# Patient Record
Sex: Female | Born: 1950 | Race: Black or African American | Hispanic: No | Smoking: Never smoker
Health system: Southern US, Community
[De-identification: ages and names within clinical notes are randomized; demographics above are authoritative.]

## PROBLEM LIST (undated history)

## (undated) DIAGNOSIS — T7840XA Allergy, unspecified, initial encounter: Secondary | ICD-10-CM

## (undated) DIAGNOSIS — I82402 Acute embolism and thrombosis of unspecified deep veins of left lower extremity: Secondary | ICD-10-CM

## (undated) DIAGNOSIS — I4891 Unspecified atrial fibrillation: Secondary | ICD-10-CM

## (undated) DIAGNOSIS — I10 Essential (primary) hypertension: Secondary | ICD-10-CM

## (undated) DIAGNOSIS — F419 Anxiety disorder, unspecified: Secondary | ICD-10-CM

## (undated) DIAGNOSIS — M5136 Other intervertebral disc degeneration, lumbar region: Secondary | ICD-10-CM

## (undated) DIAGNOSIS — E7800 Pure hypercholesterolemia, unspecified: Secondary | ICD-10-CM

## (undated) DIAGNOSIS — M51369 Other intervertebral disc degeneration, lumbar region without mention of lumbar back pain or lower extremity pain: Secondary | ICD-10-CM

## (undated) HISTORY — PX: PANCREAS SURGERY: SHX731

## (undated) HISTORY — PX: OTHER SURGICAL HISTORY: SHX169

## (undated) HISTORY — DX: Allergy, unspecified, initial encounter: T78.40XA

## (undated) HISTORY — DX: Acute embolism and thrombosis of unspecified deep veins of left lower extremity: I82.402

## (undated) HISTORY — DX: Anxiety disorder, unspecified: F41.9

---

## 2004-03-02 ENCOUNTER — Ambulatory Visit: Payer: Self-pay | Admitting: Internal Medicine

## 2004-03-03 ENCOUNTER — Ambulatory Visit: Payer: Self-pay | Admitting: Family Medicine

## 2004-03-07 ENCOUNTER — Ambulatory Visit: Payer: Self-pay | Admitting: *Deleted

## 2006-05-01 ENCOUNTER — Ambulatory Visit (HOSPITAL_COMMUNITY): Admission: RE | Admit: 2006-05-01 | Discharge: 2006-05-01 | Payer: Self-pay | Admitting: Family Medicine

## 2007-01-19 ENCOUNTER — Emergency Department (HOSPITAL_COMMUNITY): Admission: EM | Admit: 2007-01-19 | Discharge: 2007-01-19 | Payer: Self-pay | Admitting: Emergency Medicine

## 2007-11-20 ENCOUNTER — Emergency Department (HOSPITAL_COMMUNITY): Admission: EM | Admit: 2007-11-20 | Discharge: 2007-11-20 | Payer: Self-pay | Admitting: Emergency Medicine

## 2007-11-28 ENCOUNTER — Ambulatory Visit (HOSPITAL_COMMUNITY): Admission: RE | Admit: 2007-11-28 | Discharge: 2007-11-28 | Payer: Self-pay | Admitting: Family Medicine

## 2010-11-07 LAB — COMPREHENSIVE METABOLIC PANEL
ALT: 18
AST: 25
Albumin: 4.2
Alkaline Phosphatase: 122 — ABNORMAL HIGH
Calcium: 9.2
GFR calc non Af Amer: >60
Potassium: 3.2 — ABNORMAL LOW
Sodium: 140
Total Protein: 7.2

## 2010-11-07 LAB — URINALYSIS, ROUTINE W REFLEX MICROSCOPIC
Bilirubin Urine: NEGATIVE
Leukocytes, UA: NEGATIVE
Nitrite: NEGATIVE
Protein, ur: NEGATIVE
Urobilinogen, UA: 0.2
pH: 7.5

## 2010-11-07 LAB — CBC
HCT: 37.4
MCHC: 34.2
RDW: 13.1

## 2010-11-07 LAB — DIFFERENTIAL
Basophils Absolute: 0
Eosinophils Absolute: 0
Lymphocytes Relative: 18
Lymphs Abs: 1.7

## 2010-11-07 LAB — GLUCOSE, CAPILLARY

## 2010-11-07 LAB — URINE MICROSCOPIC-ADD ON

## 2010-11-14 LAB — DIFFERENTIAL
Eosinophils Absolute: 0 — ABNORMAL LOW
Lymphocytes Relative: 17
Monocytes Absolute: 0.4
Monocytes Relative: 4
Neutro Abs: 6.7

## 2010-11-14 LAB — BASIC METABOLIC PANEL
BUN: 11
CO2: 29
Calcium: 9.1
Creatinine, Ser: 0.76
GFR calc non Af Amer: >60
Glucose, Bld: 131 — ABNORMAL HIGH
Potassium: 3.2 — ABNORMAL LOW
Sodium: 138

## 2010-11-14 LAB — CBC
Hemoglobin: 12.1
MCV: 94.3
Platelets: 239
WBC: 8.7

## 2010-12-21 ENCOUNTER — Emergency Department (HOSPITAL_COMMUNITY)
Admission: EM | Admit: 2010-12-21 | Discharge: 2010-12-21 | Disposition: A | Discharge status: Home / Self Care | Payer: Self-pay | Attending: Emergency Medicine | Admitting: Emergency Medicine

## 2010-12-21 ENCOUNTER — Encounter: Payer: Self-pay | Admitting: *Deleted

## 2010-12-21 ENCOUNTER — Other Ambulatory Visit: Payer: Self-pay

## 2010-12-21 DIAGNOSIS — R42 Dizziness and giddiness: Secondary | ICD-10-CM | POA: Insufficient documentation

## 2010-12-21 DIAGNOSIS — E785 Hyperlipidemia, unspecified: Secondary | ICD-10-CM | POA: Insufficient documentation

## 2010-12-21 DIAGNOSIS — I10 Essential (primary) hypertension: Secondary | ICD-10-CM | POA: Insufficient documentation

## 2010-12-21 DIAGNOSIS — Z7982 Long term (current) use of aspirin: Secondary | ICD-10-CM | POA: Insufficient documentation

## 2010-12-21 HISTORY — DX: Pure hypercholesterolemia, unspecified: E78.00

## 2010-12-21 HISTORY — DX: Essential (primary) hypertension: I10

## 2010-12-21 LAB — DIFFERENTIAL
Basophils Relative: 0 % (ref 0–1)
Eosinophils Absolute: 0 10*3/uL (ref 0.0–0.7)
Lymphocytes Relative: 9 % — ABNORMAL LOW (ref 12–46)
Monocytes Relative: 4 % (ref 3–12)

## 2010-12-21 LAB — CBC
Hemoglobin: 12.2 g/dL (ref 12.0–15.0)
MCV: 96.3 fL (ref 78.0–100.0)

## 2010-12-21 LAB — COMPREHENSIVE METABOLIC PANEL
Albumin: 3.8 g/dL (ref 3.5–5.2)
Alkaline Phosphatase: 111 U/L (ref 39–117)
CO2: 30 mEq/L (ref 19–32)
Calcium: 9.7 mg/dL (ref 8.4–10.5)
GFR calc Af Amer: >90 mL/min (ref >90)
Glucose, Bld: 109 mg/dL — ABNORMAL HIGH (ref 70–99)
Potassium: 3.4 mEq/L — ABNORMAL LOW (ref 3.5–5.1)
Sodium: 138 mEq/L (ref 135–145)
Total Bilirubin: 0.7 mg/dL (ref 0.3–1.2)

## 2010-12-21 LAB — CK TOTAL AND CKMB (NOT AT ARMC): CK, MB: 2.5 ng/mL (ref 0.3–4.0)

## 2010-12-21 MED ORDER — MECLIZINE HCL 12.5 MG PO TABS
25.0000 mg | ORAL_TABLET | Freq: Once | ORAL | Status: AC
Start: 2010-12-21 — End: 2010-12-21
  Administered 2010-12-21: 25 mg via ORAL
  Filled 2010-12-21: qty 2

## 2010-12-21 MED ORDER — MECLIZINE HCL 50 MG PO TABS: 25.0000 mg | ORAL_TABLET | Freq: Three times a day (TID) | ORAL | Status: AC | PRN

## 2010-12-21 NOTE — ED Notes (Signed)
Pt states she fell this morning; pt states she lost her balance and fell; pt states she still feels like she is going to pass out now and she feels nauseous

## 2010-12-21 NOTE — ED Provider Notes (Signed)
History  This chart was scribed for Crystal Lyons, MD by Clarita Crane. The patient was seen in room APA11/APA11 and the patient's care was started at 11:04AM.  CSN: [id number] Arrival date & time: 12/21/2010 10:30 AM   First MD Initiated Contact with Patient 12/21/10 1057      Chief Complaint  Patient presents with  . Near Syncope    HPI  Crystal Cooke is a 60 y.o. female who presents to the Emergency Department complaining of near syncope that ocurred about 2 hours ago and lasted for about 10 minutes before resolving. She stated that she had just hung up the phone when she had a sudden onset of dizziness described as the room spinnnig, blurred vision, nausea and chills. She stated that she "collapsed to her knees and couldn't get up". She continued on to say that she crawled to her medicine drawer and took her blood pressure medication as well as an Asprin before crawling to her father's bedroom where she pulled herself up using a side of his bed. She stated that she had a headache prior to going to bed last night which she took 2 Tylenol for, but she denies any other associated symptoms such as tinnitus, loss of hearing, vomiting, diarrhea, or cold symptoms. She confirmed that she has a history of syncope and hyperlipidemia; however, she is noncompliant with taking her medication.     Past Medical History  Diagnosis Date  . Hypertension   . Hypercholesteremia     History reviewed. No pertinent past surgical history.  History reviewed. No pertinent family history.  History  Substance Use Topics  . Smoking status: Never Smoker   . Smokeless tobacco: Not on file  . Alcohol Use: No    OB History    Grav Para Term Preterm Abortions TAB SAB Ect Mult Living                  Review of Systems A complete 10 system review of systems was obtained and is otherwise negative except as noted in the HPI.  Allergies  Review of patient's allergies indicates no known allergies.  Home  Medications   Current Outpatient Rx  Name Route Sig Dispense Refill  . ACETAMINOPHEN 500 MG PO TABS Oral Take 1,000 mg by mouth every 6 (six) hours as needed. Pain     . ASPIRIN EC 81 MG PO TBEC Oral Take 81 mg by mouth daily.      Marland Kitchen DILTIAZEM HCL 120 MG PO TABS Oral Take 120 mg by mouth daily.      Marland Kitchen FISH OIL 1000 MG PO CAPS Oral Take 1 capsule by mouth daily.        BP 146/83  Pulse 84  Temp(Src) 98.4 F (36.9 C) (Oral)  Resp 20  Ht 5\' 2"  (1.575 m)  Wt 130 lb (58.968 kg)  BMI 23.78 kg/m2  SpO2 100%  Physical Exam  Nursing note and vitals reviewed. Constitutional: She is oriented to person, place, and time. She appears well-developed and well-nourished. No distress.  HENT:  Head: Normocephalic and atraumatic.  Right Ear: Tympanic membrane and external ear normal.  Left Ear: Tympanic membrane and external ear normal.  Mouth/Throat: Oropharynx is clear and moist.  Eyes: EOM are normal. Pupils are equal, round, and reactive to light.  Neck: Normal range of motion. Neck supple. No tracheal deviation present.  Cardiovascular: Normal rate and regular rhythm.  Exam reveals no gallop.   No murmur heard. Pulmonary/Chest: Effort normal and  breath sounds normal. No respiratory distress. She has no wheezes. She has no rales.       Clear bilaterally  Abdominal: She exhibits no distension.  Musculoskeletal: Normal range of motion.  Lymphadenopathy:    She has no cervical adenopathy.  Neurological: She is alert and oriented to person, place, and time. No cranial nerve deficit.  Skin: Skin is warm and dry.  Psychiatric: She has a normal mood and affect. Her behavior is normal.    ED Course  Procedures (including critical care time)  DIAGNOSTIC STUDIES: Oxygen Saturation is 100% on room air, normal by my interpretation.    COORDINATION OF CARE: 11:10AM- Discussed possible vertigo diagnosis, lab order and possible discharge with patient at bedside. Patient agreed to plan.   Labs  Reviewed  CBC - Abnormal; Notable for the following:    RBC 3.78 (*)    All other components within normal limits  DIFFERENTIAL - Abnormal; Notable for the following:    Neutrophils Relative 87 (*)    Lymphocytes Relative 9 (*)    All other components within normal limits  COMPREHENSIVE METABOLIC PANEL - Abnormal; Notable for the following:    Potassium 3.4 (*)    Glucose, Bld 109 (*)    GFR calc non Af Amer 88 (*)    All other components within normal limits  CK TOTAL AND CKMB  TROPONIN I   No results found.   No diagnosis found.   Date: 12/21/2010  Rate: 77  Rhythm: normal sinus rhythm  QRS Axis: normal  Intervals: normal  ST/T Wave abnormalities: normal  Conduction Disutrbances:none  Narrative Interpretation:   Old EKG Reviewed: unchanged    MDM  Symptoms sound like vertigo.  Labs, ekg okay.     I personally performed the services described in this documentation, which was scribed in my presence. The recorded information has been reviewed and considered.        Crystal Lyons, MD 12/21/10 [phone]

## 2011-08-08 ENCOUNTER — Other Ambulatory Visit (HOSPITAL_COMMUNITY): Payer: Self-pay | Admitting: Nurse Practitioner

## 2011-08-08 DIAGNOSIS — Z139 Encounter for screening, unspecified: Secondary | ICD-10-CM

## 2011-08-15 ENCOUNTER — Ambulatory Visit (HOSPITAL_COMMUNITY): Payer: Self-pay

## 2011-08-18 ENCOUNTER — Ambulatory Visit (HOSPITAL_COMMUNITY): Payer: Self-pay

## 2011-08-21 ENCOUNTER — Ambulatory Visit (HOSPITAL_COMMUNITY)
Admission: RE | Admit: 2011-08-21 | Discharge: 2011-08-21 | Disposition: A | Discharge status: Home / Self Care | Payer: PRIVATE HEALTH INSURANCE | Source: Ambulatory Visit | Attending: Nurse Practitioner | Admitting: Nurse Practitioner

## 2011-08-21 DIAGNOSIS — Z1231 Encounter for screening mammogram for malignant neoplasm of breast: Secondary | ICD-10-CM | POA: Insufficient documentation

## 2011-08-21 DIAGNOSIS — Z139 Encounter for screening, unspecified: Secondary | ICD-10-CM

## 2012-04-21 ENCOUNTER — Emergency Department (HOSPITAL_COMMUNITY)
Admission: EM | Admit: 2012-04-21 | Discharge: 2012-04-21 | Disposition: A | Discharge status: Home / Self Care | Payer: Self-pay | Attending: Emergency Medicine | Admitting: Emergency Medicine

## 2012-04-21 ENCOUNTER — Emergency Department (HOSPITAL_COMMUNITY): Payer: Self-pay

## 2012-04-21 ENCOUNTER — Encounter (HOSPITAL_COMMUNITY): Payer: Self-pay

## 2012-04-21 DIAGNOSIS — I10 Essential (primary) hypertension: Secondary | ICD-10-CM | POA: Insufficient documentation

## 2012-04-21 DIAGNOSIS — R079 Chest pain, unspecified: Secondary | ICD-10-CM

## 2012-04-21 DIAGNOSIS — R0789 Other chest pain: Secondary | ICD-10-CM | POA: Insufficient documentation

## 2012-04-21 DIAGNOSIS — E7800 Pure hypercholesterolemia, unspecified: Secondary | ICD-10-CM | POA: Insufficient documentation

## 2012-04-21 DIAGNOSIS — Z79899 Other long term (current) drug therapy: Secondary | ICD-10-CM | POA: Insufficient documentation

## 2012-04-21 DIAGNOSIS — R51 Headache: Secondary | ICD-10-CM | POA: Insufficient documentation

## 2012-04-21 DIAGNOSIS — Z7982 Long term (current) use of aspirin: Secondary | ICD-10-CM | POA: Insufficient documentation

## 2012-04-21 LAB — CBC WITH DIFFERENTIAL/PLATELET
Basophils Absolute: 0 K/uL (ref 0.0–0.1)
Basophils Relative: 0 % (ref 0–1)
Eosinophils Absolute: 0 K/uL (ref 0.0–0.7)
Eosinophils Relative: 1 % (ref 0–5)
HCT: 37.5 % (ref 36.0–46.0)
Hemoglobin: 12.8 g/dL (ref 12.0–15.0)
Lymphocytes Relative: 42 % (ref 12–46)
Lymphs Abs: 1.9 K/uL (ref 0.7–4.0)
MCH: 32.2 pg (ref 26.0–34.0)
MCHC: 34.1 g/dL (ref 30.0–36.0)
MCV: 94.5 fL (ref 78.0–100.0)
Monocytes Absolute: 0.3 K/uL (ref 0.1–1.0)
Monocytes Relative: 6 % (ref 3–12)
Neutro Abs: 2.4 K/uL (ref 1.7–7.7)
Neutrophils Relative %: 51 % (ref 43–77)
Platelets: 215 K/uL (ref 150–400)
RBC: 3.97 MIL/uL (ref 3.87–5.11)
RDW: 12.3 % (ref 11.5–15.5)
WBC: 4.7 K/uL (ref 4.0–10.5)

## 2012-04-21 NOTE — ED Notes (Addendum)
Pt presents with hypertension. Pt took BP at home and it was elevated. Pt took medicine and BP went down. Pt also stating that she was feelings some dull pain and discomfort in her chest.

## 2012-04-21 NOTE — ED Provider Notes (Signed)
History  This chart was scribed for Vida Roller, MD by Bennett Scrape, ED Scribe. This patient was seen in room APA06/APA06 and the patient's care was started at 4:24 PM.  CSN: [id number]  Arrival date & time 04/21/12  1417   First MD Initiated Contact with Patient 04/21/12 1624      Chief Complaint  Patient presents with  . Hypertension    The history is provided by the patient. No language interpreter was used.    Crystal Cooke is a 62 y.o. female with a h/o HTN who presents to the Emergency Department complaining of gradual onset, waxing and waning HTN with associated slight HA and chest heaviness that she noticed today. Pt states that she measured her BP at 177/96, pulse was 74. She reports that she then took her cardizem medication and took her BP again one hour later with a systolic BP of 94. She then took her BP again one hour later with a systolic BP of 106. She then decided to come to the ED for evaluation. She denies having a HA currently and reports that the chest heaviness has improved. She reports that she has just recently started taking her BP at home with a wrist machine. She has been on Cardizem medication for 10 years and she denies being on any other medications. She states that when she follows up with the health clinic for her PB she is told that her BP is normal. She denies having a h/o MI, CVA and kidney problems. She denies fever, leg swelling, nausea, emesis and dizziness as associated symptoms. She denies smoking and alcohol use.  Past Medical History  Diagnosis Date  . Hypertension   . Hypercholesteremia     History reviewed. No pertinent past surgical history.  No family history on file.  History  Substance Use Topics  . Smoking status: Never Smoker   . Smokeless tobacco: Not on file  . Alcohol Use: No    No OB history provided.  Review of Systems  Constitutional: Negative for fever.  Respiratory: Positive for chest tightness. Negative for  cough.   Cardiovascular: Negative for leg swelling.  Gastrointestinal: Negative for nausea, vomiting and diarrhea.  Neurological: Positive for headaches. Negative for dizziness.  All other systems reviewed and are negative.    Allergies  Review of patient's allergies indicates no known allergies.  Home Medications   Current Outpatient Rx  Name  Route  Sig  Dispense  Refill  . aspirin EC 81 MG tablet   Oral   Take 81 mg by mouth daily.           Marland Kitchen diltiazem (CARDIZEM) 120 MG tablet   Oral   Take 120 mg by mouth daily.           . Omega-3 Fatty Acids (FISH OIL) 1000 MG CAPS   Oral   Take 1 capsule by mouth daily.           Marland Kitchen acetaminophen (TYLENOL) 500 MG tablet   Oral   Take 1,000 mg by mouth every 6 (six) hours as needed. Pain            Triage Vitals: BP 174/95  Pulse 85  Temp(Src) 99 F (37.2 C)  Resp 16  Ht 5\' 1"  (1.549 m)  Wt 130 lb (58.968 kg)  BMI 24.58 kg/m2  SpO2 100%  Physical Exam  Nursing note and vitals reviewed. Constitutional: She is oriented to person, place, and time. She appears well-developed and  well-nourished.  Non-toxic appearance. She does not appear ill. No distress.  HENT:  Head: Normocephalic and atraumatic.  Nose: No mucosal edema or rhinorrhea.  Mouth/Throat: Oropharynx is clear and moist and mucous membranes are normal. No dental abscesses or edematous.  Eyes: Conjunctivae and EOM are normal. Pupils are equal, round, and reactive to light.  Neck: Normal range of motion and full passive range of motion without pain. Neck supple.  Cardiovascular: Normal rate, regular rhythm and normal heart sounds.  Exam reveals no gallop and no friction rub.   No murmur heard. Pulmonary/Chest: Effort normal and breath sounds normal. No respiratory distress. She has no wheezes. She has no rhonchi. She has no rales. She exhibits no tenderness and no crepitus.  Abdominal: Soft. Normal appearance and bowel sounds are normal. She exhibits no  distension. There is no tenderness. There is no rebound and no guarding.  Musculoskeletal: Normal range of motion. She exhibits no edema and no tenderness.  Moves all extremities well.   Neurological: She is alert and oriented to person, place, and time. She has normal strength. No cranial nerve deficit.  Skin: Skin is warm, dry and intact.  Psychiatric: She has a normal mood and affect. Her speech is normal and behavior is normal. Her mood appears not anxious.    ED Course  Procedures (including critical care time)  DIAGNOSTIC STUDIES: Oxygen Saturation is 100% on room air, normal by my interpretation.    COORDINATION OF CARE: 4:31 PM- Advised pt to relax and will retake BP. Informed pt that wrist BPs are usually inaccurate. Will have pt test with her wrist machine and compare with our BP cuff. Discussed treatment plan which includes CXR, CBC panel and troponin with pt at bedside and pt agreed to plan.   Labs Reviewed  CBC WITH DIFFERENTIAL  POCT I-STAT TROPONIN I   Dg Chest Port 1 View  04/21/2012  *RADIOLOGY REPORT*  Clinical Data: Chest pain.  Shortness of breath.  Current history of hypertension.  PORTABLE CHEST - 1 VIEW 04/21/2012 1637 hours:  Comparison: Two-view chest x-ray 11/20/2007.  Findings: Suboptimal inspiration accounts for mild atelectasis at the left lung base.  Lungs otherwise clear.  Cardiac silhouette upper normal in size for technique, unchanged.  Hilar and mediastinal contours otherwise unremarkable.  IMPRESSION: Suboptimal inspiration accounts for mild left basilar atelectasis. No acute cardiopulmonary disease otherwise.   Original Report Authenticated By: Hulan Saas, M.D.      1. Hypertension   2. Chest pain       MDM  On repeat exam, BP is 150/100 per tech, has normal labs and normal ECG - doubt cardiac crisis, htn is tolerable and can be rechecked at her PMD's no need for treatment in the ED, pt is well appearing and doubt ACS.  ED ECG REPORT  I  personally interpreted this EKG   Date: 04/21/2012   Rate: 78  Rhythm: normal sinus rhythm  QRS Axis: normal  Intervals: normal  ST/T Wave abnormalities: normal  Conduction Disutrbances:none  Narrative Interpretation:   Old EKG Reviewed: none available    I personally performed the services described in this documentation, which was scribed in my presence. The recorded information has been reviewed and is accurate.      Vida Roller, MD 04/21/12 [phone]

## 2012-04-22 LAB — POCT I-STAT, CHEM 8
BUN: 14 mg/dL (ref 6–23)
Chloride: 104 mEq/L (ref 96–112)
Creatinine, Ser: 0.9 mg/dL (ref 0.50–1.10)
Sodium: 142 mEq/L (ref 135–145)
TCO2: 29 mmol/L (ref 0–100)

## 2012-05-14 ENCOUNTER — Encounter (HOSPITAL_COMMUNITY): Payer: Self-pay | Admitting: *Deleted

## 2012-05-14 ENCOUNTER — Emergency Department (HOSPITAL_COMMUNITY)
Admission: EM | Admit: 2012-05-14 | Discharge: 2012-05-14 | Disposition: A | Discharge status: Home / Self Care | Payer: Self-pay | Attending: Emergency Medicine | Admitting: Emergency Medicine

## 2012-05-14 DIAGNOSIS — H1130 Conjunctival hemorrhage, unspecified eye: Secondary | ICD-10-CM | POA: Insufficient documentation

## 2012-05-14 DIAGNOSIS — Z79899 Other long term (current) drug therapy: Secondary | ICD-10-CM | POA: Insufficient documentation

## 2012-05-14 DIAGNOSIS — Z8639 Personal history of other endocrine, nutritional and metabolic disease: Secondary | ICD-10-CM | POA: Insufficient documentation

## 2012-05-14 DIAGNOSIS — I10 Essential (primary) hypertension: Secondary | ICD-10-CM | POA: Insufficient documentation

## 2012-05-14 DIAGNOSIS — R51 Headache: Secondary | ICD-10-CM | POA: Insufficient documentation

## 2012-05-14 DIAGNOSIS — H1131 Conjunctival hemorrhage, right eye: Secondary | ICD-10-CM

## 2012-05-14 DIAGNOSIS — Z7982 Long term (current) use of aspirin: Secondary | ICD-10-CM | POA: Insufficient documentation

## 2012-05-14 DIAGNOSIS — Z862 Personal history of diseases of the blood and blood-forming organs and certain disorders involving the immune mechanism: Secondary | ICD-10-CM | POA: Insufficient documentation

## 2012-05-14 LAB — GLUCOSE, CAPILLARY: Glucose-Capillary: 100 mg/dL — ABNORMAL HIGH (ref 70–99)

## 2012-05-14 NOTE — ED Notes (Signed)
Pt c/o of ha that started yesterday and redness to rt eye that was there when she woke this morning. Denies eye pain. Redness noted to sclera. No edema.

## 2012-05-14 NOTE — ED Provider Notes (Signed)
History     CSN: [id number]  Arrival date & time 05/14/12  9147   First MD Initiated Contact with Patient 05/14/12 0940      Chief Complaint  Patient presents with  . Eye Pain  . Headache    (Consider location/radiation/quality/duration/timing/severity/associated sxs/prior treatment) HPI Comments: Patient c/o generalized headache that began on the day prior to Ed arrival.  Described the headache as gradual in onset with a throbbing sensation around the top of her head.  States she took an OTC pain reliever and when she woke up this morning the headache had resolved, but states she noticed redness to the right eye and states she was concerned she might be having symptoms of a stroke.  She also admits to recent stressors in her life and not sleeping well.  She denies eye pain, visual changes, vomiting, neck pain, numbness or weakness of the face or extremities, speech difficulty or chest pain.  She also denies facial trauma or contact use.    Patient is a 62 y.o. female presenting with eye problem. The history is provided by the patient.  Eye Problem Location:  R eye Quality: no pain. Severity:  Mild Onset quality:  Sudden Duration: woke this morning with redness to her right eye. Timing:  Constant Progression:  Unchanged Chronicity:  New Context: not burn, not chemical exposure, not contact lens problem, not direct trauma, not foreign body, not using machinery, not scratch, not smoke exposure and not tanning booth use   Relieved by:  Nothing Worsened by:  Nothing tried Ineffective treatments:  None tried Associated symptoms: headaches and redness   Associated symptoms: no blurred vision, no crusting, no decreased vision, no discharge, no double vision, no facial rash, no foreign body sensation, no inflammation, no itching, no nausea, no numbness, no photophobia, no scotomas, no swelling, no tearing, no tingling, no vomiting and no weakness     Past Medical History  Diagnosis Date    . Hypertension   . Hypercholesteremia     History reviewed. No pertinent past surgical history.  No family history on file.  History  Substance Use Topics  . Smoking status: Never Smoker   . Smokeless tobacco: Not on file  . Alcohol Use: No    OB History   Grav Para Term Preterm Abortions TAB SAB Ect Mult Living                  Review of Systems  Constitutional: Negative for fever, activity change and appetite change.  HENT: Negative for facial swelling, trouble swallowing, neck pain and neck stiffness.   Eyes: Positive for redness. Negative for blurred vision, double vision, photophobia, pain, discharge, itching and visual disturbance.  Respiratory: Negative for chest tightness and shortness of breath.   Cardiovascular: Negative for chest pain.  Gastrointestinal: Negative for nausea and vomiting.  Skin: Negative for rash and wound.  Neurological: Positive for headaches. Negative for dizziness, tingling, syncope, facial asymmetry, speech difficulty, weakness, light-headedness and numbness.  Psychiatric/Behavioral: Negative for confusion and decreased concentration.  All other systems reviewed and are negative.    Allergies  Review of patient's allergies indicates no known allergies.  Home Medications   Current Outpatient Rx  Name  Route  Sig  Dispense  Refill  . acetaminophen (TYLENOL) 500 MG tablet   Oral   Take 1,000 mg by mouth every 6 (six) hours as needed. Pain          . aspirin EC 81 MG tablet  Oral   Take 81 mg by mouth daily.           Marland Kitchen diltiazem (CARDIZEM) 120 MG tablet   Oral   Take 120 mg by mouth daily.           . Omega-3 Fatty Acids (FISH OIL) 1000 MG CAPS   Oral   Take 1 capsule by mouth daily.             BP 116/90  Pulse 100  Temp(Src) 98.2 F (36.8 C) (Oral)  Resp 15  Ht 5\' 1"  (1.549 m)  Wt 130 lb (58.968 kg)  BMI 24.58 kg/m2  SpO2 96%  Physical Exam  Nursing note and vitals reviewed. Constitutional: She is  oriented to person, place, and time. She appears well-developed and well-nourished. No distress.  HENT:  Head: Normocephalic and atraumatic.  Mouth/Throat: Oropharynx is clear and moist.  Eyes: EOM are normal. Pupils are equal, round, and reactive to light. No foreign bodies found. Right eye exhibits no chemosis, no discharge and no exudate. No foreign body present in the right eye. Left eye exhibits no chemosis, no discharge and no exudate. No foreign body present in the left eye. Right conjunctiva has a hemorrhage.    Neck: Normal range of motion and phonation normal. Neck supple. No rigidity. No Brudzinski's sign and no Kernig's sign noted.  Cardiovascular: Normal rate, regular rhythm, normal heart sounds and intact distal pulses.   No murmur heard. Pulmonary/Chest: Effort normal and breath sounds normal.  Musculoskeletal: Normal range of motion.  Neurological: She is alert and oriented to person, place, and time. No cranial nerve deficit or sensory deficit. She exhibits normal muscle tone. Coordination and gait normal.  Reflex Scores:      Tricep reflexes are 2+ on the right side and 2+ on the left side.      Bicep reflexes are 2+ on the right side and 2+ on the left side. Skin: Skin is warm and dry.    ED Course  Procedures (including critical care time)  Labs Reviewed  GLUCOSE, CAPILLARY - Abnormal; Notable for the following:    Glucose-Capillary 100 (*)    All other components within normal limits        MDM    Vitals stable,  Pt is non-toxic appearing.  No visual changes, no focal neuro deficits, no meningeal signs.  Headache of gradual onset yesterday that is similar to previous and resolved this morning PTA. Has a subconjunctival hemorrhage to medial aspect of the sclera right eye . No visual deficits, EOM's intact.   Patient agrees to close f/u with PMD or to return here if the symptoms worsen.   The patient appears reasonably screened and/or stabilized for discharge  and I doubt any other medical condition or other [facility] requiring further screening, evaluation, or treatment in the ED at this time prior to discharge.       Yarieliz Wasser L. Trisha Mangle, PA-C 05/15/12 1956

## 2012-05-16 NOTE — ED Provider Notes (Signed)
Medical screening examination/treatment/procedure(s) were performed by non-physician practitioner and as supervising physician I was immediately available for consultation/collaboration.  Donnetta Hutching, MD 05/16/12 [phone]

## 2012-08-02 ENCOUNTER — Encounter (HOSPITAL_COMMUNITY): Payer: Self-pay | Admitting: *Deleted

## 2012-08-02 ENCOUNTER — Emergency Department (HOSPITAL_COMMUNITY)
Admission: EM | Admit: 2012-08-02 | Discharge: 2012-08-02 | Disposition: A | Discharge status: Home / Self Care | Payer: Self-pay | Attending: Emergency Medicine | Admitting: Emergency Medicine

## 2012-08-02 DIAGNOSIS — I10 Essential (primary) hypertension: Secondary | ICD-10-CM | POA: Insufficient documentation

## 2012-08-02 DIAGNOSIS — E7800 Pure hypercholesterolemia, unspecified: Secondary | ICD-10-CM | POA: Insufficient documentation

## 2012-08-02 DIAGNOSIS — Z7982 Long term (current) use of aspirin: Secondary | ICD-10-CM | POA: Insufficient documentation

## 2012-08-02 DIAGNOSIS — B029 Zoster without complications: Secondary | ICD-10-CM | POA: Insufficient documentation

## 2012-08-02 DIAGNOSIS — Z79899 Other long term (current) drug therapy: Secondary | ICD-10-CM | POA: Insufficient documentation

## 2012-08-02 MED ORDER — DEXAMETHASONE SODIUM PHOSPHATE 4 MG/ML IJ SOLN
8.0000 mg | Freq: Once | INTRAMUSCULAR | Status: AC
  Administered 2012-08-02: 8 mg via INTRAMUSCULAR
  Filled 2012-08-02: qty 2

## 2012-08-02 MED ORDER — HYDROCODONE-ACETAMINOPHEN 5-325 MG PO TABS: 1.0000 | ORAL_TABLET | ORAL | Status: DC | PRN

## 2012-08-02 MED ORDER — ACYCLOVIR 800 MG PO TABS
800.0000 mg | ORAL_TABLET | Freq: Once | ORAL | Status: AC
  Administered 2012-08-02: 800 mg via ORAL
  Filled 2012-08-02: qty 1

## 2012-08-02 MED ORDER — ONDANSETRON HCL 4 MG PO TABS
4.0000 mg | ORAL_TABLET | Freq: Once | ORAL | Status: DC
  Filled 2012-08-02: qty 1

## 2012-08-02 MED ORDER — ACYCLOVIR 800 MG PO TABS: 800.0000 mg | ORAL_TABLET | Freq: Every day | ORAL | Status: DC

## 2012-08-02 MED ORDER — DEXAMETHASONE 4 MG PO TABS: ORAL_TABLET | ORAL | Status: DC

## 2012-08-02 MED ORDER — HYDROCODONE-ACETAMINOPHEN 5-325 MG PO TABS
2.0000 | ORAL_TABLET | Freq: Once | ORAL | Status: DC
  Filled 2012-08-02: qty 2

## 2012-08-02 NOTE — ED Provider Notes (Signed)
Medical screening examination/treatment/procedure(s) were performed by non-physician practitioner and as supervising physician I was immediately available for consultation/collaboration.   Leighton Luster L Kester Stimpson, MD 08/02/12 1500 

## 2012-08-02 NOTE — ED Provider Notes (Signed)
History    CSN: [id number] Arrival date & time 08/02/12  1212  First MD Initiated Contact with Patient 08/02/12 1238     Chief Complaint  Patient presents with  . Rash   (Consider location/radiation/quality/duration/timing/severity/associated sxs/prior Treatment) Patient is a 62 y.o. female presenting with rash. The history is provided by the patient.  Rash Pain location: right forehead. Pain quality: aching and sharp   Pain quality comment:  Burning sensation Pain radiation: scalp. Pain severity:  Moderate Onset quality:  Gradual Duration:  5 days Timing:  Constant Progression:  Worsening Chronicity:  New Context: not diet changes, not recent travel and not sick contacts   Relieved by:  Nothing Worsened by:  Palpation Ineffective treatments: steroid cream. Associated symptoms: no chest pain, no chills, no cough, no dysuria, no fever, no hematuria and no shortness of breath    Past Medical History  Diagnosis Date  . Hypertension   . Hypercholesteremia    History reviewed. No pertinent past surgical history. History reviewed. No pertinent family history. History  Substance Use Topics  . Smoking status: Never Smoker   . Smokeless tobacco: Not on file  . Alcohol Use: No   OB History   Grav Para Term Preterm Abortions TAB SAB Ect Mult Living                 Review of Systems  Constitutional: Negative for fever, chills and activity change.       All ROS Neg except as noted in HPI  HENT: Negative for nosebleeds and neck pain.   Eyes: Negative for photophobia and discharge.  Respiratory: Negative for cough, shortness of breath and wheezing.   Cardiovascular: Negative for chest pain and palpitations.  Gastrointestinal: Negative for abdominal pain and blood in stool.  Genitourinary: Negative for dysuria, frequency and hematuria.  Musculoskeletal: Negative for back pain and arthralgias.  Skin: Positive for rash.  Neurological: Negative for dizziness, seizures and  speech difficulty.  Psychiatric/Behavioral: Negative for hallucinations and confusion.    Allergies  Review of patient's allergies indicates no known allergies.  Home Medications   Current Outpatient Rx  Name  Route  Sig  Dispense  Refill  . aspirin EC 81 MG tablet   Oral   Take 81 mg by mouth daily.           Marland Kitchen diltiazem (CARDIZEM) 120 MG tablet   Oral   Take 120 mg by mouth daily.           . Omega-3 Fatty Acids (FISH OIL) 1000 MG CAPS   Oral   Take 1 capsule by mouth daily.           Marland Kitchen sulfamethoxazole-trimethoprim (BACTRIM DS) 800-160 MG per tablet   Oral   Take 1 tablet by mouth 2 (two) times daily.          BP 125/76  Pulse 87  Temp(Src) 99.2 F (37.3 C) (Oral)  Resp 20  Ht 5\' 1"  (1.549 m)  Wt 130 lb (58.968 kg)  BMI 24.58 kg/m2  SpO2 99% Physical Exam  Nursing note and vitals reviewed. Constitutional: She is oriented to person, place, and time. She appears well-developed and well-nourished.  Non-toxic appearance.  HENT:  Head: Normocephalic.    Right Ear: Tympanic membrane and external ear normal.  Left Ear: Tympanic membrane and external ear normal.  Eyes: EOM and lids are normal. Pupils are equal, round, and reactive to light.  Neck: Normal range of motion. Neck supple. Carotid bruit is  not present.  Cardiovascular: Normal rate, regular rhythm, normal heart sounds, intact distal pulses and normal pulses.   Pulmonary/Chest: Breath sounds normal. No respiratory distress.  Abdominal: Soft. Bowel sounds are normal. There is no tenderness. There is no guarding.  Musculoskeletal: Normal range of motion.  Lymphadenopathy:       Head (right side): No submandibular adenopathy present.       Head (left side): No submandibular adenopathy present.    She has no cervical adenopathy.  Neurological: She is alert and oriented to person, place, and time. She has normal strength. No cranial nerve deficit or sensory deficit.  Skin: Skin is warm and dry.    Psychiatric: She has a normal mood and affect. Her speech is normal.    ED Course  Procedures (including critical care time) Labs Reviewed - No data to display No results found. No diagnosis found.  MDM  I have reviewed nursing notes, vital signs, and all appropriate lab and imaging results for this patient. Examination is c/w herpes zoster. Pt admits to chicken pox as a child. Plan - acyclovir, decadron, and norco. Follow up with MD at the clinic.  Kathie Dike, PA-C 08/02/12 1352

## 2012-08-02 NOTE — ED Notes (Addendum)
Pt says she was bitten by mosquito on Sunday, seen at health dept on Wed, Cont to have rash and pain.Also started being treated for uti

## 2012-08-02 NOTE — ED Notes (Signed)
Pt c/o rash and pain to right side of facial area started Sunday after being bitten by insect while mowing the yard, was seen at health department, given hydrocortisone and medication for uti. Pt states that the uti symptoms are better.

## 2012-08-12 ENCOUNTER — Emergency Department (HOSPITAL_COMMUNITY)
Admission: EM | Admit: 2012-08-12 | Discharge: 2012-08-12 | Disposition: A | Discharge status: Home / Self Care | Payer: Self-pay | Attending: Emergency Medicine | Admitting: Emergency Medicine

## 2012-08-12 ENCOUNTER — Encounter (HOSPITAL_COMMUNITY): Payer: Self-pay | Admitting: *Deleted

## 2012-08-12 DIAGNOSIS — J029 Acute pharyngitis, unspecified: Secondary | ICD-10-CM | POA: Insufficient documentation

## 2012-08-12 DIAGNOSIS — Z79899 Other long term (current) drug therapy: Secondary | ICD-10-CM | POA: Insufficient documentation

## 2012-08-12 DIAGNOSIS — T375X5A Adverse effect of antiviral drugs, initial encounter: Secondary | ICD-10-CM | POA: Insufficient documentation

## 2012-08-12 DIAGNOSIS — E7800 Pure hypercholesterolemia, unspecified: Secondary | ICD-10-CM | POA: Insufficient documentation

## 2012-08-12 DIAGNOSIS — Z789 Other specified health status: Secondary | ICD-10-CM

## 2012-08-12 DIAGNOSIS — R220 Localized swelling, mass and lump, head: Secondary | ICD-10-CM | POA: Insufficient documentation

## 2012-08-12 DIAGNOSIS — I10 Essential (primary) hypertension: Secondary | ICD-10-CM | POA: Insufficient documentation

## 2012-08-12 DIAGNOSIS — K1370 Unspecified lesions of oral mucosa: Secondary | ICD-10-CM | POA: Insufficient documentation

## 2012-08-12 DIAGNOSIS — Z7982 Long term (current) use of aspirin: Secondary | ICD-10-CM | POA: Insufficient documentation

## 2012-08-12 MED ORDER — NYSTATIN 100000 UNIT/ML MT SUSP: OROMUCOSAL | Status: DC

## 2012-08-12 MED ORDER — DIPHENHYDRAMINE HCL 25 MG PO TABS: 25.0000 mg | ORAL_TABLET | Freq: Four times a day (QID) | ORAL | Status: DC

## 2012-08-12 NOTE — ED Notes (Signed)
Pt states she thinks she is having a medication reaction to acyclovir. Pt states she feels like she has a lump in her throat when she swallows, has white stuff in her mouth, and has been feeling lightheaded.

## 2012-08-12 NOTE — ED Provider Notes (Signed)
History    CSN: [id number] Arrival date & time 08/12/12  0016  First MD Initiated Contact with Patient 08/12/12 0038     Chief Complaint  Patient presents with  . Medication Reaction   (Consider location/radiation/quality/duration/timing/severity/associated sxs/prior Treatment) HPI Comments: Crystal Cooke is a 62 y.o. female who presents to the Emergency Department complaining of sore throat and rash to her mouth.  States she was seen here more than one week ago and treated with acyclovir and prednisone for shingles.  Patient states she believes she is having a reaction to those medications.  She states she never "broke out" with any rash and her previous symptoms have since resolved.  She now states she has "white stuff" in her throat and on her tongue with what she describes as a "lump" in her throat when she swallows.  She denies fever, vomiting, facial swelling, or difficulty swallowing fluids.    The history is provided by the patient.   Past Medical History  Diagnosis Date  . Hypertension   . Hypercholesteremia    History reviewed. No pertinent past surgical history. History reviewed. No pertinent family history. History  Substance Use Topics  . Smoking status: Never Smoker   . Smokeless tobacco: Not on file  . Alcohol Use: No   OB History   Grav Para Term Preterm Abortions TAB SAB Ect Mult Living                 Review of Systems  Constitutional: Negative for fever, chills, activity change and appetite change.  HENT: Positive for sore throat and mouth sores. Negative for ear pain, congestion, facial swelling, drooling, trouble swallowing, neck pain, neck stiffness and voice change.   Eyes: Negative for pain and visual disturbance.  Respiratory: Negative for cough, chest tightness, shortness of breath and stridor.   Gastrointestinal: Negative for nausea, vomiting and abdominal pain.  Musculoskeletal: Negative for arthralgias.  Skin: Negative for color change and  rash.  Neurological: Negative for dizziness, facial asymmetry, speech difficulty, numbness and headaches.  Hematological: Negative for adenopathy.  All other systems reviewed and are negative.    Allergies  Review of patient's allergies indicates no known allergies.  Home Medications   Current Outpatient Rx  Name  Route  Sig  Dispense  Refill  . acyclovir (ZOVIRAX) 800 MG tablet   Oral   Take 1 tablet (800 mg total) by mouth 5 (five) times daily.   35 tablet   0   . HYDROcodone-acetaminophen (NORCO/VICODIN) 5-325 MG per tablet   Oral   Take 1 tablet by mouth every 4 (four) hours as needed for pain.   20 tablet   0   . aspirin EC 81 MG tablet   Oral   Take 81 mg by mouth daily.           Marland Kitchen diltiazem (CARDIZEM) 120 MG tablet   Oral   Take 120 mg by mouth daily.           . Omega-3 Fatty Acids (FISH OIL) 1000 MG CAPS   Oral   Take 1 capsule by mouth daily.           Marland Kitchen sulfamethoxazole-trimethoprim (BACTRIM DS) 800-160 MG per tablet   Oral   Take 1 tablet by mouth 2 (two) times daily.          BP 127/81  Temp(Src) 98.3 F (36.8 C) (Oral)  Resp 20  Ht 5\' 1"  (1.549 m)  Wt 130 lb (58.968 kg)  BMI 24.58 kg/m2  SpO2 98% Physical Exam  Nursing note and vitals reviewed. Constitutional: She is oriented to person, place, and time. She appears well-developed and well-nourished. No distress.  HENT:  Head: Normocephalic and atraumatic. No trismus in the jaw.  Right Ear: Tympanic membrane and ear canal normal.  Left Ear: Tympanic membrane and ear canal normal.  Mouth/Throat: Uvula is midline and mucous membranes are normal. No edematous. Posterior oropharyngeal erythema present. No posterior oropharyngeal edema or tonsillar abscesses.  Erythema and lengthening of the uvula.  No edema.  Uvula remains midline.  White exudate to the tongue and buccal mucosa that is easily scraped away with tongue blade  Neck: Normal range of motion, full passive range of motion without  pain and phonation normal. Neck supple.  Cardiovascular: Normal rate, regular rhythm, normal heart sounds and intact distal pulses.   No murmur heard. Pulmonary/Chest: Effort normal and breath sounds normal. No stridor. No respiratory distress. She has no wheezes. She has no rales.  Musculoskeletal: Normal range of motion.  Lymphadenopathy:    She has no cervical adenopathy.  Neurological: She is alert and oriented to person, place, and time. She exhibits normal muscle tone. Coordination normal.  Skin: Skin is warm and dry.    ED Course  Procedures (including critical care time) Labs Reviewed - No data to display    MDM   Previous ED chart reviewed.    Patient seen here 9 days ago and treated with decadron and acyclovir for zoster.  Tonight has erythema and thrush to the oral mucosa with lengthening of the uvula.  Uvula is NOT edematous.  Airway is patent, VSS.  NO Zoster lesions  She appears stable for discharge, advised to d/c the medication, will prescribe nystatin swish and swallow and benadryl.  She agrees to return here if needed.    Sayre Witherington L. Trisha Mangle, PA-C 08/14/12 2130

## 2012-08-17 NOTE — ED Provider Notes (Signed)
Medical screening examination/treatment/procedure(s) were performed by non-physician practitioner and as supervising physician I was immediately available for consultation/collaboration.  Joanathan Affeldt S. Jewelz Ricklefs, MD 08/17/12 0501 

## 2012-09-22 ENCOUNTER — Emergency Department (HOSPITAL_COMMUNITY): Payer: Self-pay

## 2012-09-22 ENCOUNTER — Encounter (HOSPITAL_COMMUNITY): Payer: Self-pay | Admitting: Emergency Medicine

## 2012-09-22 ENCOUNTER — Emergency Department (HOSPITAL_COMMUNITY)
Admission: EM | Admit: 2012-09-22 | Discharge: 2012-09-22 | Disposition: A | Discharge status: Home / Self Care | Payer: Self-pay | Attending: Emergency Medicine | Admitting: Emergency Medicine

## 2012-09-22 DIAGNOSIS — N23 Unspecified renal colic: Secondary | ICD-10-CM | POA: Insufficient documentation

## 2012-09-22 DIAGNOSIS — N201 Calculus of ureter: Secondary | ICD-10-CM | POA: Insufficient documentation

## 2012-09-22 DIAGNOSIS — Z7982 Long term (current) use of aspirin: Secondary | ICD-10-CM | POA: Insufficient documentation

## 2012-09-22 DIAGNOSIS — Z79899 Other long term (current) drug therapy: Secondary | ICD-10-CM | POA: Insufficient documentation

## 2012-09-22 DIAGNOSIS — R350 Frequency of micturition: Secondary | ICD-10-CM | POA: Insufficient documentation

## 2012-09-22 DIAGNOSIS — M5137 Other intervertebral disc degeneration, lumbosacral region: Secondary | ICD-10-CM | POA: Insufficient documentation

## 2012-09-22 DIAGNOSIS — I4891 Unspecified atrial fibrillation: Secondary | ICD-10-CM | POA: Insufficient documentation

## 2012-09-22 DIAGNOSIS — M51379 Other intervertebral disc degeneration, lumbosacral region without mention of lumbar back pain or lower extremity pain: Secondary | ICD-10-CM | POA: Insufficient documentation

## 2012-09-22 DIAGNOSIS — I10 Essential (primary) hypertension: Secondary | ICD-10-CM | POA: Insufficient documentation

## 2012-09-22 DIAGNOSIS — E7800 Pure hypercholesterolemia, unspecified: Secondary | ICD-10-CM | POA: Insufficient documentation

## 2012-09-22 HISTORY — DX: Unspecified atrial fibrillation: I48.91

## 2012-09-22 HISTORY — DX: Other intervertebral disc degeneration, lumbar region without mention of lumbar back pain or lower extremity pain: M51.369

## 2012-09-22 HISTORY — DX: Other intervertebral disc degeneration, lumbar region: M51.36

## 2012-09-22 LAB — URINALYSIS W MICROSCOPIC + REFLEX CULTURE
Glucose, UA: NEGATIVE mg/dL
Ketones, ur: NEGATIVE mg/dL
Leukocytes, UA: NEGATIVE
Nitrite: NEGATIVE
Specific Gravity, Urine: 1.02 (ref 1.005–1.030)
pH: 6.5 (ref 5.0–8.0)

## 2012-09-22 MED ORDER — ACETAMINOPHEN 500 MG PO TABS
1000.0000 mg | ORAL_TABLET | Freq: Once | ORAL | Status: AC
  Administered 2012-09-22: 1000 mg via ORAL
  Filled 2012-09-22: qty 2

## 2012-09-22 MED ORDER — NAPROXEN 250 MG PO TABS: 250.0000 mg | ORAL_TABLET | Freq: Two times a day (BID) | ORAL | Status: DC

## 2012-09-22 MED ORDER — OXYCODONE-ACETAMINOPHEN 5-325 MG PO TABS: ORAL_TABLET | ORAL | Status: DC

## 2012-09-22 MED ORDER — IBUPROFEN 400 MG PO TABS
400.0000 mg | ORAL_TABLET | Freq: Once | ORAL | Status: AC
  Administered 2012-09-22: 400 mg via ORAL
  Filled 2012-09-22: qty 1

## 2012-09-22 MED ORDER — BACITRACIN ZINC 500 UNIT/GM EX OINT
TOPICAL_OINTMENT | CUTANEOUS | Status: AC
  Filled 2012-09-22: qty 0.9

## 2012-09-22 MED ORDER — ONDANSETRON HCL 4 MG PO TABS: 4.0000 mg | ORAL_TABLET | Freq: Three times a day (TID) | ORAL | Status: DC | PRN

## 2012-09-22 NOTE — ED Provider Notes (Signed)
CSN: [id number]     Arrival date & time 09/22/12  1325 History     First MD Initiated Contact with Patient 09/22/12 1337     Chief Complaint  Patient presents with  . Back Pain    HPI Pt was seen at 1335. Per pt, c/o gradual onset and persistence of constant right sided low back "pain" for the past 1 week.  Pain worsens with palpation of the area and body position changes. Has been associated with urinary frequency. Denies dysuria/hematuria, no vaginal bleeding/discharge.  Denies incont/retention of bowel or bladder, no saddle anesthesia, no focal motor weakness, no tingling/numbness in extremities, no fevers, no injury, no abd pain.     Past Medical History  Diagnosis Date  . Hypertension   . Hypercholesteremia   . Atrial fibrillation   . DDD (degenerative disc disease), lumbar    History reviewed. No pertinent past surgical history.  History  Substance Use Topics  . Smoking status: Never Smoker   . Smokeless tobacco: Not on file  . Alcohol Use: No    Review of Systems ROS: Statement: All systems negative except as marked or noted in the HPI; Constitutional: Negative for fever and chills. ; ; Eyes: Negative for eye pain, redness and discharge. ; ; ENMT: Negative for ear pain, hoarseness, nasal congestion, sinus pressure and sore throat. ; ; Cardiovascular: Negative for chest pain, palpitations, diaphoresis, dyspnea and peripheral edema. ; ; Respiratory: Negative for cough, wheezing and stridor. ; ; Gastrointestinal: Negative for nausea, vomiting, diarrhea, abdominal pain, blood in stool, hematemesis, jaundice and rectal bleeding. . ; ; Genitourinary: +urinary frequency. Negative for dysuria, flank pain and hematuria. ; ; Musculoskeletal: +LBP. Negative for neck pain. Negative for swelling and trauma.; ; Skin: Negative for pruritus, rash, abrasions, blisters, bruising and skin lesion.; ; Neuro: Negative for headache, lightheadedness and neck stiffness. Negative for weakness, altered  level of consciousness , altered mental status, extremity weakness, paresthesias, involuntary movement, seizure and syncope.     Allergies  Review of patient's allergies indicates no known allergies.  Home Medications   Current Outpatient Rx  Name  Route  Sig  Dispense  Refill  . aspirin EC 81 MG tablet   Oral   Take 81 mg by mouth daily.           Marland Kitchen diltiazem (CARDIZEM) 120 MG tablet   Oral   Take 120 mg by mouth daily.           . Omega-3 Fatty Acids (FISH OIL) 1000 MG CAPS   Oral   Take 1 capsule by mouth daily.            BP 136/86  Pulse 70  Temp(Src) 98.2 F (36.8 C) (Oral)  Resp 20  Ht 5\' 1"  (1.549 m)  Wt 120 lb (54.432 kg)  BMI 22.69 kg/m2  SpO2 100% Physical Exam 1340: Physical examination:  Nursing notes reviewed; Vital signs and O2 SAT reviewed;  Constitutional: Well developed, Well nourished, Well hydrated, In no acute distress; Head:  Normocephalic, atraumatic; Eyes: EOMI, PERRL, No scleral icterus; ENMT: Mouth and pharynx normal, Mucous membranes moist; Neck: Supple, Full range of motion, No lymphadenopathy; Cardiovascular: Regular rate and rhythm, No gallop; Respiratory: Breath sounds clear & equal bilaterally, No rales, rhonchi, wheezes.  Speaking full sentences with ease, Normal respiratory effort/excursion; Chest: Nontender, Movement normal; Abdomen: Soft, Nontender, Nondistended, Normal bowel sounds; Genitourinary: No CVA tenderness; Spine:  No midline CS, TS, LS tenderness. +TTP right lower lumbar paraspinal muscles.;; Extremities:  Pulses normal, No tenderness, No edema, No calf edema or asymmetry.; Neuro: AA&Ox3, Major CN grossly intact.  Speech clear. Climbs on and off chair easily by herself. Gait steady. No gross focal motor or sensory deficits in extremities.; Skin: Color normal, Warm, Dry.   ED Course   Procedures   MDM  MDM Reviewed: previous chart, nursing note and vitals Interpretation: labs   Results for orders placed during the  hospital encounter of 09/22/12  URINALYSIS W MICROSCOPIC + REFLEX CULTURE      Result Value Range   Color, Urine YELLOW  YELLOW   APPearance CLEAR  CLEAR   Specific Gravity, Urine 1.020  1.005 - 1.030   pH 6.5  5.0 - 8.0   Glucose, UA NEGATIVE  NEGATIVE mg/dL   Hgb urine dipstick MODERATE (*) NEGATIVE   Bilirubin Urine NEGATIVE  NEGATIVE   Ketones, ur NEGATIVE  NEGATIVE mg/dL   Protein, ur NEGATIVE  NEGATIVE mg/dL   Urobilinogen, UA 0.2  0.0 - 1.0 mg/dL   Nitrite NEGATIVE  NEGATIVE   Leukocytes, UA NEGATIVE  NEGATIVE   WBC, UA 0-2  <3 WBC/hpf   RBC / HPF 11-20  <3 RBC/hpf   Bacteria, UA FEW (*) RARE   Squamous Epithelial / LPF FEW (*) RARE   Urine-Other FEW YEAST     Ct Abdomen Pelvis Wo Contrast 09/22/2012   *RADIOLOGY REPORT*  Clinical Data: back pain and hematuria  CT ABDOMEN AND PELVIS WITHOUT CONTRAST  Technique:  Multidetector CT imaging of the abdomen and pelvis was performed following the standard protocol without intravenous contrast.  Comparison: None.  Findings: Visualized lung bases are clear.  There are no acute musculoskeletal abnormalities but there is grade 1 anterior listhesis of L4 on L5 which appears to be due to significant degenerative facet disease at this level.  There is also significant degenerative facet disease at L5 S1.  There are numerous low attenuation round and oval liver lesions. The largest shows an average attenuation value of zero, and is seen on the right, measuring approximately 11 cm.  All of these lesions appear consistent with cysts of varying sizes involving both the left and right lobes of the liver.  The spleen is normal. Gallbladder and pancreas are normal.  Adrenal glands are normal. Left kidney is normal.  There is mild dilatation of the renal pelvis and proximal ureter on the right.  There is a 2 mm stone image number 39 in the proximal right ureter.  Just distal to this is another punctate approximately 1 mm stone within the right ureter on image  number 42.  The calcifications seen along the course of the ureter on image number 49, measuring about 3 mm, appears to represent a fleet bolus more distally.  Bladder and reproductive organs are normal.  There is no free fluid.  Bowel appears normal.  Appendix is normal.  IMPRESSION: Mild hydronephrosis due to two tiny stones in the proximal right ureter.  Numerous large hepatic cysts.   Original Report Authenticated By: Esperanza Heir, M.D.   Dg Lumbar Spine Complete 09/22/2012   *RADIOLOGY REPORT*  Clinical Data: Right flank pain, low back pain  LUMBAR SPINE - COMPLETE 4+ VIEW  Comparison: None.  Findings: Five views of the lumbar spine submitted.  No acute fracture.  There is  disc space flattening at L5-S1 level. About 3 mm anterolisthesis L5 on S1 vertebral body.  Significant stool are noted in the right colon and transverse colon.  Gas and stool noted  in the distal transverse and left colon.  IMPRESSION: No acute fracture. There is disc space flattening at L5-S1 level. About 3 mm anterolisthesis L5 on S1 vertebral body.  Significant stool noted in the right colon.  Gas and stool noted in distal colon.   Original Report Authenticated By: Natasha Mead, M.D.    1600:  Pt with 2 small ureteral calculi; will tx symptomatically at this time. No UTI on Udip. Pt wants to go home now. Pain controlled after meds. Tol PO well without N/V while in the ED. Dx and testing d/w pt.  Questions answered.  Verb understanding, agreeable to d/c home with outpt f/u.    Laray Anger, DO 09/24/12 1233

## 2012-09-22 NOTE — ED Notes (Signed)
Right side lower back pain with urinary freq x 1 week. Nad.

## 2013-08-14 ENCOUNTER — Other Ambulatory Visit (HOSPITAL_COMMUNITY): Payer: Self-pay | Admitting: *Deleted

## 2013-08-14 DIAGNOSIS — Z139 Encounter for screening, unspecified: Secondary | ICD-10-CM

## 2013-08-18 ENCOUNTER — Ambulatory Visit (HOSPITAL_COMMUNITY)
Admission: RE | Admit: 2013-08-18 | Discharge: 2013-08-18 | Disposition: A | Discharge status: Home / Self Care | Payer: PRIVATE HEALTH INSURANCE | Source: Ambulatory Visit | Attending: *Deleted | Admitting: *Deleted

## 2013-08-18 DIAGNOSIS — Z139 Encounter for screening, unspecified: Secondary | ICD-10-CM

## 2013-08-18 DIAGNOSIS — Z1231 Encounter for screening mammogram for malignant neoplasm of breast: Secondary | ICD-10-CM | POA: Insufficient documentation

## 2013-08-30 ENCOUNTER — Encounter (HOSPITAL_COMMUNITY): Payer: Self-pay | Admitting: Emergency Medicine

## 2013-08-30 ENCOUNTER — Emergency Department (HOSPITAL_COMMUNITY)
Admission: EM | Admit: 2013-08-30 | Discharge: 2013-08-30 | Disposition: A | Discharge status: Home / Self Care | Payer: Self-pay | Attending: Emergency Medicine | Admitting: Emergency Medicine

## 2013-08-30 DIAGNOSIS — I4891 Unspecified atrial fibrillation: Secondary | ICD-10-CM | POA: Insufficient documentation

## 2013-08-30 DIAGNOSIS — Z79899 Other long term (current) drug therapy: Secondary | ICD-10-CM | POA: Insufficient documentation

## 2013-08-30 DIAGNOSIS — I10 Essential (primary) hypertension: Secondary | ICD-10-CM | POA: Insufficient documentation

## 2013-08-30 DIAGNOSIS — Z7982 Long term (current) use of aspirin: Secondary | ICD-10-CM | POA: Insufficient documentation

## 2013-08-30 DIAGNOSIS — R350 Frequency of micturition: Secondary | ICD-10-CM | POA: Insufficient documentation

## 2013-08-30 DIAGNOSIS — R42 Dizziness and giddiness: Secondary | ICD-10-CM | POA: Insufficient documentation

## 2013-08-30 DIAGNOSIS — Z8639 Personal history of other endocrine, nutritional and metabolic disease: Secondary | ICD-10-CM | POA: Insufficient documentation

## 2013-08-30 DIAGNOSIS — Z8739 Personal history of other diseases of the musculoskeletal system and connective tissue: Secondary | ICD-10-CM | POA: Insufficient documentation

## 2013-08-30 DIAGNOSIS — Z862 Personal history of diseases of the blood and blood-forming organs and certain disorders involving the immune mechanism: Secondary | ICD-10-CM | POA: Insufficient documentation

## 2013-08-30 LAB — I-STAT CHEM 8, ED
BUN: 10 mg/dL (ref 6–23)
Calcium, Ion: 1.16 mmol/L (ref 1.13–1.30)
Chloride: 109 mEq/L (ref 96–112)
Creatinine, Ser: 0.8 mg/dL (ref 0.50–1.10)
Glucose, Bld: 113 mg/dL — ABNORMAL HIGH (ref 70–99)
HCT: 40 % (ref 36.0–46.0)
HEMOGLOBIN: 13.6 g/dL (ref 12.0–15.0)
POTASSIUM: 3.4 meq/L — AB (ref 3.7–5.3)
SODIUM: 142 meq/L (ref 137–147)
TCO2: 25 mmol/L (ref 0–100)

## 2013-08-30 LAB — URINE MICROSCOPIC-ADD ON

## 2013-08-30 LAB — URINALYSIS, ROUTINE W REFLEX MICROSCOPIC
Bilirubin Urine: NEGATIVE
Glucose, UA: NEGATIVE mg/dL
Ketones, ur: NEGATIVE mg/dL
Nitrite: NEGATIVE
Protein, ur: NEGATIVE mg/dL
Specific Gravity, Urine: 1.013 (ref 1.005–1.030)
UROBILINOGEN UA: 0.2 mg/dL (ref 0.0–1.0)
pH: 7 (ref 5.0–8.0)

## 2013-08-30 MED ORDER — DILTIAZEM HCL ER 120 MG PO CP24
120.0000 mg | ORAL_CAPSULE | Freq: Once | ORAL | Status: AC
  Administered 2013-08-30: 120 mg via ORAL
  Filled 2013-08-30: qty 1

## 2013-08-30 MED ORDER — MECLIZINE HCL 25 MG PO TABS
25.0000 mg | ORAL_TABLET | Freq: Once | ORAL | Status: AC
  Administered 2013-08-30: 25 mg via ORAL
  Filled 2013-08-30: qty 1

## 2013-08-30 NOTE — ED Notes (Signed)
Pt reports getting up to check on her grandson and she felt the room spinning. Pt denies any LOC. Pt reports not being able to walk straight, and could not control herself. Pt has hx of HTN, states she is unsure if she took her medication last night. Vitals by EMS 196/108, 18 resp, 84 HR, 97%.

## 2013-08-30 NOTE — Discharge Instructions (Signed)

## 2013-08-30 NOTE — ED Provider Notes (Signed)
CSN: [id number]     Arrival date & time 08/30/13  0508 History   First MD Initiated Contact with Patient 08/30/13 [phone]     Chief Complaint  Patient presents with  . Dizziness      Patient is a 63 y.o. female presenting with dizziness. The history is provided by the patient.  Dizziness Quality:  Room spinning Severity:  Moderate Onset quality:  Sudden Duration: just prior to arrival. Timing:  Constant Progression:  Improving Chronicity:  New Relieved by:  Being still Worsened by:  Movement Associated symptoms: no chest pain, no headaches, no hearing loss, no palpitations, no shortness of breath, no syncope, no tinnitus, no vision changes and no vomiting   Pt reports she missed her BP meds yesterday She woke up in the night to check on grandchild and after standing reports room spinning It made it difficult to ambulate and she fell once but no LOC No head injury No HA reported No focal weakness No visual loss or hearing changes No diplopia She is now feeling improved No h/o CVA She thinks this is due to missing her BP meds  Past Medical History  Diagnosis Date  . Hypertension   . Hypercholesteremia   . Atrial fibrillation   . DDD (degenerative disc disease), lumbar    History reviewed. No pertinent past surgical history. No family history on file. History  Substance Use Topics  . Smoking status: Never Smoker   . Smokeless tobacco: Not on file  . Alcohol Use: No   OB History   Grav Para Term Preterm Abortions TAB SAB Ect Mult Living                 Review of Systems  Constitutional: Negative for fever.  HENT: Negative for hearing loss and tinnitus.   Eyes: Negative for visual disturbance.  Respiratory: Negative for shortness of breath.   Cardiovascular: Negative for chest pain, palpitations and syncope.  Gastrointestinal: Negative for vomiting and abdominal pain.  Genitourinary: Positive for frequency. Negative for dysuria.  Neurological: Positive for  dizziness. Negative for syncope, speech difficulty and headaches.  All other systems reviewed and are negative.     Allergies  Review of patient's allergies indicates no known allergies.  Home Medications   Prior to Admission medications   Medication Sig Start Date End Date Taking? Authorizing Provider  aspirin EC 81 MG tablet Take 81 mg by mouth daily.     Yes Historical Provider, MD  diltiazem (CARDIZEM) 120 MG tablet Take 120 mg by mouth daily.     Yes Historical Provider, MD  Omega-3 Fatty Acids (FISH OIL) 1000 MG CAPS Take 1 capsule by mouth daily.     Yes Historical Provider, MD   BP 165/93  Pulse 84  Temp(Src) 98.4 F (36.9 C) (Oral)  Resp 25  SpO2 100% Physical Exam CONSTITUTIONAL: Well developed/well nourished HEAD: Normocephalic/atraumatic EYES: EOMI/PERRL, no nystagmus, no ptosis,  ENMT: Mucous membranes moist. Bilateral TM clear/intact NECK: supple no meningeal signs, no bruits SPINE:entire spine nontender CV: S1/S2 noted, no murmurs/rubs/gallops noted LUNGS: Lungs are clear to auscultation bilaterally, no apparent distress ABDOMEN: soft, nontender, no rebound or guarding GU:no cva tenderness NEURO:Awake/alert, facies symmetric, no arm or leg drift is noted Equal 5/5 strength with shoulder abduction, elbow flex/extension, wrist flex/extension in upper extremities and equal hand grips bilaterally Equal 5/5 strength with hip flexion,knee flex/extension, foot dorsi/plantar flexion Cranial nerves 3/4/5/6/08/14/08/11/12 tested and intact Gait normal without ataxia No past pointing Sensation to light touch intact  in all extremities EXTREMITIES: pulses normal, full ROM SKIN: warm, color normal PSYCH: no abnormalities of mood noted   ED Course  Procedures   8:07 AM No signs of acute CVA Pt ambulatory She requests her home BP meds  Pt feels improved and requests d/c home She had no neuro deficits and was ambulatory Suspect peripheral vertigo Labs Review Labs  Reviewed  I-STAT CHEM 8, ED - Abnormal; Notable for the following:    Potassium 3.4 (*)    Glucose, Bld 113 (*)    All other components within normal limits  URINALYSIS, ROUTINE W REFLEX MICROSCOPIC     EKG Interpretation   Date/Time:  Saturday August 30 2013 08:19:46 EDT Ventricular Rate:  79 PR Interval:  171 QRS Duration: 89 QT Interval:  359 QTC Calculation: 411 R Axis:   41 Text Interpretation:  Sinus rhythm Abnormal R-wave progression, early  transition artifact noted Confirmed by Christy Gentles  MD, Elenore Rota ([id number]) on  08/30/2013 8:28:39 AM      MDM   Final diagnoses:  Vertigo    Nursing notes including past medical history and social history reviewed and considered in documentation Labs/vital reviewed and considered     Sharyon Cable, MD 08/30/13 1007

## 2013-08-31 ENCOUNTER — Emergency Department (HOSPITAL_COMMUNITY)
Admission: EM | Admit: 2013-08-31 | Discharge: 2013-08-31 | Disposition: A | Discharge status: Home / Self Care | Payer: Self-pay | Attending: Emergency Medicine | Admitting: Emergency Medicine

## 2013-08-31 ENCOUNTER — Encounter (HOSPITAL_COMMUNITY): Payer: Self-pay | Admitting: Emergency Medicine

## 2013-08-31 DIAGNOSIS — Z79899 Other long term (current) drug therapy: Secondary | ICD-10-CM | POA: Insufficient documentation

## 2013-08-31 DIAGNOSIS — Z862 Personal history of diseases of the blood and blood-forming organs and certain disorders involving the immune mechanism: Secondary | ICD-10-CM | POA: Insufficient documentation

## 2013-08-31 DIAGNOSIS — Z8639 Personal history of other endocrine, nutritional and metabolic disease: Secondary | ICD-10-CM | POA: Insufficient documentation

## 2013-08-31 DIAGNOSIS — I4891 Unspecified atrial fibrillation: Secondary | ICD-10-CM | POA: Insufficient documentation

## 2013-08-31 DIAGNOSIS — I10 Essential (primary) hypertension: Secondary | ICD-10-CM | POA: Insufficient documentation

## 2013-08-31 DIAGNOSIS — M51379 Other intervertebral disc degeneration, lumbosacral region without mention of lumbar back pain or lower extremity pain: Secondary | ICD-10-CM | POA: Insufficient documentation

## 2013-08-31 DIAGNOSIS — M5137 Other intervertebral disc degeneration, lumbosacral region: Secondary | ICD-10-CM | POA: Insufficient documentation

## 2013-08-31 DIAGNOSIS — Z7982 Long term (current) use of aspirin: Secondary | ICD-10-CM | POA: Insufficient documentation

## 2013-08-31 NOTE — ED Notes (Signed)
Patient seen yesterday at [facility] after she started becoming dizzy and had near syncopal episode. Per patient blood pressure was 197/108. Patient states she was given meclizine for the dizziness and diltiazem XR 120mg . Patient reports blood pressure still elevated even with medication and concerned she was going to "have another episode."

## 2013-08-31 NOTE — ED Provider Notes (Signed)
CSN: [id number]     Arrival date & time 08/31/13  7062 History   First MD Initiated Contact with Patient 08/31/13 [phone]     Chief Complaint  Patient presents with  . Hypertension     (Consider location/radiation/quality/duration/timing/severity/associated sxs/prior Treatment) The history is provided by the patient.   Crystal Cooke is a 63 y.o. female presenting for a recheck since she was seen yesterday at [facility] ed for dizziness felt to be due to vertigo. She has a history of htn and this was also elevated yesterday although had missed her morning dose of diltiazem prior to arriving at [facility] ED early yesterday morning.  Her blood pressure was treated she was advised to followup with her primary care Dr. this week.  She continued to check her blood pressure yesterday evening with each blood pressure being increasingly elevated, stating her last check before she went to bed was approximately 155/99.  She took an additional dose of her diltiazem, but then worried that her blood pressure will get too low and was afraid to take another dose this morning before further evaluation here.  She's had no further dizzy episodes.  She does describe slight vision blurriness when she first woke without current decreased acuity.  She denies headache,  nausea or vomiting.  She had a brief episode of right sided chest tightness this morning which has resolved.    Past Medical History  Diagnosis Date  . Hypertension   . Hypercholesteremia   . Atrial fibrillation   . DDD (degenerative disc disease), lumbar    History reviewed. No pertinent past surgical history. Family History  Problem Relation Age of Onset  . Cancer Mother   . Hypertension Mother   . Diabetes Father   . Hypertension Father    History  Substance Use Topics  . Smoking status: Never Smoker   . Smokeless tobacco: Never Used  . Alcohol Use: No   OB History   Grav Para Term Preterm Abortions TAB SAB Ect Mult Living   5 5 5       5       Review of Systems  Constitutional: Negative for fever.  HENT: Negative for congestion and sore throat.   Eyes: Positive for visual disturbance.  Respiratory: Negative for chest tightness and shortness of breath.   Cardiovascular: Negative for chest pain.  Gastrointestinal: Negative for nausea and abdominal pain.  Genitourinary: Negative.   Musculoskeletal: Negative for arthralgias, joint swelling and neck pain.  Skin: Negative.  Negative for rash and wound.  Neurological: Positive for dizziness. Negative for weakness, light-headedness, numbness and headaches.  Psychiatric/Behavioral: Negative.       Allergies  Statins  Home Medications   Prior to Admission medications   Medication Sig Start Date End Date Taking? Authorizing Provider  acetaminophen (TYLENOL) 500 MG tablet Take 1,000 mg by mouth every 6 (six) hours as needed for moderate pain or headache.   Yes Historical Provider, MD  aspirin EC 81 MG tablet Take 81 mg by mouth daily.     Yes Historical Provider, MD  diltiazem (CARDIZEM) 120 MG tablet Take 120 mg by mouth daily.     Yes Historical Provider, MD  Multiple Vitamin (MULTIVITAMIN WITH MINERALS) TABS tablet Take 1 tablet by mouth daily.   Yes Historical Provider, MD  Omega-3 Fatty Acids (FISH OIL) 1000 MG CAPS Take 1 capsule by mouth daily.     Yes Historical Provider, MD   BP 126/83  Pulse 69  Temp(Src) 98.5 F (36.9  C) (Oral)  Resp 15  Ht 5\' 1"  (1.549 m)  Wt 130 lb (58.968 kg)  BMI 24.58 kg/m2  SpO2 96% Physical Exam  Nursing note and vitals reviewed. Constitutional: She appears well-developed and well-nourished.  HENT:  Head: Normocephalic and atraumatic.  Eyes: Conjunctivae are normal.  Neck: Normal range of motion.  Cardiovascular: Normal rate, regular rhythm, normal heart sounds and intact distal pulses.   Pulmonary/Chest: Effort normal and breath sounds normal. She has no wheezes. She has no rales. She exhibits no tenderness.  Abdominal: Soft. Bowel  sounds are normal. There is no tenderness.  Musculoskeletal: Normal range of motion.  Neurological: She is alert.  Skin: Skin is warm and dry.  Psychiatric: Her mood appears anxious.    ED Course  Procedures (including critical care time) Labs Review Labs Reviewed - No data to display  Imaging Review No results found.   EKG Interpretation   Date/Time:  Sunday August 31 2013 10:11:33 EDT Ventricular Rate:  73 PR Interval:  166 QRS Duration: 88 QT Interval:  378 QTC Calculation: 416 R Axis:   27 Text Interpretation:  Normal sinus rhythm Normal ECG No significant change  since last tracing Confirmed by Christy Gentles  MD, DONALD ([id number]) on 08/31/2013  10:39:35 AM      MDM   Final diagnoses:  Essential hypertension    Pt was also seen by Dr. Christy Gentles during this visit.  She was asked to not check her bp the remainder of today, explained this can lead to anxiety and increase bp, pt understands.  She took her am dose of her diltiazem while here.  Advised f/u with her pcp tomorrow who can assess/determine if she would like pt to be placed on higher dosing vs adding new bp med.  Pt understands and agrees with plan.    Evalee Jefferson, PA-C 09/01/13 [phone]

## 2013-08-31 NOTE — ED Notes (Signed)
Pt reports hypertension, headache and blurred vision this morning. Pt was recently tx for HTN and given BP medication. Pt states symptoms had not improved so she came back to ED for recheck.

## 2013-08-31 NOTE — Discharge Instructions (Signed)
Hypertension °Hypertension, commonly called high blood pressure, is when the force of blood pumping through your arteries is too strong. Your arteries are the blood vessels that carry blood from your heart throughout your body. A blood pressure reading consists of a higher number over a lower number, such as 110/72. The higher number (systolic) is the pressure inside your arteries when your heart pumps. The lower number (diastolic) is the pressure inside your arteries when your heart relaxes. Ideally you want your blood pressure below 120/80. °Hypertension forces your heart to work harder to pump blood. Your arteries may become narrow or stiff. Having hypertension puts you at risk for heart disease, stroke, and other problems.  °RISK FACTORS °Some risk factors for high blood pressure are controllable. Others are not.  °Risk factors you cannot control include:  °· Race. You may be at higher risk if you are African American. °· Age. Risk increases with age. °· Gender. Men are at higher risk than women before age 45 years. After age 65, women are at higher risk than men. °Risk factors you can control include: °· Not getting enough exercise or physical activity. °· Being overweight. °· Getting too much fat, sugar, calories, or salt in your diet. °· Drinking too much alcohol. °SIGNS AND SYMPTOMS °Hypertension does not usually cause signs or symptoms. Extremely high blood pressure (hypertensive crisis) may cause headache, anxiety, shortness of breath, and nosebleed. °DIAGNOSIS  °To check if you have hypertension, your health care provider will measure your blood pressure while you are seated, with your arm held at the level of your heart. It should be measured at least twice using the same arm. Certain conditions can cause a difference in blood pressure between your right and left arms. A blood pressure reading that is higher than normal on one occasion does not mean that you need treatment. If one blood pressure reading  is high, ask your health care provider about having it checked again. °TREATMENT  °Treating high blood pressure includes making lifestyle changes and possibly taking medicine. Living a healthy lifestyle can help lower high blood pressure. You may need to change some of your habits. °Lifestyle changes may include: °· Following the DASH diet. This diet is high in fruits, vegetables, and whole grains. It is low in salt, red meat, and added sugars. °· Getting at least 2½ hours of brisk physical activity every week. °· Losing weight if necessary. °· Not smoking. °· Limiting alcoholic beverages. °· Learning ways to reduce stress. ° If lifestyle changes are not enough to get your blood pressure under control, your health care provider may prescribe medicine. You may need to take more than one. Work closely with your health care provider to understand the risks and benefits. °HOME CARE INSTRUCTIONS °· Have your blood pressure rechecked as directed by your health care provider.   °· Take medicines only as directed by your health care provider. Follow the directions carefully. Blood pressure medicines must be taken as prescribed. The medicine does not work as well when you skip doses. Skipping doses also puts you at risk for problems.   °· Do not smoke.   °· Monitor your blood pressure at home as directed by your health care provider.  °SEEK MEDICAL CARE IF:  °· You think you are having a reaction to medicines taken. °· You have recurrent headaches or feel dizzy. °· You have swelling in your ankles. °· You have trouble with your vision. °SEEK IMMEDIATE MEDICAL CARE IF: °· You develop a severe headache or confusion. °·   You have unusual weakness, numbness, or feel faint.  You have severe chest or abdominal pain.  You vomit repeatedly.  You have trouble breathing. MAKE SURE YOU:   Understand these instructions.  Will watch your condition.  Will get help right away if you are not doing well or get worse. Document  Released: 01/23/2005 Document Revised: 06/09/2013 Document Reviewed: 11/15/2012 [facility] Patient Information 2015 [place], [place]. This information is not intended to replace advice given to you by your health care provider. Make sure you discuss any questions you have with your health care provider.   Avoid taking your blood pressure at home today as advised by Dr. Christy Gentles.  This can increase anxiety and cause elevation in blood pressure.  Return here for any development of symptoms as discussed.  Plan to see your doctor tomorrow for recheck of your blood pressure and consideration of adding additional blood pressure treatment.

## 2013-08-31 NOTE — ED Provider Notes (Signed)
Patient seen/examined in the Emergency Department in conjunction with Midlevel Provider [facility] Patient reports elevated blood pressure.  No dizziness or focal weakness reported Exam : awake/alert, no distress, conversant, well appearing Plan: discussed need to f/u with PCP tomorrow for repeat evaluation and may need new BP meds Seen yesterday by myself at [facility] for vertigo, she reports vertigo resolved.  I doubt acute CVA at this time   EKG Interpretation  Date/Time:  Sunday August 31 2013 10:11:33 EDT Ventricular Rate:  73 PR Interval:  166 QRS Duration: 88 QT Interval:  378 QTC Calculation: 416 R Axis:   27 Text Interpretation:  Normal sinus rhythm Normal ECG No significant change since last tracing Confirmed by Christy Gentles  MD, Oniyah Rohe ([id number]) on 08/31/2013 10:39:35 AM         Sharyon Cable, MD 08/31/13 1052

## 2013-09-02 NOTE — ED Provider Notes (Signed)
Medical screening examination/treatment/procedure(s) were conducted as a shared visit with non-physician practitioner(s) and myself.  I personally evaluated the patient during the encounter.   EKG Interpretation   Date/Time:  Sunday August 31 2013 10:11:33 EDT Ventricular Rate:  73 PR Interval:  166 QRS Duration: 88 QT Interval:  378 QTC Calculation: 416 R Axis:   27 Text Interpretation:  Normal sinus rhythm Normal ECG No significant change  since last tracing Confirmed by Christy Gentles  MD, Corrin Sieling ([id number]) on 08/31/2013  10:39:35 AM        Sharyon Cable, MD 09/02/13 [phone]

## 2013-09-14 ENCOUNTER — Emergency Department (HOSPITAL_COMMUNITY)
Admission: EM | Admit: 2013-09-14 | Discharge: 2013-09-14 | Disposition: A | Discharge status: Home / Self Care | Payer: Self-pay | Attending: Emergency Medicine | Admitting: Emergency Medicine

## 2013-09-14 ENCOUNTER — Encounter (HOSPITAL_COMMUNITY): Payer: Self-pay | Admitting: Emergency Medicine

## 2013-09-14 DIAGNOSIS — R002 Palpitations: Secondary | ICD-10-CM | POA: Insufficient documentation

## 2013-09-14 DIAGNOSIS — Z8739 Personal history of other diseases of the musculoskeletal system and connective tissue: Secondary | ICD-10-CM | POA: Insufficient documentation

## 2013-09-14 DIAGNOSIS — R0602 Shortness of breath: Secondary | ICD-10-CM | POA: Insufficient documentation

## 2013-09-14 DIAGNOSIS — Z7982 Long term (current) use of aspirin: Secondary | ICD-10-CM | POA: Insufficient documentation

## 2013-09-14 DIAGNOSIS — Z8639 Personal history of other endocrine, nutritional and metabolic disease: Secondary | ICD-10-CM | POA: Insufficient documentation

## 2013-09-14 DIAGNOSIS — Z79899 Other long term (current) drug therapy: Secondary | ICD-10-CM | POA: Insufficient documentation

## 2013-09-14 DIAGNOSIS — Z862 Personal history of diseases of the blood and blood-forming organs and certain disorders involving the immune mechanism: Secondary | ICD-10-CM | POA: Insufficient documentation

## 2013-09-14 DIAGNOSIS — I10 Essential (primary) hypertension: Secondary | ICD-10-CM | POA: Insufficient documentation

## 2013-09-14 LAB — BASIC METABOLIC PANEL
Anion gap: 13 (ref 5–15)
BUN: 12 mg/dL (ref 6–23)
CALCIUM: 9.8 mg/dL (ref 8.4–10.5)
CO2: 27 meq/L (ref 19–32)
Chloride: 103 mEq/L (ref 96–112)
Creatinine, Ser: 0.85 mg/dL (ref 0.50–1.10)
GFR calc Af Amer: 82 mL/min — ABNORMAL LOW (ref >90)
GFR calc non Af Amer: 71 mL/min — ABNORMAL LOW (ref >90)
GLUCOSE: 110 mg/dL — AB (ref 70–99)
Potassium: 3.5 mEq/L — ABNORMAL LOW (ref 3.7–5.3)
Sodium: 143 mEq/L (ref 137–147)

## 2013-09-14 LAB — CBC WITH DIFFERENTIAL/PLATELET
Basophils Absolute: 0 10*3/uL (ref 0.0–0.1)
Basophils Relative: 0 % (ref 0–1)
EOS ABS: 0 10*3/uL (ref 0.0–0.7)
EOS PCT: 0 % (ref 0–5)
HEMATOCRIT: 40.7 % (ref 36.0–46.0)
Hemoglobin: 14 g/dL (ref 12.0–15.0)
LYMPHS ABS: 1.3 10*3/uL (ref 0.7–4.0)
Lymphocytes Relative: 18 % (ref 12–46)
MCH: 32.2 pg (ref 26.0–34.0)
MCHC: 34.4 g/dL (ref 30.0–36.0)
MCV: 93.6 fL (ref 78.0–100.0)
MONO ABS: 0.4 10*3/uL (ref 0.1–1.0)
Monocytes Relative: 5 % (ref 3–12)
Neutro Abs: 5.5 10*3/uL (ref 1.7–7.7)
Neutrophils Relative %: 77 % (ref 43–77)
PLATELETS: 202 10*3/uL (ref 150–400)
RBC: 4.35 MIL/uL (ref 3.87–5.11)
RDW: 12.2 % (ref 11.5–15.5)
WBC: 7.3 10*3/uL (ref 4.0–10.5)

## 2013-09-14 LAB — TROPONIN I

## 2013-09-14 MED ORDER — VERAPAMIL HCL 80 MG PO TABS
80.0000 mg | ORAL_TABLET | Freq: Once | ORAL | Status: AC
  Administered 2013-09-14: 80 mg via ORAL
  Filled 2013-09-14: qty 1

## 2013-09-14 MED ORDER — VERAPAMIL HCL 80 MG PO TABS: 80.0000 mg | ORAL_TABLET | Freq: Three times a day (TID) | ORAL | Status: DC

## 2013-09-14 NOTE — ED Notes (Signed)
PT c/o high blood pressure for a few days. PT stated this morning she had an episode of her heart racing with dizziness and she was breathing fast. PT stated she took her bp medication at 0600 this morning.

## 2013-09-14 NOTE — Discharge Instructions (Signed)
Hypertension °Hypertension, commonly called high blood pressure, is when the force of blood pumping through your arteries is too strong. Your arteries are the blood vessels that carry blood from your heart throughout your body. A blood pressure reading consists of a higher number over a lower number, such as 110/72. The higher number (systolic) is the pressure inside your arteries when your heart pumps. The lower number (diastolic) is the pressure inside your arteries when your heart relaxes. Ideally you want your blood pressure below 120/80. °Hypertension forces your heart to work harder to pump blood. Your arteries may become narrow or stiff. Having hypertension puts you at risk for heart disease, stroke, and other problems.  °RISK FACTORS °Some risk factors for high blood pressure are controllable. Others are not.  °Risk factors you cannot control include:  °· Race. You may be at higher risk if you are African American. °· Age. Risk increases with age. °· Gender. Men are at higher risk than women before age 45 years. After age 65, women are at higher risk than men. °Risk factors you can control include: °· Not getting enough exercise or physical activity. °· Being overweight. °· Getting too much fat, sugar, calories, or salt in your diet. °· Drinking too much alcohol. °SIGNS AND SYMPTOMS °Hypertension does not usually cause signs or symptoms. Extremely high blood pressure (hypertensive crisis) may cause headache, anxiety, shortness of breath, and nosebleed. °DIAGNOSIS  °To check if you have hypertension, your health care provider will measure your blood pressure while you are seated, with your arm held at the level of your heart. It should be measured at least twice using the same arm. Certain conditions can cause a difference in blood pressure between your right and left arms. A blood pressure reading that is higher than normal on one occasion does not mean that you need treatment. If one blood pressure reading  is high, ask your health care provider about having it checked again. °TREATMENT  °Treating high blood pressure includes making lifestyle changes and possibly taking medicine. Living a healthy lifestyle can help lower high blood pressure. You may need to change some of your habits. °Lifestyle changes may include: °· Following the DASH diet. This diet is high in fruits, vegetables, and whole grains. It is low in salt, red meat, and added sugars. °· Getting at least 2½ hours of brisk physical activity every week. °· Losing weight if necessary. °· Not smoking. °· Limiting alcoholic beverages. °· Learning ways to reduce stress. ° If lifestyle changes are not enough to get your blood pressure under control, your health care provider may prescribe medicine. You may need to take more than one. Work closely with your health care provider to understand the risks and benefits. °HOME CARE INSTRUCTIONS °· Have your blood pressure rechecked as directed by your health care provider.   °· Take medicines only as directed by your health care provider. Follow the directions carefully. Blood pressure medicines must be taken as prescribed. The medicine does not work as well when you skip doses. Skipping doses also puts you at risk for problems.   °· Do not smoke.   °· Monitor your blood pressure at home as directed by your health care provider.  °SEEK MEDICAL CARE IF:  °· You think you are having a reaction to medicines taken. °· You have recurrent headaches or feel dizzy. °· You have swelling in your ankles. °· You have trouble with your vision. °SEEK IMMEDIATE MEDICAL CARE IF: °· You develop a severe headache or confusion. °·   You have unusual weakness, numbness, or feel faint.  You have severe chest or abdominal pain.  You vomit repeatedly.  You have trouble breathing. MAKE SURE YOU:   Understand these instructions.  Will watch your condition.  Will get help right away if you are not doing well or get worse. Document  Released: 01/23/2005 Document Revised: 06/09/2013 Document Reviewed: 11/15/2012 [facility] Patient Information 2015 [place], [place]. This information is not intended to replace advice given to you by your health care provider. Make sure you discuss any questions you have with your health care provider.   Start taking the new blood pressure medication in place of your diltiazem.  Follow up with your doctor as planned.

## 2013-09-14 NOTE — ED Provider Notes (Signed)
CSN: [id number]     Arrival date & time 09/14/13  7026 History   First MD Initiated Contact with Patient 09/14/13 1000     Chief Complaint  Patient presents with  . Hypertension     (Consider location/radiation/quality/duration/timing/severity/associated sxs/prior Treatment) HPI  Crystal Cooke is a 63 y.o. female presenting with complaints of high blood pressure.  She was seen here approximately 2 weeks ago with similar complaints.  She is currently taking diltiazem 120 mg immediate release tablets once daily and she has complaint of persistent elevation in her pressure.  She woke this morning with a short lived episode of heart palpitations, she felt lightheaded after having shortness of breath, both symptoms have resolved.  She took her bp and it was 175/99 at which time she started feeling sob and lightheaded,  Prompting her to think this was anxiety over her blood pressure causing her symptoms.  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  She often will take more than one of her ditiazem tablets throughout the day although it is prescribed once daily.  She denies chest pain, headache, peripheral edema.      Past Medical History  Diagnosis Date  . Hypertension   . Hypercholesteremia   . Atrial fibrillation   . DDD (degenerative disc disease), lumbar    History reviewed. No pertinent past surgical history. Family History  Problem Relation Age of Onset  . Cancer Mother   . Hypertension Mother   . Diabetes Father   . Hypertension Father    History  Substance Use Topics  . Smoking status: Never Smoker   . Smokeless tobacco: Never Used  . Alcohol Use: No   OB History   Grav Para Term Preterm Abortions TAB SAB Ect Mult Living   5 5 5       5      Review of Systems  Constitutional: Negative for fever and chills.  HENT: Negative for congestion and sore throat.   Eyes: Negative.   Respiratory: Positive for  shortness of breath. Negative for chest tightness.   Cardiovascular: Positive for palpitations. Negative for chest pain and leg swelling.  Gastrointestinal: Negative for nausea and abdominal pain.  Genitourinary: Negative.   Musculoskeletal: Negative for arthralgias, joint swelling and neck pain.  Skin: Negative.  Negative for rash and wound.  Neurological: Negative for dizziness, weakness, light-headedness, numbness and headaches.  Psychiatric/Behavioral: Negative.       Allergies  Statins  Home Medications   Prior to Admission medications   Medication Sig Start Date End Date Taking? Authorizing Provider  acetaminophen (TYLENOL) 500 MG tablet Take 1,000 mg by mouth every 6 (six) hours as needed for moderate pain or headache.   Yes Historical Provider, MD  aspirin EC 81 MG tablet Take 81 mg by mouth daily.     Yes Historical Provider, MD  Multiple Vitamin (MULTIVITAMIN WITH MINERALS) TABS tablet Take 1 tablet by mouth daily.   Yes Historical Provider, MD  Omega-3 Fatty Acids (FISH OIL) 1000 MG CAPS Take 1 capsule by mouth daily.     Yes Historical Provider, MD  Tetrahydrozoline HCl (VISINE EXTRA OP) Apply 2 drops to eye daily.   Yes Historical Provider, MD  verapamil (CALAN) 80 MG tablet Take 1 tablet (80 mg total) by mouth 3 (three) times daily. 09/14/13   Evalee Jefferson, PA-C   BP 129/82  Pulse 62  Temp(Src) 98.1 F (36.7 C) (Oral)  Resp 18  Ht 5\' 1"  (1.549 m)  Wt 130 lb (58.968 kg)  BMI 24.58 kg/m2  SpO2 100% Physical Exam  Nursing note and vitals reviewed. Constitutional: She appears well-developed and well-nourished.  HENT:  Head: Normocephalic and atraumatic.  Eyes: Conjunctivae are normal.  Neck: Normal range of motion.  Cardiovascular: Normal rate, regular rhythm, normal heart sounds and intact distal pulses.   Pulmonary/Chest: Effort normal and breath sounds normal. She has no wheezes. She exhibits no tenderness.  Abdominal: Soft. Bowel sounds are normal. There is no  tenderness.  Musculoskeletal: Normal range of motion.  Neurological: She is alert.  Skin: Skin is warm and dry.  Psychiatric: She has a normal mood and affect.    ED Course  Procedures (including critical care time) Labs Review Labs Reviewed  BASIC METABOLIC PANEL - Abnormal; Notable for the following:    Potassium 3.5 (*)    Glucose, Bld 110 (*)    GFR calc non Af Amer 71 (*)    GFR calc Af Amer 82 (*)    All other components within normal limits  CBC WITH DIFFERENTIAL  TROPONIN I    Imaging Review No results found.   EKG Interpretation None      MDM   Final diagnoses:  Essential hypertension    Patients labs and/or radiological studies were viewed and considered during the medical decision making and disposition process.  Labs stable,  Sx or lab findings suggesting end organ damage.  She was switched from diltiazem to verapamil 80 mg tid - this medicine is affordable for her, other options within the Ca channel blocker class, esp ER versions not on the [employer] $4 list.  This was discussed with her and she agrees with this plan.  She has a f/u with her pcp in 2 weeks and will f/u as scheduled.    Evalee Jefferson, PA-C 09/15/13 [phone]

## 2013-09-16 NOTE — ED Provider Notes (Signed)
Medical screening examination/treatment/procedure(s) were conducted as a shared visit with non-physician practitioner(s) and myself.  I personally evaluated the patient during the encounter.   EKG Interpretation None     Was switched to diltiazem 80 mg 3 times a day secondary to cost. Patient is hemodynamically stable.  Nat Christen, MD 09/16/13 1109

## 2013-09-17 ENCOUNTER — Encounter (HOSPITAL_COMMUNITY): Payer: Self-pay | Admitting: Emergency Medicine

## 2013-09-17 ENCOUNTER — Emergency Department (HOSPITAL_COMMUNITY)
Admission: EM | Admit: 2013-09-17 | Discharge: 2013-09-17 | Disposition: A | Discharge status: Home / Self Care | Payer: Self-pay | Attending: Emergency Medicine | Admitting: Emergency Medicine

## 2013-09-17 DIAGNOSIS — R42 Dizziness and giddiness: Secondary | ICD-10-CM | POA: Insufficient documentation

## 2013-09-17 DIAGNOSIS — Z79899 Other long term (current) drug therapy: Secondary | ICD-10-CM | POA: Insufficient documentation

## 2013-09-17 DIAGNOSIS — Z7982 Long term (current) use of aspirin: Secondary | ICD-10-CM | POA: Insufficient documentation

## 2013-09-17 DIAGNOSIS — M51379 Other intervertebral disc degeneration, lumbosacral region without mention of lumbar back pain or lower extremity pain: Secondary | ICD-10-CM | POA: Insufficient documentation

## 2013-09-17 DIAGNOSIS — M5137 Other intervertebral disc degeneration, lumbosacral region: Secondary | ICD-10-CM | POA: Insufficient documentation

## 2013-09-17 DIAGNOSIS — F411 Generalized anxiety disorder: Secondary | ICD-10-CM | POA: Insufficient documentation

## 2013-09-17 DIAGNOSIS — E7800 Pure hypercholesterolemia, unspecified: Secondary | ICD-10-CM | POA: Insufficient documentation

## 2013-09-17 DIAGNOSIS — I10 Essential (primary) hypertension: Secondary | ICD-10-CM | POA: Insufficient documentation

## 2013-09-17 LAB — CBC WITH DIFFERENTIAL/PLATELET
BASOS ABS: 0 10*3/uL (ref 0.0–0.1)
BASOS PCT: 0 % (ref 0–1)
EOS ABS: 0 10*3/uL (ref 0.0–0.7)
EOS PCT: 1 % (ref 0–5)
HCT: 38.2 % (ref 36.0–46.0)
Hemoglobin: 13 g/dL (ref 12.0–15.0)
Lymphocytes Relative: 23 % (ref 12–46)
Lymphs Abs: 1.5 10*3/uL (ref 0.7–4.0)
MCH: 32.1 pg (ref 26.0–34.0)
MCHC: 34 g/dL (ref 30.0–36.0)
MCV: 94.3 fL (ref 78.0–100.0)
MONOS PCT: 4 % (ref 3–12)
Monocytes Absolute: 0.3 10*3/uL (ref 0.1–1.0)
Neutro Abs: 4.7 10*3/uL (ref 1.7–7.7)
Neutrophils Relative %: 72 % (ref 43–77)
Platelets: 195 10*3/uL (ref 150–400)
RBC: 4.05 MIL/uL (ref 3.87–5.11)
RDW: 12.4 % (ref 11.5–15.5)
WBC: 6.5 10*3/uL (ref 4.0–10.5)

## 2013-09-17 LAB — BASIC METABOLIC PANEL
ANION GAP: 14 (ref 5–15)
BUN: 13 mg/dL (ref 6–23)
CHLORIDE: 102 meq/L (ref 96–112)
CO2: 26 mEq/L (ref 19–32)
CREATININE: 0.85 mg/dL (ref 0.50–1.10)
Calcium: 9.7 mg/dL (ref 8.4–10.5)
GFR calc Af Amer: 82 mL/min — ABNORMAL LOW (ref >90)
GFR, EST NON AFRICAN AMERICAN: 71 mL/min — AB (ref >90)
Glucose, Bld: 121 mg/dL — ABNORMAL HIGH (ref 70–99)
Potassium: 3.4 mEq/L — ABNORMAL LOW (ref 3.7–5.3)
Sodium: 142 mEq/L (ref 137–147)

## 2013-09-17 LAB — TROPONIN I: Troponin I: <0.3 ng/mL (ref <0.30)

## 2013-09-17 MED ORDER — SODIUM CHLORIDE 0.9 % IV BOLUS (SEPSIS)
500.0000 mL | Freq: Once | INTRAVENOUS | Status: AC
  Administered 2013-09-17: 500 mL via INTRAVENOUS

## 2013-09-17 NOTE — ED Notes (Signed)
In to discharge pt. Pt request her blood pressure be checked again. Pt states she wants to speak with the doctor again, she cannot go home with her blood pressure elevated. Nurse attempted to teach pt about blood pressure. Pt not cooperative

## 2013-09-17 NOTE — ED Notes (Signed)
Pt states started new bp med Sunday, verapamil. Started feeling dizzy a few minutes after she took the verapamil. States feels like she has cold chills but doesn't. "little" dizziness now. Denies weakness. Pt offered w/c to room but did not want one. Was stable with ambulation. Nad. Alert/oreinetd. No weakness noted. C/o lower back pain that is worse with deep breathing. States bp at home pta [phone]

## 2013-09-17 NOTE — ED Provider Notes (Signed)
CSN: [id number]     Arrival date & time 09/17/13  0750 History  This chart was scribed for Maudry Diego, MD by Starleen Arms, ED Scribe. This patient was seen in room APA05/APA05 and the patient's care was started at 7:57 AM.   Chief Complaint  Patient presents with  . Dizziness    Patient is a 64 y.o. female presenting with dizziness. The history is provided by the patient. No language interpreter was used.  Dizziness Severity:  Moderate Duration:  2 hours Timing:  Constant Progression:  Unchanged Chronicity:  New Relieved by:  Nothing Worsened by:  Nothing tried Ineffective treatments:  None tried Associated symptoms: chest pain   Associated symptoms: no diarrhea and no headaches     HPI Comments: Crystal Cooke is a 63 y.o. female with a history of HTN, HLD, atrial fibrillation who presents to the Emergency Department complaining of dizziness onset this morning.  Patient was prescribed Calan 3 days ago for HTN and has been compliant. She states she checks her blood pressure morning and night and reports the systolic pressure has been in the 70s since beginning the Calan but cannot remember specific numbers.  She states her BP this morning was 94/61.  She states she currently feels anxious and reports a small amount of chest pain.     Past Medical History  Diagnosis Date  . Hypertension   . Hypercholesteremia   . Atrial fibrillation   . DDD (degenerative disc disease), lumbar    History reviewed. No pertinent past surgical history. Family History  Problem Relation Age of Onset  . Cancer Mother   . Hypertension Mother   . Diabetes Father   . Hypertension Father    History  Substance Use Topics  . Smoking status: Never Smoker   . Smokeless tobacco: Never Used  . Alcohol Use: No   OB History   Grav Para Term Preterm Abortions TAB SAB Ect Mult Living   5 5 5       5      Review of Systems  Constitutional: Negative for appetite change and fatigue.  HENT: Negative  for congestion, ear discharge and sinus pressure.   Eyes: Negative for discharge.  Respiratory: Negative for cough.   Cardiovascular: Positive for chest pain.  Gastrointestinal: Negative for abdominal pain and diarrhea.  Genitourinary: Negative for frequency and hematuria.  Musculoskeletal: Negative for back pain.  Skin: Negative for rash.  Neurological: Positive for dizziness. Negative for seizures and headaches.  Psychiatric/Behavioral: Negative for hallucinations.      Allergies  Statins  Home Medications   Prior to Admission medications   Medication Sig Start Date End Date Taking? Authorizing Provider  acetaminophen (TYLENOL) 500 MG tablet Take 1,000 mg by mouth every 6 (six) hours as needed for moderate pain or headache.    Historical Provider, MD  aspirin EC 81 MG tablet Take 81 mg by mouth daily.      Historical Provider, MD  Multiple Vitamin (MULTIVITAMIN WITH MINERALS) TABS tablet Take 1 tablet by mouth daily.    Historical Provider, MD  Omega-3 Fatty Acids (FISH OIL) 1000 MG CAPS Take 1 capsule by mouth daily.      Historical Provider, MD  Tetrahydrozoline HCl (VISINE EXTRA OP) Apply 2 drops to eye daily.    Historical Provider, MD  verapamil (CALAN) 80 MG tablet Take 1 tablet (80 mg total) by mouth 3 (three) times daily. 09/14/13   Evalee Jefferson, PA-C   BP 134/89  Pulse 90  Temp(Src) 98.7 F (37.1 C) (Oral)  Resp 16  Ht 5\' 1"  (1.549 m)  Wt 130 lb (58.968 kg)  BMI 24.58 kg/m2  SpO2 100% Physical Exam  Nursing note and vitals reviewed. Constitutional: She is oriented to person, place, and time. She appears well-developed.  Moderate anxiety   HENT:  Head: Normocephalic.  Eyes: Conjunctivae and EOM are normal. No scleral icterus.  Neck: Neck supple. No thyromegaly present.  Cardiovascular: Normal rate and regular rhythm.  Exam reveals no gallop and no friction rub.   No murmur heard. Pulmonary/Chest: No stridor. She has no wheezes. She has no rales. She exhibits no  tenderness.  Abdominal: She exhibits no distension. There is no tenderness. There is no rebound.  Musculoskeletal: Normal range of motion. She exhibits no edema.  Lymphadenopathy:    She has no cervical adenopathy.  Neurological: She is oriented to person, place, and time. She exhibits normal muscle tone. Coordination normal.  Skin: No rash noted. No erythema.  Psychiatric: She has a normal mood and affect. Her behavior is normal.    ED Course  Procedures (including critical care time)  DIAGNOSTIC STUDIES: Oxygen Saturation is 100% on RA, normal by my interpretation.    COORDINATION OF CARE:  8:12 AM Will order labs and IV fluids.  Patient acknowledges and agrees with plan.    Labs Review Labs Reviewed  BASIC METABOLIC PANEL - Abnormal; Notable for the following:    Potassium 3.4 (*)    Glucose, Bld 121 (*)    GFR calc non Af Amer 71 (*)    GFR calc Af Amer 82 (*)    All other components within normal limits  CBC WITH DIFFERENTIAL  TROPONIN I    Imaging Review No results found.   EKG Interpretation None      MDM   Final diagnoses:  None    Htn,   Decreased bp med.  To twice  A day  The chart was scribed for me under my direct supervision.  I personally performed the history, physical, and medical decision making and all procedures in the evaluation of this patient.Maudry Diego, MD 09/17/13 1021

## 2013-09-17 NOTE — Discharge Instructions (Signed)
Decrease bp medicine to one pill twice a day

## 2013-09-29 ENCOUNTER — Encounter (HOSPITAL_COMMUNITY): Payer: Self-pay | Admitting: Emergency Medicine

## 2013-09-29 ENCOUNTER — Emergency Department (HOSPITAL_COMMUNITY)
Admission: EM | Admit: 2013-09-29 | Discharge: 2013-09-29 | Disposition: A | Discharge status: Home / Self Care | Payer: Self-pay | Attending: Emergency Medicine | Admitting: Emergency Medicine

## 2013-09-29 DIAGNOSIS — Z8739 Personal history of other diseases of the musculoskeletal system and connective tissue: Secondary | ICD-10-CM | POA: Insufficient documentation

## 2013-09-29 DIAGNOSIS — Z862 Personal history of diseases of the blood and blood-forming organs and certain disorders involving the immune mechanism: Secondary | ICD-10-CM | POA: Insufficient documentation

## 2013-09-29 DIAGNOSIS — Z8639 Personal history of other endocrine, nutritional and metabolic disease: Secondary | ICD-10-CM | POA: Insufficient documentation

## 2013-09-29 DIAGNOSIS — Z79899 Other long term (current) drug therapy: Secondary | ICD-10-CM | POA: Insufficient documentation

## 2013-09-29 DIAGNOSIS — I10 Essential (primary) hypertension: Secondary | ICD-10-CM | POA: Insufficient documentation

## 2013-09-29 NOTE — ED Provider Notes (Signed)
CSN: [id number]     Arrival date & time 09/29/13  1314 History  This chart was scribed for [facility], [place], working with Fredia Sorrow MD found by Starleen Arms, ED Scribe. This patient was seen in room APFT22/APFT22 and the patient's care was started at 2:38 PM.    Chief Complaint  Patient presents with  . Hypertension   The history is provided by the patient. No language interpreter was used.    HPI Comments: Crystal Cooke is a 63 y.o. female who has multiple previous ED visits here and at [facility] for concerns regarding her BP.  She presents to the Emergency Department today complaining of a hypertensive episode.  Patient reports she took her BP this morning and it was elevated.  She does not recall what it was measured at.  Patient reports several recent ED visits and has had several recent alterations of her BP medication, Verapimil,  in an ED setting. She reports being taken to [facility] due to severely high BP and treated with labetalol and then being d/c home with Verapamil 80 mg. TID. She came to [facility] ED because her BP was dropping to low and was told to decrease the medication to twice a day. She went to [facility] ED and they gave her 100 mg ER and told her to stop the other and just take this once a day. She is here today stating that her blood pressure is not controlled with the once a day medication and has been elevated today.  She states that she has been unable to be seen by her doctor at the [facility], but has one scheduled for tomorrow (8/25).     Past Medical History  Diagnosis Date  . Hypertension   . Hypercholesteremia   . Atrial fibrillation   . DDD (degenerative disc disease), lumbar    History reviewed. No pertinent past surgical history. Family History  Problem Relation Age of Onset  . Cancer Mother   . Hypertension Mother   . Diabetes Father   . Hypertension Father    History  Substance Use Topics  . Smoking status: Never Smoker   .  Smokeless tobacco: Never Used  . Alcohol Use: No   OB History   Grav Para Term Preterm Abortions TAB SAB Ect Mult Living   5 5 5       5      Review of Systems  Constitutional:       Elevated BP  All other systems reviewed and are negative.     Allergies  Statins  Home Medications   Prior to Admission medications   Medication Sig Start Date End Date Taking? Authorizing Provider  aspirin EC 81 MG tablet Take 81 mg by mouth daily.      Historical Provider, MD  Multiple Vitamin (MULTIVITAMIN WITH MINERALS) TABS tablet Take 1 tablet by mouth daily.    Historical Provider, MD  Omega-3 Fatty Acids (FISH OIL) 1000 MG CAPS Take 1 capsule by mouth daily.      Historical Provider, MD  Tetrahydrozoline HCl (VISINE EXTRA OP) Apply 2 drops to eye daily as needed (Dry eyes).     Historical Provider, MD  verapamil (CALAN) 80 MG tablet Take 1 tablet (80 mg total) by mouth 3 (three) times daily. 09/14/13   Evalee Jefferson, PA-C   BP 143/95  Pulse 103  Temp(Src) 99.3 F (37.4 C) (Oral)  Resp 18  Ht 5\' 1"  (1.549 m)  Wt 130 lb (58.968  kg)  BMI 24.58 kg/m2  SpO2 100% Physical Exam  Nursing note and vitals reviewed. Constitutional: She is oriented to person, place, and time. She appears well-developed and well-nourished. No distress.  HENT:  Head: Normocephalic and atraumatic.  Eyes: Conjunctivae and EOM are normal.  Neck: Normal range of motion. Neck supple. No tracheal deviation present.  No JVD.  No carotid bruit.  Cardiovascular: Normal rate, regular rhythm and normal heart sounds.   Pulmonary/Chest: Effort normal and breath sounds normal. No respiratory distress. She has no wheezes. She has no rales.  Abdominal: There is no tenderness.  Musculoskeletal: Normal range of motion.  Neurological: She is alert and oriented to person, place, and time.  Skin: Skin is warm and dry.  Psychiatric: She has a normal mood and affect. Her behavior is normal.    ED Course  Procedures (including  critical care time)\  DIAGNOSTIC STUDIES: Oxygen Saturation is 100% on RA, normal by my interpretation.    COORDINATION OF CARE:  2:47 PM Discussed plan to monitor patients BP.  Patient acknowledges and agrees with plan.    3:50 PM Upon recheck, patient reports she feels well.  Patient advised to take Verapamil tonight as prescribed and follow-up with her doctor at [facility] Department tomorrow.       MDM  I discussed this case with Dr. Rogene Houston 63 y.o. female with hx of HTN and recent medication changes due to elevated BP. Stable for discharge to follow up with her PCP tomorrow as scheduled. Discussed with the patient and all questioned fully answered. She will return if any problems arise.    [place], [place] 09/29/13 1946

## 2013-09-29 NOTE — ED Notes (Signed)
Pt also c/o dizziness.

## 2013-09-29 NOTE — Discharge Instructions (Signed)
Take the blood pressure medication as directed. Follow up with your doctor tomorrow as schedule to let her adjust the medication or decide to add or change your medication.   Hypertension Hypertension, commonly called high blood pressure, is when the force of blood pumping through your arteries is too strong. Your arteries are the blood vessels that carry blood from your heart throughout your body. A blood pressure reading consists of a higher number over a lower number, such as 110/72. The higher number (systolic) is the pressure inside your arteries when your heart pumps. The lower number (diastolic) is the pressure inside your arteries when your heart relaxes. Ideally you want your blood pressure below 120/80. Hypertension forces your heart to work harder to pump blood. Your arteries may become narrow or stiff. Having hypertension puts you at risk for heart disease, stroke, and other problems.  RISK FACTORS Some risk factors for high blood pressure are controllable. Others are not.  Risk factors you cannot control include:   Race. You may be at higher risk if you are African American.  Age. Risk increases with age.  Gender. Men are at higher risk than women before age 46 years. After age 35, women are at higher risk than men. Risk factors you can control include:  Not getting enough exercise or physical activity.  Being overweight.  Getting too much fat, sugar, calories, or salt in your diet.  Drinking too much alcohol. SIGNS AND SYMPTOMS Hypertension does not usually cause signs or symptoms. Extremely high blood pressure (hypertensive crisis) may cause headache, anxiety, shortness of breath, and nosebleed. DIAGNOSIS  To check if you have hypertension, your health care provider will measure your blood pressure while you are seated, with your arm held at the level of your heart. It should be measured at least twice using the same arm. Certain conditions can cause a difference in blood  pressure between your right and left arms. A blood pressure reading that is higher than normal on one occasion does not mean that you need treatment. If one blood pressure reading is high, ask your health care provider about having it checked again. TREATMENT  Treating high blood pressure includes making lifestyle changes and possibly taking medicine. Living a healthy lifestyle can help lower high blood pressure. You may need to change some of your habits. Lifestyle changes may include:  Following the DASH diet. This diet is high in fruits, vegetables, and whole grains. It is low in salt, red meat, and added sugars.  Getting at least 2 hours of brisk physical activity every week.  Losing weight if necessary.  Not smoking.  Limiting alcoholic beverages.  Learning ways to reduce stress. If lifestyle changes are not enough to get your blood pressure under control, your health care provider may prescribe medicine. You may need to take more than one. Work closely with your health care provider to understand the risks and benefits. HOME CARE INSTRUCTIONS  Have your blood pressure rechecked as directed by your health care provider.   Take medicines only as directed by your health care provider. Follow the directions carefully. Blood pressure medicines must be taken as prescribed. The medicine does not work as well when you skip doses. Skipping doses also puts you at risk for problems.   Do not smoke.   Monitor your blood pressure at home as directed by your health care provider. SEEK MEDICAL CARE IF:   You think you are having a reaction to medicines taken.  You have recurrent  headaches or feel dizzy.  You have swelling in your ankles.  You have trouble with your vision. SEEK IMMEDIATE MEDICAL CARE IF:  You develop a severe headache or confusion.  You have unusual weakness, numbness, or feel faint.  You have severe chest or abdominal pain.  You vomit repeatedly.  You have  trouble breathing. MAKE SURE YOU:   Understand these instructions.  Will watch your condition.  Will get help right away if you are not doing well or get worse. Document Released: 01/23/2005 Document Revised: 06/09/2013 Document Reviewed: 11/15/2012 [facility] Patient Information 2015 [place], [place]. This information is not intended to replace advice given to you by your health care provider. Make sure you discuss any questions you have with your health care provider.

## 2013-09-29 NOTE — ED Notes (Signed)
Pt c/o HTN

## 2013-09-30 NOTE — ED Provider Notes (Signed)
Medical screening examination/treatment/procedure(s) were performed by non-physician practitioner and as supervising physician I was immediately available for consultation/collaboration.   EKG Interpretation None        Fredia Sorrow, MD 09/30/13 1520

## 2013-10-18 ENCOUNTER — Emergency Department (HOSPITAL_COMMUNITY): Payer: Self-pay

## 2013-10-18 ENCOUNTER — Encounter (HOSPITAL_COMMUNITY): Payer: Self-pay | Admitting: Emergency Medicine

## 2013-10-18 ENCOUNTER — Emergency Department (HOSPITAL_COMMUNITY)
Admission: EM | Admit: 2013-10-18 | Discharge: 2013-10-19 | Disposition: A | Discharge status: Home / Self Care | Payer: Self-pay | Attending: Emergency Medicine | Admitting: Emergency Medicine

## 2013-10-18 DIAGNOSIS — R51 Headache: Secondary | ICD-10-CM | POA: Insufficient documentation

## 2013-10-18 DIAGNOSIS — I10 Essential (primary) hypertension: Secondary | ICD-10-CM | POA: Insufficient documentation

## 2013-10-18 DIAGNOSIS — Z862 Personal history of diseases of the blood and blood-forming organs and certain disorders involving the immune mechanism: Secondary | ICD-10-CM | POA: Insufficient documentation

## 2013-10-18 DIAGNOSIS — Z7982 Long term (current) use of aspirin: Secondary | ICD-10-CM | POA: Insufficient documentation

## 2013-10-18 DIAGNOSIS — R079 Chest pain, unspecified: Secondary | ICD-10-CM | POA: Insufficient documentation

## 2013-10-18 DIAGNOSIS — Z8739 Personal history of other diseases of the musculoskeletal system and connective tissue: Secondary | ICD-10-CM | POA: Insufficient documentation

## 2013-10-18 DIAGNOSIS — R0602 Shortness of breath: Secondary | ICD-10-CM | POA: Insufficient documentation

## 2013-10-18 DIAGNOSIS — Z79899 Other long term (current) drug therapy: Secondary | ICD-10-CM | POA: Insufficient documentation

## 2013-10-18 DIAGNOSIS — Z8639 Personal history of other endocrine, nutritional and metabolic disease: Secondary | ICD-10-CM | POA: Insufficient documentation

## 2013-10-18 LAB — CBC WITH DIFFERENTIAL/PLATELET
BASOS PCT: 0 % (ref 0–1)
Basophils Absolute: 0 10*3/uL (ref 0.0–0.1)
EOS ABS: 0.1 10*3/uL (ref 0.0–0.7)
EOS PCT: 1 % (ref 0–5)
HCT: 36.6 % (ref 36.0–46.0)
Hemoglobin: 12.3 g/dL (ref 12.0–15.0)
LYMPHS ABS: 2.2 10*3/uL (ref 0.7–4.0)
Lymphocytes Relative: 41 % (ref 12–46)
MCH: 31.6 pg (ref 26.0–34.0)
MCHC: 33.6 g/dL (ref 30.0–36.0)
MCV: 94.1 fL (ref 78.0–100.0)
Monocytes Absolute: 0.3 10*3/uL (ref 0.1–1.0)
Monocytes Relative: 6 % (ref 3–12)
Neutro Abs: 2.8 10*3/uL (ref 1.7–7.7)
Neutrophils Relative %: 52 % (ref 43–77)
PLATELETS: 213 10*3/uL (ref 150–400)
RBC: 3.89 MIL/uL (ref 3.87–5.11)
RDW: 12.6 % (ref 11.5–15.5)
WBC: 5.5 10*3/uL (ref 4.0–10.5)

## 2013-10-18 LAB — BASIC METABOLIC PANEL
Anion gap: 11 (ref 5–15)
BUN: 19 mg/dL (ref 6–23)
CALCIUM: 9.5 mg/dL (ref 8.4–10.5)
CO2: 26 mEq/L (ref 19–32)
Chloride: 99 mEq/L (ref 96–112)
Creatinine, Ser: 1.17 mg/dL — ABNORMAL HIGH (ref 0.50–1.10)
GFR, EST AFRICAN AMERICAN: 56 mL/min — AB (ref >90)
GFR, EST NON AFRICAN AMERICAN: 48 mL/min — AB (ref >90)
GLUCOSE: 105 mg/dL — AB (ref 70–99)
Potassium: 4.2 mEq/L (ref 3.7–5.3)
SODIUM: 136 meq/L — AB (ref 137–147)

## 2013-10-18 LAB — I-STAT TROPONIN, ED: TROPONIN I, POC: 0 ng/mL (ref 0.00–0.08)

## 2013-10-18 NOTE — ED Notes (Signed)
Dr. Colin Rhein at [facility]

## 2013-10-18 NOTE — ED Provider Notes (Signed)
CSN: [id number]     Arrival date & time 10/18/13  2014 History   First MD Initiated Contact with Patient 10/18/13 2158     Chief Complaint  Patient presents with  . Hypertension     (Consider location/radiation/quality/duration/timing/severity/associated sxs/prior Treatment) Patient is a 62 y.o. female presenting with hypertension.  Hypertension This is a chronic problem. The current episode started 1 to 2 hours ago. The problem occurs constantly. The problem has not changed since onset.Associated symptoms include chest pain, headaches and shortness of breath. Nothing aggravates the symptoms. Nothing relieves the symptoms. She has tried nothing for the symptoms.    Past Medical History  Diagnosis Date  . Hypertension   . Hypercholesteremia   . Atrial fibrillation   . DDD (degenerative disc disease), lumbar    History reviewed. No pertinent past surgical history. Family History  Problem Relation Age of Onset  . Cancer Mother   . Hypertension Mother   . Diabetes Father   . Hypertension Father    History  Substance Use Topics  . Smoking status: Never Smoker   . Smokeless tobacco: Never Used  . Alcohol Use: No   OB History   Grav Para Term Preterm Abortions TAB SAB Ect Mult Living   5 5 5       5      Review of Systems  Respiratory: Positive for shortness of breath.   Cardiovascular: Positive for chest pain.  Neurological: Positive for headaches.  All other systems reviewed and are negative.     Allergies  Statins  Home Medications   Prior to Admission medications   Medication Sig Start Date End Date Taking? Authorizing Provider  aspirin EC 81 MG tablet Take 81 mg by mouth daily.      Historical Provider, MD  LORazepam (ATIVAN) 1 MG tablet Take 1 mg by mouth at bedtime.    Historical Provider, MD  Multiple Vitamin (MULTIVITAMIN WITH MINERALS) TABS tablet Take 1 tablet by mouth daily.    Historical Provider, MD  Omega-3 Fatty Acids (FISH OIL) 1000 MG CAPS Take 1  capsule by mouth daily.      Historical Provider, MD  Tetrahydrozoline HCl (VISINE EXTRA OP) Apply 2 drops to eye daily as needed (Dry eyes).     Historical Provider, MD  verapamil (VERELAN) 100 MG 24 hr capsule Take 100 mg by mouth every evening.    Historical Provider, MD   BP 138/84  Pulse 82  Temp(Src) 97.9 F (36.6 C) (Oral)  Resp 23  Ht 5\' 1"  (1.549 m)  Wt 123 lb (55.792 kg)  BMI 23.25 kg/m2  SpO2 100% Physical Exam  Vitals reviewed. Constitutional: She is oriented to person, place, and time. She appears well-developed and well-nourished.  HENT:  Head: Normocephalic and atraumatic.  Right Ear: External ear normal.  Left Ear: External ear normal.  Eyes: Conjunctivae and EOM are normal. Pupils are equal, round, and reactive to light.  Neck: Normal range of motion. Neck supple.  Cardiovascular: Normal rate, regular rhythm, normal heart sounds and intact distal pulses.   Pulmonary/Chest: Effort normal and breath sounds normal.  Abdominal: Soft. Bowel sounds are normal. There is no tenderness.  Musculoskeletal: Normal range of motion.  Neurological: She is alert and oriented to person, place, and time.  MAE  Skin: Skin is warm and dry.    ED Course  Procedures (including critical care time) Labs Review Labs Reviewed  BASIC METABOLIC PANEL - Abnormal; Notable for the following:    Sodium 136 (*)  Glucose, Bld 105 (*)    Creatinine, Ser 1.17 (*)    GFR calc non Af Amer 48 (*)    GFR calc Af Amer 56 (*)    All other components within normal limits  CBC WITH DIFFERENTIAL  Randolm Idol, ED    Imaging Review Dg Chest 2 View  10/19/2013   CLINICAL DATA:  Dizziness and headaches.  High blood pressure.  EXAM: CHEST  2 VIEW  COMPARISON:  09/27/2013.  FINDINGS: The cardiac silhouette, mediastinal and hilar contours are normal and stable. There is mild tortuosity of the thoracic aorta. The lungs are clear. No pleural effusion. The bony thorax is intact.  IMPRESSION: No  acute cardiopulmonary findings.   Electronically Signed   By: Kalman Jewels M.D.   On: 10/19/2013 00:00     EKG Interpretation None      MDM   Final diagnoses:  Essential hypertension    63 y.o. female  with pertinent PMH of HTN, afib presents with dizziness, mild ha in context of HTN.  Patient has been seen numerous times in the past due to concern for the number of her blood pressure. She states that she has been asymptomatic, however has a long-standing history of hypertension which is refractory to many different therapies. She's currently on verapamil 100 mg extended release.  She began to have some dizziness and a very mild headache today and noticed her blood pressure was 237 systolic.  She presented due to concern for the elevation in the number, rather than a daily of symptoms. On arrival vital signs and physical exam as above. Blood pressure improved to 136/90 during my examination. I attempted to explain to the patient that the number of the was less concerning in the short run, and that I was more concerned do to her symptoms of intermittent sharp chest pain and mild headache. Subsequently lab work was obtained as above.  This was unremarkable and the pt had not had chest pain in over 1 week.  DC home in stable condition to fu with PCP re: mild elevation in cr.   Labs and imaging as above reviewed.   1. Essential hypertension         Debby Freiberg, MD 10/19/13 1213

## 2013-10-18 NOTE — ED Notes (Signed)
Patient transported to X-ray 

## 2013-10-18 NOTE — ED Notes (Signed)
Her bp has run been running high for over a month.  Her doctor added another med one week ago.  Her bp is still running high and she has had dizziness and she does not think the med are working .  She can nly take one a day.  She has a headache

## 2013-10-19 NOTE — ED Notes (Signed)
Patient returned from XR. 

## 2013-10-19 NOTE — Discharge Instructions (Signed)

## 2013-10-20 ENCOUNTER — Encounter (HOSPITAL_COMMUNITY): Payer: Self-pay | Admitting: Emergency Medicine

## 2013-10-20 ENCOUNTER — Emergency Department (INDEPENDENT_AMBULATORY_CARE_PROVIDER_SITE_OTHER)
Admission: EM | Admit: 2013-10-20 | Discharge: 2013-10-20 | Disposition: A | Discharge status: Nursing Home (Includes ICF) | Payer: Self-pay | Source: Home / Self Care | Attending: Emergency Medicine | Admitting: Emergency Medicine

## 2013-10-20 ENCOUNTER — Emergency Department (HOSPITAL_COMMUNITY): Payer: Self-pay

## 2013-10-20 ENCOUNTER — Emergency Department (HOSPITAL_COMMUNITY)
Admission: EM | Admit: 2013-10-20 | Discharge: 2013-10-20 | Disposition: A | Discharge status: Home / Self Care | Payer: Self-pay | Attending: Emergency Medicine | Admitting: Emergency Medicine

## 2013-10-20 DIAGNOSIS — R319 Hematuria, unspecified: Secondary | ICD-10-CM | POA: Insufficient documentation

## 2013-10-20 DIAGNOSIS — R300 Dysuria: Secondary | ICD-10-CM | POA: Insufficient documentation

## 2013-10-20 DIAGNOSIS — Z7982 Long term (current) use of aspirin: Secondary | ICD-10-CM | POA: Insufficient documentation

## 2013-10-20 DIAGNOSIS — R000 Tachycardia, unspecified: Secondary | ICD-10-CM | POA: Insufficient documentation

## 2013-10-20 DIAGNOSIS — Z87442 Personal history of urinary calculi: Secondary | ICD-10-CM | POA: Insufficient documentation

## 2013-10-20 DIAGNOSIS — M549 Dorsalgia, unspecified: Secondary | ICD-10-CM | POA: Insufficient documentation

## 2013-10-20 DIAGNOSIS — I4891 Unspecified atrial fibrillation: Secondary | ICD-10-CM | POA: Insufficient documentation

## 2013-10-20 DIAGNOSIS — I10 Essential (primary) hypertension: Secondary | ICD-10-CM | POA: Insufficient documentation

## 2013-10-20 DIAGNOSIS — R109 Unspecified abdominal pain: Secondary | ICD-10-CM

## 2013-10-20 DIAGNOSIS — Z8639 Personal history of other endocrine, nutritional and metabolic disease: Secondary | ICD-10-CM | POA: Insufficient documentation

## 2013-10-20 DIAGNOSIS — Z862 Personal history of diseases of the blood and blood-forming organs and certain disorders involving the immune mechanism: Secondary | ICD-10-CM | POA: Insufficient documentation

## 2013-10-20 DIAGNOSIS — Z79899 Other long term (current) drug therapy: Secondary | ICD-10-CM | POA: Insufficient documentation

## 2013-10-20 DIAGNOSIS — R110 Nausea: Secondary | ICD-10-CM | POA: Insufficient documentation

## 2013-10-20 LAB — URINE MICROSCOPIC-ADD ON

## 2013-10-20 LAB — URINALYSIS, ROUTINE W REFLEX MICROSCOPIC
Bilirubin Urine: NEGATIVE
Glucose, UA: NEGATIVE mg/dL
Ketones, ur: 15 mg/dL — AB
Leukocytes, UA: NEGATIVE
Nitrite: NEGATIVE
Protein, ur: NEGATIVE mg/dL
Specific Gravity, Urine: >1.03 — ABNORMAL HIGH (ref 1.005–1.030)
Urobilinogen, UA: 0.2 mg/dL (ref 0.0–1.0)
pH: 6.5 (ref 5.0–8.0)

## 2013-10-20 LAB — CBC WITH DIFFERENTIAL/PLATELET
BASOS ABS: 0 10*3/uL (ref 0.0–0.1)
BASOS PCT: 0 % (ref 0–1)
EOS ABS: 0 10*3/uL (ref 0.0–0.7)
Eosinophils Relative: 0 % (ref 0–5)
HCT: 38.7 % (ref 36.0–46.0)
Hemoglobin: 13 g/dL (ref 12.0–15.0)
Lymphocytes Relative: 27 % (ref 12–46)
Lymphs Abs: 1.5 10*3/uL (ref 0.7–4.0)
MCH: 31.6 pg (ref 26.0–34.0)
MCHC: 33.6 g/dL (ref 30.0–36.0)
MCV: 93.9 fL (ref 78.0–100.0)
MONO ABS: 0.2 10*3/uL (ref 0.1–1.0)
MONOS PCT: 4 % (ref 3–12)
NEUTROS PCT: 69 % (ref 43–77)
Neutro Abs: 3.9 10*3/uL (ref 1.7–7.7)
Platelets: 214 10*3/uL (ref 150–400)
RBC: 4.12 MIL/uL (ref 3.87–5.11)
RDW: 12.5 % (ref 11.5–15.5)
WBC: 5.7 10*3/uL (ref 4.0–10.5)

## 2013-10-20 LAB — COMPREHENSIVE METABOLIC PANEL
ALBUMIN: 4.4 g/dL (ref 3.5–5.2)
ALT: 14 U/L (ref 0–35)
AST: 18 U/L (ref 0–37)
Alkaline Phosphatase: 108 U/L (ref 39–117)
Anion gap: 13 (ref 5–15)
BILIRUBIN TOTAL: 0.8 mg/dL (ref 0.3–1.2)
BUN: 9 mg/dL (ref 6–23)
CHLORIDE: 102 meq/L (ref 96–112)
CO2: 26 mEq/L (ref 19–32)
CREATININE: 0.84 mg/dL (ref 0.50–1.10)
Calcium: 10 mg/dL (ref 8.4–10.5)
GFR calc Af Amer: 83 mL/min — ABNORMAL LOW (ref >90)
GFR calc non Af Amer: 72 mL/min — ABNORMAL LOW (ref >90)
Glucose, Bld: 93 mg/dL (ref 70–99)
POTASSIUM: 4 meq/L (ref 3.7–5.3)
Sodium: 141 mEq/L (ref 137–147)
TOTAL PROTEIN: 7.6 g/dL (ref 6.0–8.3)

## 2013-10-20 LAB — POCT URINALYSIS DIP (DEVICE)
Bilirubin Urine: NEGATIVE
Glucose, UA: NEGATIVE mg/dL
Ketones, ur: NEGATIVE mg/dL
Nitrite: NEGATIVE
Protein, ur: NEGATIVE mg/dL
Urobilinogen, UA: 0.2 mg/dL (ref 0.0–1.0)
pH: 7 (ref 5.0–8.0)

## 2013-10-20 LAB — I-STAT CHEM 8, ED
BUN: 8 mg/dL (ref 6–23)
Calcium, Ion: 1.21 mmol/L (ref 1.13–1.30)
Chloride: 104 mEq/L (ref 96–112)
Creatinine, Ser: 0.9 mg/dL (ref 0.50–1.10)
Glucose, Bld: 94 mg/dL (ref 70–99)
HCT: 43 % (ref 36.0–46.0)
Hemoglobin: 14.6 g/dL (ref 12.0–15.0)
Potassium: 3.9 mEq/L (ref 3.7–5.3)
Sodium: 140 mEq/L (ref 137–147)
TCO2: 25 mmol/L (ref 0–100)

## 2013-10-20 LAB — I-STAT TROPONIN, ED: Troponin i, poc: 0 ng/mL (ref 0.00–0.08)

## 2013-10-20 LAB — LIPASE, BLOOD: Lipase: 23 U/L (ref 11–59)

## 2013-10-20 MED ORDER — KETOROLAC TROMETHAMINE 60 MG/2ML IM SOLN
INTRAMUSCULAR | Status: AC
  Filled 2013-10-20: qty 2

## 2013-10-20 MED ORDER — PROMETHAZINE HCL 25 MG PO TABS: 25.0000 mg | ORAL_TABLET | Freq: Four times a day (QID) | ORAL | Status: DC | PRN

## 2013-10-20 MED ORDER — IOHEXOL 350 MG/ML SOLN
80.0000 mL | Freq: Once | INTRAVENOUS | Status: AC | PRN
  Administered 2013-10-20: 80 mL via INTRAVENOUS

## 2013-10-20 MED ORDER — KETOROLAC TROMETHAMINE 60 MG/2ML IM SOLN
60.0000 mg | Freq: Once | INTRAMUSCULAR | Status: AC
  Administered 2013-10-20: 60 mg via INTRAMUSCULAR

## 2013-10-20 MED ORDER — ONDANSETRON HCL 4 MG/2ML IJ SOLN
4.0000 mg | Freq: Once | INTRAMUSCULAR | Status: AC
  Administered 2013-10-20: 4 mg via INTRAVENOUS
  Filled 2013-10-20: qty 2

## 2013-10-20 MED ORDER — HYDROCODONE-ACETAMINOPHEN 5-325 MG PO TABS: 1.0000 | ORAL_TABLET | Freq: Four times a day (QID) | ORAL | Status: DC | PRN

## 2013-10-20 MED ORDER — HYDROMORPHONE HCL PF 1 MG/ML IJ SOLN
1.0000 mg | Freq: Once | INTRAMUSCULAR | Status: AC
  Administered 2013-10-20: 1 mg via INTRAVENOUS
  Filled 2013-10-20: qty 1

## 2013-10-20 NOTE — ED Provider Notes (Signed)
Medical screening examination/treatment/procedure(s) were performed by non-physician practitioner and as supervising physician I was immediately available for consultation/collaboration.  Philipp Deputy, M.D.  Harden Mo, MD 10/20/13 [phone]

## 2013-10-20 NOTE — ED Provider Notes (Signed)
CSN: [id number]     Arrival date & time 10/20/13  1031 History   First MD Initiated Contact with Patient 10/20/13 1246     Chief Complaint  Patient presents with  . Back Pain     (Consider location/radiation/quality/duration/timing/severity/associated sxs/prior Treatment) HPI Pt is a 63yo female with hx of HTN, hypercholesteremia, afib, and DDD presenting to ED from urgent care for further evaluation and treatment of back and right sided flank pain, concern at [facility] for renal stones vs AAA.  Pt has hx of renal stones based off a a UA and CT scan in 09/2012. Presenting today with c/o right sided flank pain and lower abdominal pain associated with urinary frequency and dysuria gradually worsening over last 2 days.  Pt states lower back pain has been gradually worsening x1 month.  Pain is 9/10, sharp stabbing pain.  Denies fever, chills, n/v/d. Denies vaginal symptoms.  Pt states she does take BP medication for her HTN but states "it doesn't seem to help" but is taking her medication as prescribed.     Past Medical History  Diagnosis Date  . Hypertension   . Hypercholesteremia   . Atrial fibrillation   . DDD (degenerative disc disease), lumbar    History reviewed. No pertinent past surgical history. Family History  Problem Relation Age of Onset  . Cancer Mother   . Hypertension Mother   . Diabetes Father   . Hypertension Father    History  Substance Use Topics  . Smoking status: Never Smoker   . Smokeless tobacco: Never Used  . Alcohol Use: No   OB History   Grav Para Term Preterm Abortions TAB SAB Ect Mult Living   5 5 5       5      Review of Systems  Constitutional: Negative for fever and chills.  Respiratory: Negative for cough and shortness of breath.   Cardiovascular: Negative for chest pain and palpitations.  Gastrointestinal: Positive for abdominal pain ( right side). Negative for nausea, vomiting and diarrhea.  Genitourinary: Positive for dysuria, frequency and flank pain  ( right). Negative for urgency and hematuria.  Musculoskeletal: Positive for back pain ( lower, worse on right side). Negative for myalgias.  Neurological: Negative for dizziness, syncope, weakness, light-headedness, numbness and headaches.  All other systems reviewed and are negative.     Allergies  Statins  Home Medications   Prior to Admission medications   Medication Sig Start Date End Date Taking? Authorizing Provider  aspirin EC 81 MG tablet Take 81 mg by mouth daily.     Yes Historical Provider, MD  diltiazem (TIAZAC) 240 MG 24 hr capsule Take 240 mg by mouth daily.   Yes Historical Provider, MD  LORazepam (ATIVAN) 1 MG tablet Take 1 mg by mouth at bedtime.   Yes Historical Provider, MD  Multiple Vitamin (MULTIVITAMIN WITH MINERALS) TABS tablet Take 1 tablet by mouth daily.   Yes Historical Provider, MD  Omega-3 Fatty Acids (FISH OIL) 1000 MG CAPS Take 1 capsule by mouth daily.     Yes Historical Provider, MD  Tetrahydrozoline HCl (VISINE EXTRA OP) Place 2 drops into both eyes daily as needed (Dry eyes).    Yes Historical Provider, MD  HYDROcodone-acetaminophen (NORCO/VICODIN) 5-325 MG per tablet Take 1-2 tablets by mouth every 6 (six) hours as needed for moderate pain or severe pain. 10/20/13   Noland Fordyce, PA-C  promethazine (PHENERGAN) 25 MG tablet Take 1 tablet (25 mg total) by mouth every 6 (six) hours as  needed for nausea or vomiting. 10/20/13   Noland Fordyce, PA-C   BP 120/77  Pulse 76  Temp(Src) 98.3 F (36.8 C)  Resp 16  SpO2 97% Physical Exam  Nursing note and vitals reviewed. Constitutional: She appears well-developed and well-nourished.  Pt lying in exam bed, mild diffuse tremor (due to being cold vs nervous/anxiety)   HENT:  Head: Normocephalic and atraumatic.  Eyes: Conjunctivae are normal. No scleral icterus.  Neck: Normal range of motion.  Cardiovascular: Normal rate, regular rhythm and normal heart sounds.   Pulmonary/Chest: Effort normal and breath  sounds normal. No respiratory distress. She has no wheezes. She has no rales. She exhibits no tenderness.  Abdominal: Soft. Bowel sounds are normal. She exhibits mass. She exhibits no distension. There is tenderness. There is no rebound and no guarding.  Soft non-distended, thin abdomen. Visible and palpable midline pulsatile mass. Tenderness in right side of abdomen and suprapubic region. Right CVAT. No rebound or guarding.  Musculoskeletal: Normal range of motion.  Neurological: She is alert.  Skin: Skin is warm and dry.    ED Course  Procedures (including critical care time) Labs Review Labs Reviewed  COMPREHENSIVE METABOLIC PANEL - Abnormal; Notable for the following:    GFR calc non Af Amer 72 (*)    GFR calc Af Amer 83 (*)    All other components within normal limits  URINALYSIS, ROUTINE W REFLEX MICROSCOPIC - Abnormal; Notable for the following:    Specific Gravity, Urine >1.030 (*)    Hgb urine dipstick LARGE (*)    Ketones, ur 15 (*)    All other components within normal limits  URINE CULTURE  CBC WITH DIFFERENTIAL  LIPASE, BLOOD  URINE MICROSCOPIC-ADD ON  I-STAT CHEM 8, ED  Randolm Idol, ED    Imaging Review Dg Chest 2 View  10/19/2013   CLINICAL DATA:  Dizziness and headaches.  High blood pressure.  EXAM: CHEST  2 VIEW  COMPARISON:  09/27/2013.  FINDINGS: The cardiac silhouette, mediastinal and hilar contours are normal and stable. There is mild tortuosity of the thoracic aorta. The lungs are clear. No pleural effusion. The bony thorax is intact.  IMPRESSION: No acute cardiopulmonary findings.   Electronically Signed   By: Kalman Jewels M.D.   On: 10/19/2013 00:00   Results for orders placed during the hospital encounter of 10/20/13  CBC WITH DIFFERENTIAL      Result Value Ref Range   WBC 5.7  4.0 - 10.5 K/uL   RBC 4.12  3.87 - 5.11 MIL/uL   Hemoglobin 13.0  12.0 - 15.0 g/dL   HCT 38.7  36.0 - 46.0 %   MCV 93.9  78.0 - 100.0 fL   MCH 31.6  26.0 - 34.0 pg    MCHC 33.6  30.0 - 36.0 g/dL   RDW 12.5  11.5 - 15.5 %   Platelets 214  150 - 400 K/uL   Neutrophils Relative % 69  43 - 77 %   Neutro Abs 3.9  1.7 - 7.7 K/uL   Lymphocytes Relative 27  12 - 46 %   Lymphs Abs 1.5  0.7 - 4.0 K/uL   Monocytes Relative 4  3 - 12 %   Monocytes Absolute 0.2  0.1 - 1.0 K/uL   Eosinophils Relative 0  0 - 5 %   Eosinophils Absolute 0.0  0.0 - 0.7 K/uL   Basophils Relative 0  0 - 1 %   Basophils Absolute 0.0  0.0 - 0.1 K/uL  COMPREHENSIVE METABOLIC PANEL      Result Value Ref Range   Sodium 141  137 - 147 mEq/L   Potassium 4.0  3.7 - 5.3 mEq/L   Chloride 102  96 - 112 mEq/L   CO2 26  19 - 32 mEq/L   Glucose, Bld 93  70 - 99 mg/dL   BUN 9  6 - 23 mg/dL   Creatinine, Ser 0.84  0.50 - 1.10 mg/dL   Calcium 10.0  8.4 - 10.5 mg/dL   Total Protein 7.6  6.0 - 8.3 g/dL   Albumin 4.4  3.5 - 5.2 g/dL   AST 18  0 - 37 U/L   ALT 14  0 - 35 U/L   Alkaline Phosphatase 108  39 - 117 U/L   Total Bilirubin 0.8  0.3 - 1.2 mg/dL   GFR calc non Af Amer 72 (*) >90 mL/min   GFR calc Af Amer 83 (*) >90 mL/min   Anion gap 13  5 - 15  LIPASE, BLOOD      Result Value Ref Range   Lipase 23  11 - 59 U/L  URINALYSIS, ROUTINE W REFLEX MICROSCOPIC      Result Value Ref Range   Color, Urine YELLOW  YELLOW   APPearance CLEAR  CLEAR   Specific Gravity, Urine >1.030 (*) 1.005 - 1.030   pH 6.5  5.0 - 8.0   Glucose, UA NEGATIVE  NEGATIVE mg/dL   Hgb urine dipstick LARGE (*) NEGATIVE   Bilirubin Urine NEGATIVE  NEGATIVE   Ketones, ur 15 (*) NEGATIVE mg/dL   Protein, ur NEGATIVE  NEGATIVE mg/dL   Urobilinogen, UA 0.2  0.0 - 1.0 mg/dL   Nitrite NEGATIVE  NEGATIVE   Leukocytes, UA NEGATIVE  NEGATIVE  URINE MICROSCOPIC-ADD ON      Result Value Ref Range   Squamous Epithelial / LPF RARE  RARE   WBC, UA 0-2  <3 WBC/hpf   RBC / HPF 11-20  <3 RBC/hpf   Bacteria, UA RARE  RARE  I-STAT CHEM 8, ED      Result Value Ref Range   Sodium 140  137 - 147 mEq/L   Potassium 3.9  3.7 - 5.3  mEq/L   Chloride 104  96 - 112 mEq/L   BUN 8  6 - 23 mg/dL   Creatinine, Ser 0.90  0.50 - 1.10 mg/dL   Glucose, Bld 94  70 - 99 mg/dL   Calcium, Ion 1.21  1.13 - 1.30 mmol/L   TCO2 25  0 - 100 mmol/L   Hemoglobin 14.6  12.0 - 15.0 g/dL   HCT 43.0  36.0 - 46.0 %  I-STAT TROPOININ, ED      Result Value Ref Range   Troponin i, poc 0.00  0.00 - 0.08 ng/mL   Comment 3            Dg Chest 2 View  10/19/2013   CLINICAL DATA:  Dizziness and headaches.  High blood pressure.  EXAM: CHEST  2 VIEW  COMPARISON:  09/27/2013.  FINDINGS: The cardiac silhouette, mediastinal and hilar contours are normal and stable. There is mild tortuosity of the thoracic aorta. The lungs are clear. No pleural effusion. The bony thorax is intact.  IMPRESSION: No acute cardiopulmonary findings.   Electronically Signed   By: Kalman Jewels M.D.   On: 10/19/2013 00:00   Ct Angio Chest Aorta W/cm &/or Wo/cm  10/20/2013   CLINICAL DATA:  Back pain  EXAM: CT ANGIOGRAPHY  CHEST, ABDOMEN AND PELVIS  TECHNIQUE: Multidetector CT imaging through the chest, abdomen and pelvis was performed using the standard protocol during bolus administration of intravenous contrast. Multiplanar reconstructed images and MIPs were obtained and reviewed to evaluate the vascular anatomy.  CONTRAST:  62mL OMNIPAQUE IOHEXOL 350 MG/ML SOLN  COMPARISON:  09/22/2012  FINDINGS: CTA CHEST FINDINGS  No evidence of intramural hematoma, aortic dissection, or aortic transection. Motion artifact is noted in the ascending aorta. Maximal diameter of the ascending aorta is 3.3 cm.  No obvious filling defect in the pulmonary arterial tree to suggest acute pulmonary thromboembolism.  Great vessels are patent within the confines of the exam. Visualized vertebral arteries are patent.  No abnormal mediastinal adenopathy.  Clear lungs  No acute bony deformity.  Review of the MIP images confirms the above findings.  CTA ABDOMEN AND PELVIS FINDINGS  No evidence of aortic dissection  or aneurysm. Minimal atherosclerotic calcifications of the abdominal aorta are noted.  Celiac is patent. Branch vessels are patent. Accessory left hepatic artery.  SMA is patent.  Branch vessels are patent.  IMA is patent and diminutive.  A single right renal artery and 2 left renal arteries are patent.  Bilateral common iliac, external iliac, and internal iliac arteries are patent.  Innumerable liver cysts are stable.  Gallbladder, spleen, adrenal glands are within normal limits.  Small hypodensities in the kidneys are not significantly changed allowing for differences in technique. There is one hyperdensity in the left kidney measuring 7 mm on image 147 of series 501 that is stable.  There is a 2.8 cine m a lobulated cystic abnormality in the tail of the pancreas that is stable in size. There is a sub cm hypodensity in the uncinate process of the pancreas on image 163 which is probably not significantly changed. The pancreatic duct measures 5 mm in caliber which is mildly dilated. This is also stable.  Advanced degenerative change in the lower lumbar spine is not significantly changed. No vertebral compression deformity  Bladder, uterus, and adnexa are within normal limits.  No free-fluid.  No abnormal retroperitoneal adenopathy.  Review of the MIP images confirms the above findings.  IMPRESSION: No evidence of thoracic or abdominal aortic aneurysm or dissection. No pulmonary embolism. No intramural hematoma.  Innumerable cysts in the liver are stable. Hypodensities, likely cysts, in the kidneys are also not significantly changed.  There are at least 2 low-density abnormalities within the pancreas likely cystic lesions. There are not significantly changed dating back 1 year. The pancreatic duct is dilated which is also a stable finding. One year follow-up was recommended to ensure stability as stability over 2 years supports benign etiology. If there is strong concern for pancreatic malignancy, MRI of the pancreas  can be performed to further delineate.   Electronically Signed   By: Maryclare Bean M.D.   On: 10/20/2013 14:38   Ct Angio Abd/pel W/ And/or W/o  10/20/2013   CLINICAL DATA:  Back pain  EXAM: CT ANGIOGRAPHY CHEST, ABDOMEN AND PELVIS  TECHNIQUE: Multidetector CT imaging through the chest, abdomen and pelvis was performed using the standard protocol during bolus administration of intravenous contrast. Multiplanar reconstructed images and MIPs were obtained and reviewed to evaluate the vascular anatomy.  CONTRAST:  77mL OMNIPAQUE IOHEXOL 350 MG/ML SOLN  COMPARISON:  09/22/2012  FINDINGS: CTA CHEST FINDINGS  No evidence of intramural hematoma, aortic dissection, or aortic transection. Motion artifact is noted in the ascending aorta. Maximal diameter of the ascending aorta is 3.3  cm.  No obvious filling defect in the pulmonary arterial tree to suggest acute pulmonary thromboembolism.  Great vessels are patent within the confines of the exam. Visualized vertebral arteries are patent.  No abnormal mediastinal adenopathy.  Clear lungs  No acute bony deformity.  Review of the MIP images confirms the above findings.  CTA ABDOMEN AND PELVIS FINDINGS  No evidence of aortic dissection or aneurysm. Minimal atherosclerotic calcifications of the abdominal aorta are noted.  Celiac is patent. Branch vessels are patent. Accessory left hepatic artery.  SMA is patent.  Branch vessels are patent.  IMA is patent and diminutive.  A single right renal artery and 2 left renal arteries are patent.  Bilateral common iliac, external iliac, and internal iliac arteries are patent.  Innumerable liver cysts are stable.  Gallbladder, spleen, adrenal glands are within normal limits.  Small hypodensities in the kidneys are not significantly changed allowing for differences in technique. There is one hyperdensity in the left kidney measuring 7 mm on image 147 of series 501 that is stable.  There is a 2.8 cine m a lobulated cystic abnormality in the tail  of the pancreas that is stable in size. There is a sub cm hypodensity in the uncinate process of the pancreas on image 163 which is probably not significantly changed. The pancreatic duct measures 5 mm in caliber which is mildly dilated. This is also stable.  Advanced degenerative change in the lower lumbar spine is not significantly changed. No vertebral compression deformity  Bladder, uterus, and adnexa are within normal limits.  No free-fluid.  No abnormal retroperitoneal adenopathy.  Review of the MIP images confirms the above findings.  IMPRESSION: No evidence of thoracic or abdominal aortic aneurysm or dissection. No pulmonary embolism. No intramural hematoma.  Innumerable cysts in the liver are stable. Hypodensities, likely cysts, in the kidneys are also not significantly changed.  There are at least 2 low-density abnormalities within the pancreas likely cystic lesions. There are not significantly changed dating back 1 year. The pancreatic duct is dilated which is also a stable finding. One year follow-up was recommended to ensure stability as stability over 2 years supports benign etiology. If there is strong concern for pancreatic malignancy, MRI of the pancreas can be performed to further delineate.   Electronically Signed   By: Maryclare Bean M.D.   On: 10/20/2013 14:38     EKG Interpretation   Date/Time:  Monday October 20 2013 13:40:45 EDT Ventricular Rate:  77 PR Interval:  180 QRS Duration: 98 QT Interval:  387 QTC Calculation: 438 R Axis:   25 Text Interpretation:  Sinus rhythm Abnormal R-wave progression, early  transition No significant change since last tracing Confirmed by WARD,   DO, KRISTEN ([id number]) on 10/20/2013 1:50:01 PM      MDM   Final diagnoses:  Right flank pain  Nausea  Hematuria  History of renal stone    Pt is a 63yo female sent to ED from UC for further evaluation for possible renal stone vs AAA.  Discussed pt with Dr. Leonides Schanz who also examined pt, low suspicion  for aortic disection as pt has mild abdominal pain and hemodynamically stable, however, due to concern for midline pulsatile mass, hx of uncontrolled HTN and pt's age, CT angio of chest and abdomen performed to r/o aortic dissection.     CT unremarkable. No findings to suggest cause pt's symptoms. Reviewed imaging from last year that indicated 2 renal stones.  Discussed with pt she may have  already passed the stones. Cr has improved from 1.17 two days ago, to 0.84 today.  Will get UA and send for culture to ensure no UTI/pyelonephrosis.  Will tx pt symptomatically for pain and nausea.   UA: hematuria present, otherwise, unremarkable.  Advised to f/u with PCP in 2-3 days for recheck of symptoms and urine. If hematuria still present, pt may need referral to urology.  Return precautions provided. Pt verbalized understanding and agreement with tx plan.     Noland Fordyce, PA-C 10/20/13 1624

## 2013-10-20 NOTE — ED Provider Notes (Signed)
Medical screening examination/treatment/procedure(s) were conducted as a shared visit with non-physician practitioner(s) and myself.  I personally evaluated the patient during the encounter.   EKG Interpretation   Date/Time:  Monday October 20 2013 13:40:45 EDT Ventricular Rate:  77 PR Interval:  180 QRS Duration: 98 QT Interval:  387 QTC Calculation: 438 R Axis:   25 Text Interpretation:  Sinus rhythm Abnormal R-wave progression, early  transition No significant change since last tracing Confirmed by Devlon Dosher,   DO, Kelby Adell ([id number]) on 10/20/2013 1:50:01 PM       Pt is a 63 y.o. F history of hypertension and possible history of prior kidney stones who presents emergency room with right flank pain weeks it is progressively worse. No chest pain. She does have diffuse abdominal pain. No numbness, tingling or focal weakness. Denies any dysuria or hematuria. She was seen in urgent care today and found to have trace leukocytes and moderate blood. Concern for nephrolithiasis versus AAA. On exam, patient appears uncomfortable. She is mildly hypertensive and tachycardic. She is diffusely tender to palpation throughout her abdomen and does have a pulsatile midline mass but she is thin. Equal pulses in all of her extremities. Neurologically intact. Labs unremarkable. CT scan shows no acute abnormality to explain her pain. Urine shows hematuria but no other sign of infection. No kidney stone noted on CT scan. We'll discharge home. This may be musculoskeletal pain. We'll have her followup with her PCP for her hematuria.  [place], DO 10/20/13 [phone]

## 2013-10-20 NOTE — ED Notes (Signed)
Per pt sts severe lower back pain. Denies any abdominal pain. St some urinary frequency. Sent here from [facility] to r/o kidney stone, and AAA.

## 2013-10-20 NOTE — ED Provider Notes (Signed)
CSN: [id number]     Arrival date & time 10/20/13  0913 History   First MD Initiated Contact with Patient 10/20/13 [phone]     Chief Complaint  Patient presents with  . Back Pain   (Consider location/radiation/quality/duration/timing/severity/associated sxs/prior Treatment) HPI   63 year old female with history of hypertension, one episode of kidney stones presents complaining of right flank pain, abdominal pain, urinary frequency. She has had some mild back pain for about a month but has gotten progressively worse. It got much worse acutely over the past 2 days. She rates her pain as 10-1008 in severity. It is nonradiating. No nausea or vomiting. No leg numbness or weakness. She has poorly controlled hypertension but no smoking history. She is taking her antihypertensives as prescribed. She denies chest pain or shortness of breath.  Of note, she was in the emergency department yesterday and did not mention any back pain.  Past Medical History  Diagnosis Date  . Hypertension   . Hypercholesteremia   . Atrial fibrillation   . DDD (degenerative disc disease), lumbar    History reviewed. No pertinent past surgical history. Family History  Problem Relation Age of Onset  . Cancer Mother   . Hypertension Mother   . Diabetes Father   . Hypertension Father    History  Substance Use Topics  . Smoking status: Never Smoker   . Smokeless tobacco: Never Used  . Alcohol Use: No   OB History   Grav Para Term Preterm Abortions TAB SAB Ect Mult Living   5 5 5       5      Review of Systems  Constitutional: Negative for fever and chills.  Respiratory: Negative for shortness of breath.   Cardiovascular: Negative for chest pain.  Genitourinary: Positive for dysuria, frequency and flank pain.  Musculoskeletal: Positive for back pain.  All other systems reviewed and are negative.   Allergies  Statins  Home Medications   Prior to Admission medications   Medication Sig Start Date End Date  Taking? Authorizing Provider  aspirin EC 81 MG tablet Take 81 mg by mouth daily.     Yes Historical Provider, MD  diltiazem (TIAZAC) 240 MG 24 hr capsule Take 240 mg by mouth daily.   Yes Historical Provider, MD  LORazepam (ATIVAN) 1 MG tablet Take 1 mg by mouth at bedtime.   Yes Historical Provider, MD  Multiple Vitamin (MULTIVITAMIN WITH MINERALS) TABS tablet Take 1 tablet by mouth daily.   Yes Historical Provider, MD  Omega-3 Fatty Acids (FISH OIL) 1000 MG CAPS Take 1 capsule by mouth daily.     Yes Historical Provider, MD  Tetrahydrozoline HCl (VISINE EXTRA OP) Apply 2 drops to eye daily as needed (Dry eyes).    Yes Historical Provider, MD   BP 156/102  Pulse 105  Temp(Src) 98.1 F (36.7 C) (Oral)  Resp 16  SpO2 99% Physical Exam  Nursing note and vitals reviewed. Constitutional: She is oriented to person, place, and time. Vital signs are normal. She appears well-developed and well-nourished. No distress.  HENT:  Head: Normocephalic and atraumatic.  Cardiovascular: Normal rate, regular rhythm and normal heart sounds.   Pulmonary/Chest: Effort normal and breath sounds normal. No respiratory distress.  Abdominal: Normal appearance. She exhibits pulsatile midline mass (minimal, pt is thin). There is no hepatosplenomegaly. There is tenderness in the epigastric area, periumbilical area and suprapubic area. There is CVA tenderness (right). There is no rigidity, no rebound, no guarding, no tenderness at McBurney's point and  negative Murphy's sign.  Neurological: She is alert and oriented to person, place, and time. She has normal strength. Coordination normal.  Skin: Skin is warm and dry. No rash noted. She is not diaphoretic.  Psychiatric: She has a normal mood and affect. Judgment normal.    ED Course  Procedures (including critical care time) Labs Review Labs Reviewed - No data to display  Imaging Review Dg Chest 2 View  10/19/2013   CLINICAL DATA:  Dizziness and headaches.  High  blood pressure.  EXAM: CHEST  2 VIEW  COMPARISON:  09/27/2013.  FINDINGS: The cardiac silhouette, mediastinal and hilar contours are normal and stable. There is mild tortuosity of the thoracic aorta. The lungs are clear. No pleural effusion. The bony thorax is intact.  IMPRESSION: No acute cardiopulmonary findings.   Electronically Signed   By: Kalman Jewels M.D.   On: 10/19/2013 00:00     MDM   1. Flank pain    Urinalysis shows trace leukocytes and moderate blood which would be consistent with nephrolithiasis. She has only had kidney stones once in the past. This would need to be confirmed. Also rule out AAA. Transferred to ED via shuttle.   Meds ordered this encounter  Medications  . ketorolac (TORADOL) injection 60 mg    Sig:       Liam Graham, PA-C 10/20/13 [phone]

## 2013-10-20 NOTE — ED Notes (Signed)
Pt reports right low and mid back pain the past month without known injury     It has gotten worse the last 2 days with urinary frequency.  She denies fever

## 2013-10-21 LAB — URINE CULTURE: Colony Count: 35000

## 2013-12-08 ENCOUNTER — Encounter (HOSPITAL_COMMUNITY): Payer: Self-pay | Admitting: Emergency Medicine

## 2014-01-03 ENCOUNTER — Encounter (HOSPITAL_COMMUNITY): Payer: Self-pay | Admitting: *Deleted

## 2014-01-03 ENCOUNTER — Emergency Department (HOSPITAL_COMMUNITY): Payer: Self-pay

## 2014-01-03 ENCOUNTER — Emergency Department (HOSPITAL_COMMUNITY)
Admission: EM | Admit: 2014-01-03 | Discharge: 2014-01-03 | Disposition: A | Discharge status: Home / Self Care | Payer: Self-pay | Attending: Emergency Medicine | Admitting: Emergency Medicine

## 2014-01-03 DIAGNOSIS — Z8639 Personal history of other endocrine, nutritional and metabolic disease: Secondary | ICD-10-CM | POA: Insufficient documentation

## 2014-01-03 DIAGNOSIS — Z79899 Other long term (current) drug therapy: Secondary | ICD-10-CM | POA: Insufficient documentation

## 2014-01-03 DIAGNOSIS — Z8739 Personal history of other diseases of the musculoskeletal system and connective tissue: Secondary | ICD-10-CM | POA: Insufficient documentation

## 2014-01-03 DIAGNOSIS — F419 Anxiety disorder, unspecified: Secondary | ICD-10-CM | POA: Insufficient documentation

## 2014-01-03 DIAGNOSIS — I10 Essential (primary) hypertension: Secondary | ICD-10-CM | POA: Insufficient documentation

## 2014-01-03 DIAGNOSIS — Z7982 Long term (current) use of aspirin: Secondary | ICD-10-CM | POA: Insufficient documentation

## 2014-01-03 DIAGNOSIS — R0789 Other chest pain: Secondary | ICD-10-CM | POA: Insufficient documentation

## 2014-01-03 DIAGNOSIS — R52 Pain, unspecified: Secondary | ICD-10-CM

## 2014-01-03 LAB — I-STAT CHEM 8, ED
BUN: 12 mg/dL (ref 6–23)
CREATININE: 0.9 mg/dL (ref 0.50–1.10)
Calcium, Ion: 1.24 mmol/L (ref 1.13–1.30)
Chloride: 101 mEq/L (ref 96–112)
GLUCOSE: 91 mg/dL (ref 70–99)
HEMATOCRIT: 38 % (ref 36.0–46.0)
Hemoglobin: 12.9 g/dL (ref 12.0–15.0)
Potassium: 3.4 mEq/L — ABNORMAL LOW (ref 3.7–5.3)
Sodium: 142 mEq/L (ref 137–147)
TCO2: 27 mmol/L (ref 0–100)

## 2014-01-03 LAB — I-STAT TROPONIN, ED: TROPONIN I, POC: 0.01 ng/mL (ref 0.00–0.08)

## 2014-01-03 LAB — D-DIMER, QUANTITATIVE: D-Dimer, Quant: 3.89 ug/mL-FEU — ABNORMAL HIGH (ref 0.00–0.48)

## 2014-01-03 MED ORDER — IOHEXOL 350 MG/ML SOLN
100.0000 mL | Freq: Once | INTRAVENOUS | Status: AC | PRN
  Administered 2014-01-03: 100 mL via INTRAVENOUS

## 2014-01-03 NOTE — ED Notes (Signed)
Pt c/o left rib pain that radiates to her back; pt states the pain has been going on for about 2 weeks

## 2014-01-03 NOTE — Discharge Instructions (Signed)
Use heat on the sore area 3 or 4 times a day. Take Tylenol every 4 hours as needed for pain.   Chest Wall Pain Chest wall pain is pain in or around the bones and muscles of your chest. It may take up to 6 weeks to get better. It may take longer if you must stay physically active in your work and activities.  CAUSES  Chest wall pain may happen on its own. However, it may be caused by:  A viral illness like the flu.  Injury.  Coughing.  Exercise.  Arthritis.  Fibromyalgia.  Shingles. HOME CARE INSTRUCTIONS   Avoid overtiring physical activity. Try not to strain or perform activities that cause pain. This includes any activities using your chest or your abdominal and side muscles, especially if heavy weights are used.  Put ice on the sore area.  Put ice in a plastic bag.  Place a towel between your skin and the bag.  Leave the ice on for 15-20 minutes per hour while awake for the first 2 days.  Only take over-the-counter or prescription medicines for pain, discomfort, or fever as directed by your caregiver. SEEK IMMEDIATE MEDICAL CARE IF:   Your pain increases, or you are very uncomfortable.  You have a fever.  Your chest pain becomes worse.  You have new, unexplained symptoms.  You have nausea or vomiting.  You feel sweaty or lightheaded.  You have a cough with phlegm (sputum), or you cough up blood. MAKE SURE YOU:   Understand these instructions.  Will watch your condition.  Will get help right away if you are not doing well or get worse. Document Released: 01/23/2005 Document Revised: 04/17/2011 Document Reviewed: 09/19/2010 [facility] Patient Information 2015 [place], [place]. This information is not intended to replace advice given to you by your health care provider. Make sure you discuss any questions you have with your health care provider.

## 2014-01-03 NOTE — ED Notes (Signed)
Pt alert & oriented x4, stable gait. Patient given discharge instructions, paperwork & prescription(s). Patient  instructed to stop at the registration desk to finish any additional paperwork. Patient verbalized understanding. Pt left department w/ no further questions. 

## 2014-01-03 NOTE — ED Notes (Addendum)
Pt reports left side rib cage pain that is worse when she touches. Pt states the pain moves around when she touches it. Pt denies any injury or accident.

## 2014-01-03 NOTE — ED Provider Notes (Signed)
CSN: [id number]     Arrival date & time 01/03/14  1906 History  This chart was scribed for Crystal Blade, MD by Randa Evens, ED Scribe. This patient was seen in room APA12/APA12 and the patient's care was started at 7:27 PM.    Chief Complaint  Patient presents with  . Chest Pain   The history is provided by the patient. No language interpreter was used.   HPI Comments: Crystal Cooke is a 63 y.o. female who presents to the Emergency Department complaining of bilateral rib pain onset 2 weeks ago. She states that the pain is worse on the left side. She describes the pain as stabbing and tender to palpation. She states that the pain is worse when eating. She states that she is having difficulty sleeping due to the pain being so uncomfortable. Denies any pain medications PTA.  She states that the pain is radiating into her back. Denies cough, SOB, fever, weakness or dizziness.     Past Medical History  Diagnosis Date  . Hypertension   . Hypercholesteremia   . Atrial fibrillation   . DDD (degenerative disc disease), lumbar    History reviewed. No pertinent past surgical history. Family History  Problem Relation Age of Onset  . Cancer Mother   . Hypertension Mother   . Diabetes Father   . Hypertension Father    History  Substance Use Topics  . Smoking status: Never Smoker   . Smokeless tobacco: Never Used  . Alcohol Use: No   OB History    Gravida Para Term Preterm AB TAB SAB Ectopic Multiple Living   5 5 5       5      Review of Systems  Constitutional: Negative for fever.  Respiratory: Negative for cough and shortness of breath.   Cardiovascular: Positive for chest pain (rib pain).  Musculoskeletal: Positive for back pain.  Neurological: Negative for dizziness and weakness.  All other systems reviewed and are negative.    Allergies  Statins  Home Medications   Prior to Admission medications   Medication Sig Start Date End Date Taking? Authorizing Provider   acetaminophen (TYLENOL) 500 MG tablet Take 1,000 mg by mouth every 6 (six) hours as needed for mild pain.   Yes Historical Provider, MD  aspirin EC 81 MG tablet Take 81 mg by mouth daily.     Yes Historical Provider, MD  diltiazem (TIAZAC) 240 MG 24 hr capsule Take 240 mg by mouth daily.   Yes Historical Provider, MD  Multiple Vitamin (MULTIVITAMIN WITH MINERALS) TABS tablet Take 1 tablet by mouth daily.   Yes Historical Provider, MD  Omega-3 Fatty Acids (FISH OIL) 1000 MG CAPS Take 1 capsule by mouth daily.     Yes Historical Provider, MD  Tetrahydrozoline HCl (VISINE EXTRA OP) Place 2 drops into both eyes daily as needed (Dry eyes).    Yes Historical Provider, MD  HYDROcodone-acetaminophen (NORCO/VICODIN) 5-325 MG per tablet Take 1-2 tablets by mouth every 6 (six) hours as needed for moderate pain or severe pain. Patient not taking: Reported on 01/03/2014 10/20/13   Noland Fordyce, PA-C  promethazine (PHENERGAN) 25 MG tablet Take 1 tablet (25 mg total) by mouth every 6 (six) hours as needed for nausea or vomiting. Patient not taking: Reported on 01/03/2014 10/20/13   Noland Fordyce, PA-C   Triage Vitals: BP 149/84 mmHg  Pulse 75  Temp(Src) 98 F (36.7 C) (Oral)  Resp 19  Ht 5\' 1"  (1.549 m)  Wt  115 lb (52.164 kg)  BMI 21.74 kg/m2  SpO2 100%  Physical Exam  Constitutional: She is oriented to person, place, and time. She appears well-developed and well-nourished.  HENT:  Head: Normocephalic and atraumatic.  Eyes: Conjunctivae and EOM are normal. Pupils are equal, round, and reactive to light.  Neck: Normal range of motion and phonation normal. Neck supple.  Cardiovascular: Normal rate and regular rhythm.   Pulmonary/Chest: Effort normal and breath sounds normal. She exhibits tenderness.  Diffuse chest wall tenderness to palpation.   Abdominal: Soft. She exhibits no distension. There is no tenderness. There is no guarding.  Musculoskeletal: Normal range of motion.  Neurological: She is  alert and oriented to person, place, and time. She exhibits normal muscle tone.  Skin: Skin is warm and dry.  Psychiatric: Her behavior is normal. Judgment and thought content normal. Her mood appears anxious.  Nursing note and vitals reviewed.   ED Course  Procedures (including critical care time) DIAGNOSTIC STUDIES: Oxygen Saturation is 100% on RA, normal by my interpretation.    COORDINATION OF CARE: 7:49 PM-Discussed treatment plan with pt at bedside and pt agreed to plan.   Medications  iohexol (OMNIPAQUE) 350 MG/ML injection 100 mL (100 mLs Intravenous Contrast Given 01/03/14 2154)    Patient Vitals for the past 24 hrs:  BP Temp Temp src Pulse Resp SpO2 Height Weight  01/03/14 1915 149/84 mmHg 98 F (36.7 C) Oral 75 19 100 % 5\' 1"  (1.549 m) 115 lb (52.164 kg)    10:40 PM Reevaluation with update and discussion. After initial assessment and treatment, an updated evaluation reveals no additional complaints.  She is comfortable.  Findings discussed with patient, all questions answered.[name]   Labs Review Labs Reviewed  D-DIMER, QUANTITATIVE - Abnormal; Notable for the following:    D-Dimer, Quant 3.89 (*)    All other components within normal limits  I-STAT CHEM 8, ED - Abnormal; Notable for the following:    Potassium 3.4 (*)    All other components within normal limits  I-STAT TROPOININ, ED    Imaging Review Dg Chest 2 View  01/03/2014   CLINICAL DATA:  Chest pain.  EXAM: CHEST  2 VIEW  COMPARISON:  October 18, 2013.  FINDINGS: The heart size and mediastinal contours are within normal limits. Both lungs are clear. No pneumothorax or pleural effusion is noted. The visualized skeletal structures are unremarkable.  IMPRESSION: No acute cardiopulmonary abnormality seen.   Electronically Signed   By: Sabino Dick M.D.   On: 01/03/2014 21:12   Ct Angio Chest Pe W/cm &/or Wo Cm  01/03/2014   CLINICAL DATA:  63 year old female with 2 week history of left chest  pain  EXAM: CT ANGIOGRAPHY CHEST WITH CONTRAST  TECHNIQUE: Multidetector CT imaging of the chest was performed using the standard protocol during bolus administration of intravenous contrast. Multiplanar CT image reconstructions and MIPs were obtained to evaluate the vascular anatomy.  CONTRAST:  155mL OMNIPAQUE IOHEXOL 350 MG/ML SOLN  COMPARISON:  Chest x-ray obtained earlier today ; most recent prior chest CT 10/20/2013  FINDINGS: Mediastinum: Unremarkable CT appearance of the thyroid gland. No suspicious mediastinal or hilar adenopathy. No soft tissue mediastinal mass. The thoracic esophagus is unremarkable.  Heart/Vascular: Adequate opacification of the pulmonary arteries to the proximal subsegmental level. Evaluation beyond the segmental level is limited by respiratory motion. No evidence of central filling defect to suggest acute pulmonary embolus. The heart is within normal limits for size. No pericardial effusion. Conventional  3 vessel aortic arch anatomy. No aneurysmal dilatation or evidence of dissection.  Lungs/Pleura: Mild dependent atelectasis in the lower lungs. Otherwise, the lungs are clear.  Bones/Soft Tissues: No acute fracture or aggressive appearing lytic or blastic osseous lesion.  Upper Abdomen: Similar appearance of the numerous hepatic cysts including a very large 9 cm cyst in the right hepatic lobe. Otherwise, the visualized upper abdomen is unremarkable.  Review of the MIP images confirms the above findings.  IMPRESSION: Negative for acute pulmonary embolus, pneumonia or other acute cardiopulmonary process.   Electronically Signed   By: Jacqulynn Cadet M.D.   On: 01/03/2014 22:15     EKG Interpretation   Date/Time:  Saturday January 03 2014 20:19:37 EST Ventricular Rate:  66 PR Interval:  183 QRS Duration: 77 QT Interval:  411 QTC Calculation: 431 R Axis:   50 Text Interpretation:  Sinus rhythm Left atrial enlargement Borderline T  abnormalities, anterior leads Since last  tracing anterior T wave  abnormality is new Confirmed by Vaani Morren  MD, Briahnna Harries ([id number]) on 01/03/2014  8:27:07 PM      MDM   Final diagnoses:  Pain  Chest wall pain   Nonspecific chest wall pain without evidence for PE, pneumonia, ACS or serious bacterial infection.  Nursing Notes Reviewed/ Care Coordinated Applicable Imaging Reviewed Interpretation of Laboratory Data incorporated into ED treatment  The patient appears reasonably screened and/or stabilized for discharge and I doubt any other medical condition or other [facility] requiring further screening, evaluation, or treatment in the ED at this time prior to discharge.  Plan: Home Medications- Tylenol; Home Treatments- Heat; return here if the recommended treatment, does not improve the symptoms; Recommended follow up- PCP prn    I personally performed the services described in this documentation, which was scribed in my presence. The recorded information has been reviewed and is accurate.       Crystal Blade, MD 01/03/14 [phone]

## 2014-04-11 DIAGNOSIS — R1084 Generalized abdominal pain: Secondary | ICD-10-CM | POA: Diagnosis not present

## 2014-04-11 DIAGNOSIS — R112 Nausea with vomiting, unspecified: Secondary | ICD-10-CM | POA: Diagnosis not present

## 2014-04-11 DIAGNOSIS — R1013 Epigastric pain: Secondary | ICD-10-CM | POA: Diagnosis not present

## 2014-04-11 DIAGNOSIS — K7689 Other specified diseases of liver: Secondary | ICD-10-CM | POA: Diagnosis not present

## 2014-04-11 DIAGNOSIS — I10 Essential (primary) hypertension: Secondary | ICD-10-CM | POA: Diagnosis not present

## 2014-04-11 DIAGNOSIS — N390 Urinary tract infection, site not specified: Secondary | ICD-10-CM | POA: Diagnosis not present

## 2014-04-11 DIAGNOSIS — R109 Unspecified abdominal pain: Secondary | ICD-10-CM | POA: Diagnosis not present

## 2014-04-11 DIAGNOSIS — Z7982 Long term (current) use of aspirin: Secondary | ICD-10-CM | POA: Diagnosis not present

## 2014-04-11 DIAGNOSIS — K5900 Constipation, unspecified: Secondary | ICD-10-CM | POA: Diagnosis not present

## 2014-04-11 DIAGNOSIS — Z79899 Other long term (current) drug therapy: Secondary | ICD-10-CM | POA: Diagnosis not present

## 2014-04-11 DIAGNOSIS — N281 Cyst of kidney, acquired: Secondary | ICD-10-CM | POA: Diagnosis not present

## 2014-04-15 DIAGNOSIS — K279 Peptic ulcer, site unspecified, unspecified as acute or chronic, without hemorrhage or perforation: Secondary | ICD-10-CM | POA: Diagnosis not present

## 2014-04-15 DIAGNOSIS — N390 Urinary tract infection, site not specified: Secondary | ICD-10-CM | POA: Diagnosis not present

## 2014-04-15 DIAGNOSIS — I10 Essential (primary) hypertension: Secondary | ICD-10-CM | POA: Diagnosis not present

## 2014-04-15 DIAGNOSIS — R109 Unspecified abdominal pain: Secondary | ICD-10-CM | POA: Diagnosis not present

## 2014-04-28 DIAGNOSIS — R1084 Generalized abdominal pain: Secondary | ICD-10-CM | POA: Diagnosis not present

## 2014-04-28 DIAGNOSIS — R634 Abnormal weight loss: Secondary | ICD-10-CM | POA: Diagnosis not present

## 2014-04-29 DIAGNOSIS — R109 Unspecified abdominal pain: Secondary | ICD-10-CM | POA: Diagnosis not present

## 2014-04-29 DIAGNOSIS — I10 Essential (primary) hypertension: Secondary | ICD-10-CM | POA: Diagnosis not present

## 2014-04-29 DIAGNOSIS — M25521 Pain in right elbow: Secondary | ICD-10-CM | POA: Diagnosis not present

## 2014-05-05 DIAGNOSIS — R109 Unspecified abdominal pain: Secondary | ICD-10-CM | POA: Diagnosis not present

## 2014-05-05 DIAGNOSIS — I10 Essential (primary) hypertension: Secondary | ICD-10-CM | POA: Diagnosis not present

## 2014-05-05 DIAGNOSIS — Z801 Family history of malignant neoplasm of trachea, bronchus and lung: Secondary | ICD-10-CM | POA: Diagnosis not present

## 2014-05-05 DIAGNOSIS — Z8249 Family history of ischemic heart disease and other diseases of the circulatory system: Secondary | ICD-10-CM | POA: Diagnosis not present

## 2014-05-05 DIAGNOSIS — Z888 Allergy status to other drugs, medicaments and biological substances status: Secondary | ICD-10-CM | POA: Diagnosis not present

## 2014-05-05 DIAGNOSIS — R1010 Upper abdominal pain, unspecified: Secondary | ICD-10-CM | POA: Diagnosis not present

## 2014-05-05 DIAGNOSIS — R1084 Generalized abdominal pain: Secondary | ICD-10-CM | POA: Diagnosis not present

## 2014-05-05 DIAGNOSIS — Z833 Family history of diabetes mellitus: Secondary | ICD-10-CM | POA: Diagnosis not present

## 2014-05-05 DIAGNOSIS — R634 Abnormal weight loss: Secondary | ICD-10-CM | POA: Diagnosis not present

## 2014-05-05 DIAGNOSIS — Z8711 Personal history of peptic ulcer disease: Secondary | ICD-10-CM | POA: Diagnosis not present

## 2014-05-05 DIAGNOSIS — R1013 Epigastric pain: Secondary | ICD-10-CM | POA: Diagnosis not present

## 2014-05-05 DIAGNOSIS — Z823 Family history of stroke: Secondary | ICD-10-CM | POA: Diagnosis not present

## 2014-05-05 DIAGNOSIS — Z800 Family history of malignant neoplasm of digestive organs: Secondary | ICD-10-CM | POA: Diagnosis not present

## 2014-05-05 DIAGNOSIS — Z79899 Other long term (current) drug therapy: Secondary | ICD-10-CM | POA: Diagnosis not present

## 2014-05-05 HISTORY — PX: COLONOSCOPY: SHX174

## 2014-05-05 HISTORY — PX: ESOPHAGOGASTRODUODENOSCOPY: SHX1529

## 2014-06-03 DIAGNOSIS — I10 Essential (primary) hypertension: Secondary | ICD-10-CM | POA: Diagnosis not present

## 2014-06-03 DIAGNOSIS — Z87440 Personal history of urinary (tract) infections: Secondary | ICD-10-CM | POA: Diagnosis not present

## 2014-06-24 DIAGNOSIS — I10 Essential (primary) hypertension: Secondary | ICD-10-CM | POA: Diagnosis not present

## 2014-06-24 DIAGNOSIS — Z1239 Encounter for other screening for malignant neoplasm of breast: Secondary | ICD-10-CM | POA: Diagnosis not present

## 2014-07-02 DIAGNOSIS — Z1231 Encounter for screening mammogram for malignant neoplasm of breast: Secondary | ICD-10-CM | POA: Diagnosis not present

## 2014-07-02 DIAGNOSIS — R928 Other abnormal and inconclusive findings on diagnostic imaging of breast: Secondary | ICD-10-CM | POA: Diagnosis not present

## 2014-07-14 DIAGNOSIS — Z801 Family history of malignant neoplasm of trachea, bronchus and lung: Secondary | ICD-10-CM | POA: Diagnosis not present

## 2014-07-14 DIAGNOSIS — Z79899 Other long term (current) drug therapy: Secondary | ICD-10-CM | POA: Diagnosis not present

## 2014-07-14 DIAGNOSIS — Z833 Family history of diabetes mellitus: Secondary | ICD-10-CM | POA: Diagnosis not present

## 2014-07-14 DIAGNOSIS — I10 Essential (primary) hypertension: Secondary | ICD-10-CM | POA: Diagnosis not present

## 2014-07-14 DIAGNOSIS — Z8711 Personal history of peptic ulcer disease: Secondary | ICD-10-CM | POA: Diagnosis not present

## 2014-07-14 DIAGNOSIS — Z823 Family history of stroke: Secondary | ICD-10-CM | POA: Diagnosis not present

## 2014-07-14 DIAGNOSIS — Z800 Family history of malignant neoplasm of digestive organs: Secondary | ICD-10-CM | POA: Diagnosis not present

## 2014-07-14 DIAGNOSIS — Z888 Allergy status to other drugs, medicaments and biological substances status: Secondary | ICD-10-CM | POA: Diagnosis not present

## 2014-07-14 DIAGNOSIS — Z8249 Family history of ischemic heart disease and other diseases of the circulatory system: Secondary | ICD-10-CM | POA: Diagnosis not present

## 2014-07-14 DIAGNOSIS — Z1211 Encounter for screening for malignant neoplasm of colon: Secondary | ICD-10-CM | POA: Diagnosis not present

## 2014-07-15 DIAGNOSIS — R922 Inconclusive mammogram: Secondary | ICD-10-CM | POA: Diagnosis not present

## 2014-07-15 DIAGNOSIS — R928 Other abnormal and inconclusive findings on diagnostic imaging of breast: Secondary | ICD-10-CM | POA: Diagnosis not present

## 2014-08-10 DIAGNOSIS — K869 Disease of pancreas, unspecified: Secondary | ICD-10-CM | POA: Diagnosis not present

## 2014-08-10 DIAGNOSIS — K7689 Other specified diseases of liver: Secondary | ICD-10-CM | POA: Diagnosis not present

## 2014-08-10 DIAGNOSIS — K2970 Gastritis, unspecified, without bleeding: Secondary | ICD-10-CM | POA: Diagnosis not present

## 2014-08-10 DIAGNOSIS — Q446 Cystic disease of liver: Secondary | ICD-10-CM | POA: Diagnosis not present

## 2014-08-10 DIAGNOSIS — R1084 Generalized abdominal pain: Secondary | ICD-10-CM | POA: Diagnosis not present

## 2014-08-10 DIAGNOSIS — Z79899 Other long term (current) drug therapy: Secondary | ICD-10-CM | POA: Diagnosis not present

## 2014-08-10 DIAGNOSIS — K862 Cyst of pancreas: Secondary | ICD-10-CM | POA: Diagnosis not present

## 2014-08-10 DIAGNOSIS — R0789 Other chest pain: Secondary | ICD-10-CM | POA: Diagnosis not present

## 2014-08-10 DIAGNOSIS — K2900 Acute gastritis without bleeding: Secondary | ICD-10-CM | POA: Diagnosis not present

## 2014-08-10 DIAGNOSIS — I10 Essential (primary) hypertension: Secondary | ICD-10-CM | POA: Diagnosis not present

## 2014-08-11 DIAGNOSIS — K2970 Gastritis, unspecified, without bleeding: Secondary | ICD-10-CM | POA: Diagnosis not present

## 2014-08-11 DIAGNOSIS — I10 Essential (primary) hypertension: Secondary | ICD-10-CM | POA: Diagnosis not present

## 2014-08-11 DIAGNOSIS — R109 Unspecified abdominal pain: Secondary | ICD-10-CM | POA: Diagnosis not present

## 2014-08-12 ENCOUNTER — Encounter: Payer: Self-pay | Admitting: Internal Medicine

## 2014-08-26 DIAGNOSIS — I10 Essential (primary) hypertension: Secondary | ICD-10-CM | POA: Diagnosis not present

## 2014-08-26 DIAGNOSIS — F419 Anxiety disorder, unspecified: Secondary | ICD-10-CM | POA: Diagnosis not present

## 2014-08-26 DIAGNOSIS — Z79899 Other long term (current) drug therapy: Secondary | ICD-10-CM | POA: Diagnosis not present

## 2014-08-26 DIAGNOSIS — R531 Weakness: Secondary | ICD-10-CM | POA: Diagnosis not present

## 2014-08-26 DIAGNOSIS — M549 Dorsalgia, unspecified: Secondary | ICD-10-CM | POA: Diagnosis not present

## 2014-08-26 DIAGNOSIS — R1084 Generalized abdominal pain: Secondary | ICD-10-CM | POA: Diagnosis not present

## 2014-08-28 ENCOUNTER — Ambulatory Visit (INDEPENDENT_AMBULATORY_CARE_PROVIDER_SITE_OTHER): Payer: Medicare Other | Admitting: Gastroenterology

## 2014-08-28 ENCOUNTER — Encounter: Payer: Self-pay | Admitting: Gastroenterology

## 2014-08-28 VITALS — BP 134/84 | HR 96 | Temp 98.3°F | Ht 62.0 in | Wt 115.4 lb

## 2014-08-28 DIAGNOSIS — K7689 Other specified diseases of liver: Secondary | ICD-10-CM | POA: Diagnosis not present

## 2014-08-28 DIAGNOSIS — K862 Cyst of pancreas: Secondary | ICD-10-CM | POA: Diagnosis not present

## 2014-08-28 DIAGNOSIS — R634 Abnormal weight loss: Secondary | ICD-10-CM | POA: Insufficient documentation

## 2014-08-28 DIAGNOSIS — R1013 Epigastric pain: Secondary | ICD-10-CM | POA: Insufficient documentation

## 2014-08-28 MED ORDER — HYDROCODONE-ACETAMINOPHEN 5-325 MG PO TABS: 1.0000 | ORAL_TABLET | ORAL | Status: DC | PRN

## 2014-08-28 NOTE — Progress Notes (Signed)
Primary Care Physician:  Wendee Beavers, NP  Primary Gastroenterologist:  Garfield Cornea, MD   Chief Complaint  Patient presents with  . Abdominal Pain    HPI:  Crystal Cooke is a 64 y.o. female here for further evaluation of pancreatic cyst and abdominal pain. Patient complains of progressive abdominal pain especially over the last month but has been present over several months. Notes pain throughout her abdomen more in the left upper quadrant and radiating into her upper back. Pain is associated with abdominal bloating. Denies nausea or vomiting. Worse with meals. No improvement with PPI. Reports 10 pound weight loss over the past several months. Her appetite is poor. Generally has a bowel movement every 3 days, uses Dulcolax as needed. Denies blood in the stool or melena. Denies NSAID use. No heartburn, dysphagia.  Earlier this year patient states she was hospitalized with abdominal pain. Describes undergoing an upper endoscopy and colonoscopy with Dr. Britta Mccreedy with normal findings. We have requested records but have not received a time of this dictation. Patient states she could not get back in to see Dr. Britta Mccreedy in a timely fashion therefore PCP referred her to [place].  Patient recently had severe abdominal pain in which the emergency department on 08/10/2014 at [facility]. She underwent a CT of the abdomen pelvis with contrast which showed numerous hepatic cyst, largest measuring 10 cm in the inferior right liver. No biliary obstruction or stone. Previously seen main pancreatic duct enlargement had resolved. Interval enlargement of macroscopic cystic lesion in the tail the pancreas, currently 34 mm in diameter, previously was 27 mm back in September 2015, 21 mm back in 05-29-12. A subcentimeter cyst again noted in the pancreas uncinate region. Possible 11 mm cyst in the left ovary needs reevaluation at some point.     Current Outpatient Prescriptions  Medication Sig Dispense Refill  .  acetaminophen (TYLENOL) 500 MG tablet Take 1,000 mg by mouth every 6 (six) hours as needed for mild pain.    Marland Kitchen ALPRAZolam (XANAX) 0.5 MG tablet 0.5 mg 2 (two) times daily as needed.    Marland Kitchen CARTIA XT 300 MG 24 hr capsule 300 mg daily.    . Multiple Vitamin (MULTIVITAMIN WITH MINERALS) TABS tablet Take 1 tablet by mouth daily.    . Omega-3 Fatty Acids (FISH OIL) 1000 MG CAPS Take 1 capsule by mouth daily.      .        No current facility-administered medications for this visit.    Allergies as of 08/28/2014 - Review Complete 08/28/2014  Allergen Reaction Noted  . Statins Swelling and Rash 08/31/2013    Past Medical History  Diagnosis Date  . Hypertension   . Hypercholesteremia   . Atrial fibrillation     ??? has never seen cardiologist per patient  . DDD (degenerative disc disease), lumbar   . Anxiety     Past Surgical History  Procedure Laterality Date  . None      Family History  Problem Relation Age of Onset  . Lung cancer Mother        . Hypertension Mother   . Diabetes Father   . Hypertension Father   . Lung cancer Father     deceased age 34  . Colon cancer Sister     deceased May 30, 2014, age 31  . Pancreatic cancer Neg Hx     History   Social History  . Marital Status: Widowed    Spouse Name: N/A  . Number of  Children: N/A  . Years of Education: N/A   Occupational History  . Not on file.   Social History Main Topics  . Smoking status: Never Smoker   . Smokeless tobacco: Never Used  . Alcohol Use: No  . Drug Use: No  . Sexual Activity: Not on file   Other Topics Concern  . Not on file   Social History Narrative      ROS:  General: see hpi. +fatigue Eyes: Negative for vision changes.  ENT: Negative for hoarseness, difficulty swallowing , nasal congestion. CV: Negative for chest pain, angina, palpitations, dyspnea on exertion, peripheral edema.  Respiratory: Negative for dyspnea at rest, dyspnea on exertion, cough, sputum, wheezing.  GI: See  history of present illness. GU:  Negative for dysuria, hematuria, urinary incontinence, urinary frequency, nocturnal urination.  MS: Negative for joint pain, low back pain.  Derm: Negative for rash or itching.  Neuro: Negative for weakness, abnormal sensation, seizure, frequent headaches, memory loss, confusion.  Psych: Negative for anxiety, depression, suicidal ideation, hallucinations.  Endo:see hpi Heme: Negative for bruising or bleeding. Allergy: Negative for rash or hives.    Physical Examination:  BP 134/84 mmHg  Pulse 96  Temp(Src) 98.3 F (36.8 C) (Oral)  Ht 5\' 2"  (1.575 m)  Wt 115 lb 6.4 oz (52.345 kg)  BMI 21.10 kg/m2   General: Well-nourished, well-developed in no acute distress.  Head: Normocephalic, atraumatic.   Eyes: Conjunctiva pink, no icterus. Mouth: Oropharyngeal mucosa moist and pink , no lesions erythema or exudate. Neck: Supple without thyromegaly, masses, or lymphadenopathy.  Lungs: Clear to auscultation bilaterally.  Heart: Regular rate and rhythm, no murmurs rubs or gallops.  Abdomen: Bowel sounds are normal, mild epigastric/luq tenderness, nondistended, no hepatosplenomegaly or masses, no abdominal bruits or    hernia , no rebound or guarding.   Rectal: not performed Extremities: No lower extremity edema. No clubbing or deformities.  Neuro: Alert and oriented x 4 , grossly normal neurologically.  Skin: Warm and dry, no rash or jaundice.   Psych: Alert and cooperative, normal mood and affect.  Labs: Labs dated 08/10/2014 Glucose 103, BUN 16, creatinine 0.72, calcium 9.7, albumen 4.2, alkaline phosphatase 112 high, AST 18.7, ALT 18, total bilirubin 0.7, lipase 19, white blood cell count 14,500, hemoglobin 13.1, platelets 207,000  08/26/2014 White blood cell count 7600, hemoglobin 12.8, hematocrit 38.7, platelets 203,000, potassium 3.2, sodium 141, lipase 17, amylase 1:15, calcium 10.3, albumen 4.79, ALT 15 AST 22.4, total bilirubin 0.9  Imaging  Studies: Chest x-ray on 08/10/2014, no active disease  CT abdomen pelvis with contrast on 08/10/2014  Comparison 10/20/2013  Numerous hepatic cysts, 10 cm cyst in the inferior right liver, no biliary obstruction or stone, previously seen Main duct enlargement has resolved (pancreas), interval enlargement of a macroscopic cystic lesion in the tail of the pancreas currently 34 mm in diameter, previously 27 mm. Seen in 2014, 21 mm at that time. Subcentimeter cyst again noted in the pancreas use and it region. Possible 11 mm cyst left ovary, reevaluate at time of follow-up.`    10/20/2013 CLINICAL DATA: Back pain  EXAM: CT ANGIOGRAPHY CHEST, ABDOMEN AND PELVIS  TECHNIQUE: Multidetector CT imaging through the chest, abdomen and pelvis was performed using the standard protocol during bolus administration of intravenous contrast. Multiplanar reconstructed images and MIPs were obtained and reviewed to evaluate the vascular anatomy.  CONTRAST: 70mL OMNIPAQUE IOHEXOL 350 MG/ML SOLN  COMPARISON: 09/22/2012  FINDINGS: CTA CHEST FINDINGS  No evidence of intramural hematoma, aortic dissection,  or aortic transection. Motion artifact is noted in the ascending aorta. Maximal diameter of the ascending aorta is 3.3 cm.  No obvious filling defect in the pulmonary arterial tree to suggest acute pulmonary thromboembolism.  Great vessels are patent within the confines of the exam. Visualized vertebral arteries are patent.  No abnormal mediastinal adenopathy.  Clear lungs  No acute bony deformity.  Review of the MIP images confirms the above findings.  CTA ABDOMEN AND PELVIS FINDINGS  No evidence of aortic dissection or aneurysm. Minimal atherosclerotic calcifications of the abdominal aorta are noted.  Celiac is patent. Branch vessels are patent. Accessory left hepatic artery.  SMA is patent. Branch vessels are patent.  IMA is patent and diminutive.  A single  right renal artery and 2 left renal arteries are patent.  Bilateral common iliac, external iliac, and internal iliac arteries are patent.  Innumerable liver cysts are stable.  Gallbladder, spleen, adrenal glands are within normal limits.  Small hypodensities in the kidneys are not significantly changed allowing for differences in technique. There is one hyperdensity in the left kidney measuring 7 mm on image 147 of series 501 that is stable.  There is a 2.8 cine m a lobulated cystic abnormality in the tail of the pancreas that is stable in size. There is a sub cm hypodensity in the uncinate process of the pancreas on image 163 which is probably not significantly changed. The pancreatic duct measures 5 mm in caliber which is mildly dilated. This is also stable.  Advanced degenerative change in the lower lumbar spine is not significantly changed. No vertebral compression deformity  Bladder, uterus, and adnexa are within normal limits.  No free-fluid. No abnormal retroperitoneal adenopathy.  Review of the MIP images confirms the above findings.  IMPRESSION: No evidence of thoracic or abdominal aortic aneurysm or dissection. No pulmonary embolism. No intramural hematoma.  Innumerable cysts in the liver are stable. Hypodensities, likely cysts, in the kidneys are also not significantly changed.  There are at least 2 low-density abnormalities within the pancreas likely cystic lesions. There are not significantly changed dating back 1 year. The pancreatic duct is dilated which is also a stable finding. One year follow-up was recommended to ensure stability as stability over 2 years supports benign etiology. If there is strong concern for pancreatic malignancy, MRI of the pancreas can be performed to further delineate.   Electronically Signed  By: Maryclare Bean M.D.  On: 10/20/2013 14:38

## 2014-08-28 NOTE — Assessment & Plan Note (Addendum)
64 year old female who presents for further evaluation of epigastric pain associated with anorexia, weight loss. Noted to have increasing cystic lesion in the tail of pancreas since 2014, subcentimeter cyst noted in the pancreas uncinate region. Multiple hepatic cysts, largest 10 cm in the inferior right liver. Family history significant for multiple malignancies in first-degree relatives. No history of pancreatic disease, personal history of pancreatitis. Further evaluation needed potentially via endoscopic ultrasound is a next maneuver. Cannot rule out mucinous cystadenoma, intraductal papillary mucinous neoplasm, or other malignancy.  Unclear how much of her discomfort is related to large hepatic cyst.   Prior EGD/colonoscopy reports requested from Dr. Britta Mccreedy.

## 2014-08-28 NOTE — Assessment & Plan Note (Signed)
Multiple hepatic cysts, largest 10cm inferior right hepatic lobe. Potentially contributing to some fullness/discomfort.

## 2014-08-28 NOTE — Patient Instructions (Signed)
1. I have requested all records from [facility] and Dr. Britta Mccreedy for review. 2. We will coordinate scheduling an endoscopic ultrasound with Dr. Ardis Hughs to evaluate pancreas as discussed. 3. RX for hydrocodone to use as needed for pain. Please do not take with other sources of generic Tylenol/acetaminophen as this pain medication also has Tylenol/acetaminophen in it.

## 2014-08-31 ENCOUNTER — Telehealth: Payer: Self-pay | Admitting: Gastroenterology

## 2014-08-31 ENCOUNTER — Telehealth: Payer: Self-pay

## 2014-08-31 NOTE — Telephone Encounter (Signed)
Please let patient know that Dr. Eugenia Pancoast office will be contacting her to set up the EUS to evaluate pancreatic cystic lesion (we discussed this at time of her office visit).

## 2014-08-31 NOTE — Telephone Encounter (Signed)
Pt is aware.  

## 2014-08-31 NOTE — Telephone Encounter (Signed)
-----   Message from Mahala Menghini, PA-C sent at 08/31/2014  8:38 AM EDT ----- Patient is aware of the need for EUS and anxious to get it scheduled. Thank you!  ----- Message -----    From: Milus Banister, MD    Sent: 08/29/2014   6:38 PM      To: Mahala Menghini, PA-C  I reviewed her images.  Given her abd pain, weight loss, increasing size of cyst I think EUS with FNA to sample the cyst fluid is a good next step.  Please let me know if she agrees to this plan and then we'll contact her to schedule.  Thanks   dj   ----- Message -----    From: Mahala Menghini, PA-C    Sent: 08/28/2014   2:11 PM      To: Milus Banister, MD, Mahala Menghini, PA-C  Hi Dr. Ardis Hughs! This patient is a potential EUS candidate. She is 64 y/o female with progressive upper abdominal pain, anorexia, weight loss. Recent CT imaging at [facility] (viewable through PACS) showed increasing cystic lesion in the tail the pancreas, 34 mm currently, 27 mm in September 2015, 21 mm in 2014. Subcentimeter cyst in the pancreas and uncinate region, large inferior right hepatic lobe cyst 10 cm. Initially reported on a CTA abdomen September 2015 apparently done to the emergency department and patient was unaware of finding. On retrospect apparently was seen on the noncontrast 2014 CT as well although not reported at the time.  Wanted to get your opinion regarding possible EUS at this point as opposed to further imaging via MRI.  Appreciate your help!  Laureen Ochs. Bernarda Caffey [facility] [phone] 7/22/20162:16 PM

## 2014-08-31 NOTE — Telephone Encounter (Signed)
See other phone note

## 2014-08-31 NOTE — Telephone Encounter (Signed)
Rayburn Felt get this scheduled.  thanks  Patty, See below. He needs upper EUS, radial +/- linear, ++MAC, next available EUS Thursday for pancreatic cyst, weight loss.  Thanks

## 2014-08-31 NOTE — Telephone Encounter (Signed)
PATIENT CALLED INQUIRING ABOUT HER APPOINTMENT FOR A DOCTOR IN [place].

## 2014-09-02 ENCOUNTER — Other Ambulatory Visit: Payer: Self-pay

## 2014-09-02 ENCOUNTER — Ambulatory Visit: Payer: Self-pay | Admitting: Gastroenterology

## 2014-09-02 DIAGNOSIS — K862 Cyst of pancreas: Secondary | ICD-10-CM

## 2014-09-02 DIAGNOSIS — R634 Abnormal weight loss: Secondary | ICD-10-CM

## 2014-09-02 NOTE — Telephone Encounter (Signed)
EUS scheduled, pt instructed and medications reviewed.  Patient instructions mailed to home.  Patient to call with any questions or concerns.  

## 2014-09-08 NOTE — Progress Notes (Signed)
CC'ED TO PCP 

## 2014-09-17 DIAGNOSIS — I10 Essential (primary) hypertension: Secondary | ICD-10-CM | POA: Diagnosis not present

## 2014-09-17 DIAGNOSIS — Z79899 Other long term (current) drug therapy: Secondary | ICD-10-CM | POA: Diagnosis not present

## 2014-09-17 DIAGNOSIS — N390 Urinary tract infection, site not specified: Secondary | ICD-10-CM | POA: Diagnosis not present

## 2014-09-17 DIAGNOSIS — E876 Hypokalemia: Secondary | ICD-10-CM | POA: Diagnosis not present

## 2014-09-21 ENCOUNTER — Encounter (HOSPITAL_COMMUNITY): Payer: Self-pay | Admitting: *Deleted

## 2014-09-23 NOTE — Anesthesia Preprocedure Evaluation (Addendum)
Anesthesia Evaluation  Patient identified by MRN, date of birth, ID band Patient awake    Reviewed: Allergy & Precautions, NPO status , Patient's Chart, lab work & pertinent test results  Airway Mallampati: II  TM Distance: >3 FB Neck ROM: Full    Dental  (+) Partial Upper, Partial Lower   Pulmonary neg pulmonary ROS,  breath sounds clear to auscultation  Pulmonary exam normal       Cardiovascular hypertension, Pt. on medications Normal cardiovascular exam+ dysrhythmias Atrial Fibrillation Rhythm:Regular Rate:Normal  Question of A-fib, has never seen a cardiologist per patient. Review of old EKGs show NSR with LAE.   Neuro/Psych PSYCHIATRIC DISORDERS Anxiety negative neurological ROS     GI/Hepatic Neg liver ROS, Pancreatic cyst   Endo/Other  negative endocrine ROS  Renal/GU negative Renal ROS     Musculoskeletal  (+) Arthritis -, Osteoarthritis,    Abdominal   Peds  Hematology negative hematology ROS (+)   Anesthesia Other Findings Day of surgery medications reviewed with the patient.  Reproductive/Obstetrics                           Anesthesia Physical Anesthesia Plan  ASA: II  Anesthesia Plan: MAC   Post-op Pain Management:    Induction: Intravenous  Airway Management Planned: Nasal Cannula  Additional Equipment:   Intra-op Plan:   Post-operative Plan:   Informed Consent: I have reviewed the patients History and Physical, chart, labs and discussed the procedure including the risks, benefits and alternatives for the proposed anesthesia with the patient or authorized representative who has indicated his/her understanding and acceptance.   Dental advisory given  Plan Discussed with: CRNA and Anesthesiologist  Anesthesia Plan Comments: (Discussed risks/benefits/alternatives to MAC sedation including need for ventilatory support, hypotension, need for conversion to general  anesthesia.  All patient questions answered.  Patient wished to proceed.)        Anesthesia Quick Evaluation

## 2014-09-24 ENCOUNTER — Ambulatory Visit (HOSPITAL_COMMUNITY): Payer: Medicare Other | Admitting: Anesthesiology

## 2014-09-24 ENCOUNTER — Encounter (HOSPITAL_COMMUNITY)
Admission: RE | Disposition: A | Discharge status: Home / Self Care | Payer: Self-pay | Source: Ambulatory Visit | Attending: Gastroenterology

## 2014-09-24 ENCOUNTER — Ambulatory Visit (HOSPITAL_COMMUNITY)
Admission: RE | Admit: 2014-09-24 | Discharge: 2014-09-24 | Disposition: A | Discharge status: Home / Self Care | Payer: Medicare Other | Source: Ambulatory Visit | Attending: Gastroenterology | Admitting: Gastroenterology

## 2014-09-24 ENCOUNTER — Encounter (HOSPITAL_COMMUNITY): Payer: Self-pay | Admitting: Gastroenterology

## 2014-09-24 DIAGNOSIS — K862 Cyst of pancreas: Secondary | ICD-10-CM | POA: Insufficient documentation

## 2014-09-24 DIAGNOSIS — E780 Pure hypercholesterolemia: Secondary | ICD-10-CM | POA: Insufficient documentation

## 2014-09-24 DIAGNOSIS — K868 Other specified diseases of pancreas: Secondary | ICD-10-CM | POA: Insufficient documentation

## 2014-09-24 DIAGNOSIS — R109 Unspecified abdominal pain: Secondary | ICD-10-CM | POA: Diagnosis not present

## 2014-09-24 DIAGNOSIS — Z79899 Other long term (current) drug therapy: Secondary | ICD-10-CM | POA: Insufficient documentation

## 2014-09-24 DIAGNOSIS — M5136 Other intervertebral disc degeneration, lumbar region: Secondary | ICD-10-CM | POA: Insufficient documentation

## 2014-09-24 DIAGNOSIS — I10 Essential (primary) hypertension: Secondary | ICD-10-CM | POA: Diagnosis not present

## 2014-09-24 DIAGNOSIS — M1990 Unspecified osteoarthritis, unspecified site: Secondary | ICD-10-CM | POA: Diagnosis not present

## 2014-09-24 DIAGNOSIS — Z6821 Body mass index (BMI) 21.0-21.9, adult: Secondary | ICD-10-CM | POA: Diagnosis not present

## 2014-09-24 DIAGNOSIS — R634 Abnormal weight loss: Secondary | ICD-10-CM | POA: Diagnosis not present

## 2014-09-24 DIAGNOSIS — K7689 Other specified diseases of liver: Secondary | ICD-10-CM | POA: Insufficient documentation

## 2014-09-24 DIAGNOSIS — I4891 Unspecified atrial fibrillation: Secondary | ICD-10-CM | POA: Diagnosis not present

## 2014-09-24 DIAGNOSIS — R970 Elevated carcinoembryonic antigen [CEA]: Secondary | ICD-10-CM | POA: Diagnosis not present

## 2014-09-24 HISTORY — PX: EUS: SHX5427

## 2014-09-24 LAB — PANC CYST FLD ANLYS-PATHFNDR-TG

## 2014-09-24 SURGERY — UPPER ENDOSCOPIC ULTRASOUND (EUS) RADIAL
Anesthesia: Monitor Anesthesia Care

## 2014-09-24 MED ORDER — CIPROFLOXACIN IN D5W 400 MG/200ML IV SOLN
INTRAVENOUS | Status: AC
  Filled 2014-09-24: qty 200

## 2014-09-24 MED ORDER — PROPOFOL INFUSION 10 MG/ML OPTIME
INTRAVENOUS | Status: DC | PRN
  Administered 2014-09-24: 60 ug/kg/min via INTRAVENOUS
  Administered 2014-09-24: 100 ug/kg/min via INTRAVENOUS
  Administered 2014-09-24: 75 ug/kg/min via INTRAVENOUS

## 2014-09-24 MED ORDER — CIPROFLOXACIN IN D5W 400 MG/200ML IV SOLN
400.0000 mg | Freq: Once | INTRAVENOUS | Status: AC
  Administered 2014-09-24: 400 mg via INTRAVENOUS

## 2014-09-24 MED ORDER — PROPOFOL 500 MG/50ML IV EMUL
INTRAVENOUS | Status: DC | PRN
  Administered 2014-09-24 (×5): 30 mg via INTRAVENOUS

## 2014-09-24 MED ORDER — PROPOFOL 10 MG/ML IV BOLUS
INTRAVENOUS | Status: AC
  Filled 2014-09-24: qty 20

## 2014-09-24 MED ORDER — LACTATED RINGERS IV SOLN
INTRAVENOUS | Status: DC
  Administered 2014-09-24: 1000 mL via INTRAVENOUS

## 2014-09-24 MED ORDER — CIPROFLOXACIN HCL 500 MG PO TABS: 500.0000 mg | ORAL_TABLET | Freq: Two times a day (BID) | ORAL | Status: DC

## 2014-09-24 MED ORDER — SODIUM CHLORIDE 0.9 % IV SOLN: INTRAVENOUS | Status: DC

## 2014-09-24 NOTE — Transfer of Care (Signed)
Immediate Anesthesia Transfer of Care Note  Patient: Crystal Cooke  Procedure(s) Performed: Procedure(s): UPPER ENDOSCOPIC ULTRASOUND (EUS) RADIAL (N/A)  Patient Location: PACU  Anesthesia Type:MAC  Level of Consciousness: sedated, patient cooperative and responds to stimulation  Airway & Oxygen Therapy: Patient Spontanous Breathing and Patient connected to nasal cannula oxygen  Post-op Assessment: Report given to RN and Post -op Vital signs reviewed and stable  Post vital signs: Reviewed and stable  Last Vitals:  Filed Vitals:   09/24/14 1038  BP: 137/82  Pulse: 82  Temp: 36.6 C  Resp: 22    Complications: No apparent anesthesia complications

## 2014-09-24 NOTE — Op Note (Signed)
[facility] [place], [id number]   ENDOSCOPIC ULTRASOUND PROCEDURE REPORT  PATIENT: Crystal, Cooke  MR#: [id number] BIRTHDATE: 1950-05-08  GENDER: female ENDOSCOPIST: Milus Banister, MD REFERRED BY:  Garfield Cornea, M.D. PROCEDURE DATE:  09/24/2014 PROCEDURE:   Upper EUS w/FNA ASA CLASS:      Class II INDICATIONS:   1.  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 MEDICATIONS: Monitored anesthesia care and cipro 400mg  IV  DESCRIPTION OF PROCEDURE:   After the risks benefits and alternatives of the procedure were  explained, informed consent was obtained. The patient was then placed in the left, lateral, decubitus postion and IV sedation was administered. Throughout the procedure, the patients blood pressure, pulse and oxygen saturations were monitored continuously.  Under direct visualization, the PENTAX EUS SCOPE  endoscope was introduced through the mouth  and advanced to the second portion of the duodenum .  Water was used as necessary to provide an acoustic interface.  Upon completion of the imaging, water was removed and the patient was sent to the recovery room in satisfactory condition.  Endoscopic findings (with radial and linear echoendocopes): 1. Normal UGI tract  EUS findings: 1. 3.5cm cystic lesion in the tail of pancreas. The lesion has multiple thin septea. There are no associated solid masses or mural nodules.  The main pancreatic duct is normal appearing. The cyst was nearly completely aspirated with a single transgastric pass with a 22 gauge EUS FNA needle, suction. Aspiration yielded 15cc of clear, thin fluid with flecks of clear debris. This was sent for fluid testing. 2. Pancreatic parenchyma was otherwise normal, the cyst described in uncinate pancreas was not visible on  this exam. 3. CBD was normal, non-dilated. 4. No peripancreatic adenopathy. 5. Limited views of liver, spleen, portal and splenic vessels were all normal except multiple scattered simple appearing liver cysts.   ENDOSCOPIC IMPRESSION: 3.5cm cystic lesion in the tail of pancreas without associated solid mass or abnormal main pancreatic duct.  Cyst fluid was aspirated, sent for cytology, CEA and amylase.  It is unclear if this cyst is causing any of her abdominal symptoms.  Await final fluid testing results.  She will complete 3 days of twice daily cipro.   _______________________________ eSigned:  Milus Banister, MD 09/24/2014 11:33 AM

## 2014-09-24 NOTE — Discharge Instructions (Signed)
Esophagogastroduodenoscopy °Care After °Refer to this sheet in the next few weeks. These instructions provide you with information on caring for yourself after your procedure. Your caregiver may also give you more specific instructions. Your treatment has been planned according to current medical practices, but problems sometimes occur. Call your caregiver if you have any problems or questions after your procedure.  °HOME CARE INSTRUCTIONS °· Do not eat or drink anything until the numbing medicine (local anesthetic) has worn off and your gag reflex has returned. You will know that the local anesthetic has worn off when you can swallow comfortably. °· Do not drive for 12 hours after the procedure or as directed by your caregiver. °· Only take medicines as directed by your caregiver. °SEEK MEDICAL CARE IF:  °· You cannot stop coughing. °· You are not urinating at all or less than usual. °SEEK IMMEDIATE MEDICAL CARE IF: °· You have difficulty swallowing. °· You cannot eat or drink. °· You have worsening throat or chest pain. °· You have dizziness, lightheadedness, or you faint. °· You have nausea or vomiting. °· You have chills. °· You have a fever. °· You have severe abdominal pain. °· You have black, tarry, or bloody stools. °Document Released: 01/10/2012 Document Reviewed: 01/10/2012 °ExitCare® Patient Information ©2015 [employer]. This information is not intended to replace advice given to you by your health care provider. Make sure you discuss any questions you have with your health care provider. ° °

## 2014-09-24 NOTE — Interval H&P Note (Signed)
History and Physical Interval Note:  09/24/2014 10:18 AM  Crystal Cooke  has presented today for surgery, with the diagnosis of panc cyst weight loss   The various methods of treatment have been discussed with the patient and family. After consideration of risks, benefits and other options for treatment, the patient has consented to  Procedure(s): UPPER ENDOSCOPIC ULTRASOUND (EUS) RADIAL (N/A) as a surgical intervention .  The patient's history has been reviewed, patient examined, no change in status, stable for surgery.  I have reviewed the patient's chart and labs.  Questions were answered to the patient's satisfaction.     Milus Banister

## 2014-09-24 NOTE — H&P (View-Only) (Signed)
Primary Care Physician:  Wendee Beavers, NP  Primary Gastroenterologist:  Garfield Cornea, MD   Chief Complaint  Patient presents with  . Abdominal Pain    HPI:  Crystal Cooke is a 64 y.o. female here for further evaluation of pancreatic cyst and abdominal pain. Patient complains of progressive abdominal pain especially over the last month but has been present over several months. Notes pain throughout her abdomen more in the left upper quadrant and radiating into her upper back. Pain is associated with abdominal bloating. Denies nausea or vomiting. Worse with meals. No improvement with PPI. Reports 10 pound weight loss over the past several months. Her appetite is poor. Generally has a bowel movement every 3 days, uses Dulcolax as needed. Denies blood in the stool or melena. Denies NSAID use. No heartburn, dysphagia.  Earlier this year patient states she was hospitalized with abdominal pain. Describes undergoing an upper endoscopy and colonoscopy with Dr. Britta Mccreedy with normal findings. We have requested records but have not received a time of this dictation. Patient states she could not get back in to see Dr. Britta Mccreedy in a timely fashion therefore PCP referred her to [place].  Patient recently had severe abdominal pain in which the emergency department on 08/10/2014 at [facility]. She underwent a CT of the abdomen pelvis with contrast which showed numerous hepatic cyst, largest measuring 10 cm in the inferior right liver. No biliary obstruction or stone. Previously seen main pancreatic duct enlargement had resolved. Interval enlargement of macroscopic cystic lesion in the tail the pancreas, currently 34 mm in diameter, previously was 27 mm back in September 2015, 21 mm back in 2012/05/29. A subcentimeter cyst again noted in the pancreas uncinate region. Possible 11 mm cyst in the left ovary needs reevaluation at some point.     Current Outpatient Prescriptions  Medication Sig Dispense Refill  .  acetaminophen (TYLENOL) 500 MG tablet Take 1,000 mg by mouth every 6 (six) hours as needed for mild pain.    Marland Kitchen ALPRAZolam (XANAX) 0.5 MG tablet 0.5 mg 2 (two) times daily as needed.    Marland Kitchen CARTIA XT 300 MG 24 hr capsule 300 mg daily.    . Multiple Vitamin (MULTIVITAMIN WITH MINERALS) TABS tablet Take 1 tablet by mouth daily.    . Omega-3 Fatty Acids (FISH OIL) 1000 MG CAPS Take 1 capsule by mouth daily.      .        No current facility-administered medications for this visit.    Allergies as of 08/28/2014 - Review Complete 08/28/2014  Allergen Reaction Noted  . Statins Swelling and Rash 08/31/2013    Past Medical History  Diagnosis Date  . Hypertension   . Hypercholesteremia   . Atrial fibrillation     ??? has never seen cardiologist per patient  . DDD (degenerative disc disease), lumbar   . Anxiety     Past Surgical History  Procedure Laterality Date  . None      Family History  Problem Relation Age of Onset  . Lung cancer Mother        . Hypertension Mother   . Diabetes Father   . Hypertension Father   . Lung cancer Father     deceased age 76  . Colon cancer Sister     deceased 30-May-2014, age 63  . Pancreatic cancer Neg Hx     History   Social History  . Marital Status: Widowed    Spouse Name: N/A  . Number of  Children: N/A  . Years of Education: N/A   Occupational History  . Not on file.   Social History Main Topics  . Smoking status: Never Smoker   . Smokeless tobacco: Never Used  . Alcohol Use: No  . Drug Use: No  . Sexual Activity: Not on file   Other Topics Concern  . Not on file   Social History Narrative      ROS:  General: see hpi. +fatigue Eyes: Negative for vision changes.  ENT: Negative for hoarseness, difficulty swallowing , nasal congestion. CV: Negative for chest pain, angina, palpitations, dyspnea on exertion, peripheral edema.  Respiratory: Negative for dyspnea at rest, dyspnea on exertion, cough, sputum, wheezing.  GI: See  history of present illness. GU:  Negative for dysuria, hematuria, urinary incontinence, urinary frequency, nocturnal urination.  MS: Negative for joint pain, low back pain.  Derm: Negative for rash or itching.  Neuro: Negative for weakness, abnormal sensation, seizure, frequent headaches, memory loss, confusion.  Psych: Negative for anxiety, depression, suicidal ideation, hallucinations.  Endo:see hpi Heme: Negative for bruising or bleeding. Allergy: Negative for rash or hives.    Physical Examination:  BP 134/84 mmHg  Pulse 96  Temp(Src) 98.3 F (36.8 C) (Oral)  Ht 5\' 2"  (1.575 m)  Wt 115 lb 6.4 oz (52.345 kg)  BMI 21.10 kg/m2   General: Well-nourished, well-developed in no acute distress.  Head: Normocephalic, atraumatic.   Eyes: Conjunctiva pink, no icterus. Mouth: Oropharyngeal mucosa moist and pink , no lesions erythema or exudate. Neck: Supple without thyromegaly, masses, or lymphadenopathy.  Lungs: Clear to auscultation bilaterally.  Heart: Regular rate and rhythm, no murmurs rubs or gallops.  Abdomen: Bowel sounds are normal, mild epigastric/luq tenderness, nondistended, no hepatosplenomegaly or masses, no abdominal bruits or    hernia , no rebound or guarding.   Rectal: not performed Extremities: No lower extremity edema. No clubbing or deformities.  Neuro: Alert and oriented x 4 , grossly normal neurologically.  Skin: Warm and dry, no rash or jaundice.   Psych: Alert and cooperative, normal mood and affect.  Labs: Labs dated 08/10/2014 Glucose 103, BUN 16, creatinine 0.72, calcium 9.7, albumen 4.2, alkaline phosphatase 112 high, AST 18.7, ALT 18, total bilirubin 0.7, lipase 19, white blood cell count 14,500, hemoglobin 13.1, platelets 207,000  08/26/2014 White blood cell count 7600, hemoglobin 12.8, hematocrit 38.7, platelets 203,000, potassium 3.2, sodium 141, lipase 17, amylase 1:15, calcium 10.3, albumen 4.79, ALT 15 AST 22.4, total bilirubin 0.9  Imaging  Studies: Chest x-ray on 08/10/2014, no active disease  CT abdomen pelvis with contrast on 08/10/2014  Comparison 10/20/2013  Numerous hepatic cysts, 10 cm cyst in the inferior right liver, no biliary obstruction or stone, previously seen Main duct enlargement has resolved (pancreas), interval enlargement of a macroscopic cystic lesion in the tail of the pancreas currently 34 mm in diameter, previously 27 mm. Seen in 2014, 21 mm at that time. Subcentimeter cyst again noted in the pancreas use and it region. Possible 11 mm cyst left ovary, reevaluate at time of follow-up.`    10/20/2013 CLINICAL DATA: Back pain  EXAM: CT ANGIOGRAPHY CHEST, ABDOMEN AND PELVIS  TECHNIQUE: Multidetector CT imaging through the chest, abdomen and pelvis was performed using the standard protocol during bolus administration of intravenous contrast. Multiplanar reconstructed images and MIPs were obtained and reviewed to evaluate the vascular anatomy.  CONTRAST: 48mL OMNIPAQUE IOHEXOL 350 MG/ML SOLN  COMPARISON: 09/22/2012  FINDINGS: CTA CHEST FINDINGS  No evidence of intramural hematoma, aortic dissection,  or aortic transection. Motion artifact is noted in the ascending aorta. Maximal diameter of the ascending aorta is 3.3 cm.  No obvious filling defect in the pulmonary arterial tree to suggest acute pulmonary thromboembolism.  Great vessels are patent within the confines of the exam. Visualized vertebral arteries are patent.  No abnormal mediastinal adenopathy.  Clear lungs  No acute bony deformity.  Review of the MIP images confirms the above findings.  CTA ABDOMEN AND PELVIS FINDINGS  No evidence of aortic dissection or aneurysm. Minimal atherosclerotic calcifications of the abdominal aorta are noted.  Celiac is patent. Branch vessels are patent. Accessory left hepatic artery.  SMA is patent. Branch vessels are patent.  IMA is patent and diminutive.  A single  right renal artery and 2 left renal arteries are patent.  Bilateral common iliac, external iliac, and internal iliac arteries are patent.  Innumerable liver cysts are stable.  Gallbladder, spleen, adrenal glands are within normal limits.  Small hypodensities in the kidneys are not significantly changed allowing for differences in technique. There is one hyperdensity in the left kidney measuring 7 mm on image 147 of series 501 that is stable.  There is a 2.8 cine m a lobulated cystic abnormality in the tail of the pancreas that is stable in size. There is a sub cm hypodensity in the uncinate process of the pancreas on image 163 which is probably not significantly changed. The pancreatic duct measures 5 mm in caliber which is mildly dilated. This is also stable.  Advanced degenerative change in the lower lumbar spine is not significantly changed. No vertebral compression deformity  Bladder, uterus, and adnexa are within normal limits.  No free-fluid. No abnormal retroperitoneal adenopathy.  Review of the MIP images confirms the above findings.  IMPRESSION: No evidence of thoracic or abdominal aortic aneurysm or dissection. No pulmonary embolism. No intramural hematoma.  Innumerable cysts in the liver are stable. Hypodensities, likely cysts, in the kidneys are also not significantly changed.  There are at least 2 low-density abnormalities within the pancreas likely cystic lesions. There are not significantly changed dating back 1 year. The pancreatic duct is dilated which is also a stable finding. One year follow-up was recommended to ensure stability as stability over 2 years supports benign etiology. If there is strong concern for pancreatic malignancy, MRI of the pancreas can be performed to further delineate.   Electronically Signed  By: Maryclare Bean M.D.  On: 10/20/2013 14:38

## 2014-09-24 NOTE — Anesthesia Postprocedure Evaluation (Signed)
  Anesthesia Post-op Note  Patient: Crystal Cooke  Procedure(s) Performed: Procedure(s): UPPER ENDOSCOPIC ULTRASOUND (EUS) RADIAL (N/A)  Patient Location: PACU  Anesthesia Type:MAC  Level of Consciousness: awake, alert , oriented and patient cooperative  Airway and Oxygen Therapy: Patient Spontanous Breathing  Post-op Pain: none  Post-op Assessment: Post-op Vital signs reviewed, Patient's Cardiovascular Status Stable, Respiratory Function Stable, Patent Airway and No signs of Nausea or vomiting              Post-op Vital Signs: Reviewed and stable  Last Vitals:  Filed Vitals:   09/24/14 1140  BP: 111/67  Pulse: 84  Temp:   Resp: 15    Complications: No apparent anesthesia complications

## 2014-09-25 ENCOUNTER — Encounter (HOSPITAL_COMMUNITY): Payer: Self-pay | Admitting: Gastroenterology

## 2014-10-06 ENCOUNTER — Encounter: Payer: Self-pay | Admitting: Internal Medicine

## 2014-10-06 ENCOUNTER — Ambulatory Visit (INDEPENDENT_AMBULATORY_CARE_PROVIDER_SITE_OTHER): Payer: Medicare Other | Admitting: Internal Medicine

## 2014-10-06 VITALS — BP 142/88 | HR 84 | Temp 97.9°F | Ht 61.0 in | Wt 116.6 lb

## 2014-10-06 DIAGNOSIS — K869 Disease of pancreas, unspecified: Secondary | ICD-10-CM | POA: Diagnosis not present

## 2014-10-06 DIAGNOSIS — K7689 Other specified diseases of liver: Secondary | ICD-10-CM

## 2014-10-06 DIAGNOSIS — K8689 Other specified diseases of pancreas: Secondary | ICD-10-CM

## 2014-10-06 NOTE — Progress Notes (Signed)
Primary Care Physician:  Wendee Beavers, NP Primary Gastroenterologist:  Dr. Gala Romney  Pre-Procedure History & Physical: HPI:  Crystal Cooke is a 64 y.o. 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 Patient has had insidious and progressive abdominal pain (medial left upper quadrant) for a number of months now. CT scanning in July demonstrated an enlarging complex cystic mass in the tail of her pancreas.  Multiple hepatic and renal cysts and a left ovarian cyst for which further evaluation was recommended  The largest hepatic cyst measured a good 10 cm. Patient continues to have a waxing and waning abdominal pain and some early satiety. Appetite is off. Weight down just 1 pound since her July 22 visit. She underwent endoscopic ultrasound with Dr. Ardis Hughs in [place] which revealed a likely precancerous process with a CEA ( 2,205 ng per cc, amylase 36; cytology showed some atypical cells). It was felt that surgical resection was in indicated. She is here in the office for further recommendations and guidance.    Past Medical History  Diagnosis Date  . Hypertension   . Hypercholesteremia   . Atrial fibrillation     ??? has never seen cardiologist per patient  . Anxiety   . DDD (degenerative disc disease), lumbar     Past Surgical History  Procedure Laterality Date  . None    . Eus N/A 09/24/2014    Procedure: UPPER ENDOSCOPIC ULTRASOUND (EUS) RADIAL;  Surgeon: Milus Banister, MD;  Location: [facility];  Service: Endoscopy;  Laterality: N/A;  . Colonoscopy  05/05/14    Dr.Benson- normal colonoscopy, retroflexed views revealed no abnormalities.   . Esophagogastroduodenoscopy  05/05/14    Dr.Benson- normal EGD, retroflexed views revealed no abnormalities     Prior to Admission medications   Medication Sig Start Date End Date Taking? Authorizing Provider  acetaminophen (TYLENOL) 500 MG tablet Take  1,000 mg by mouth every 6 (six) hours as needed for mild pain.   Yes Historical Provider, MD  CARTIA XT 300 MG 24 hr capsule Take 300 mg by mouth every morning.  08/26/14  Yes Historical Provider, MD  Multiple Vitamin (MULTIVITAMIN WITH MINERALS) TABS tablet Take 1 tablet by mouth every morning.    Yes Historical Provider, MD  Omega-3 Fatty Acids (FISH OIL) 1000 MG CAPS Take 1 capsule by mouth every morning.    Yes Historical Provider, MD  potassium chloride (K-DUR) 10 MEQ tablet Take 10 mEq by mouth 2 (two) times daily.   Yes Historical Provider, MD  ranitidine (ZANTAC) 150 MG tablet  08/11/14  Yes Historical Provider, MD  Tetrahydrozoline HCl (VISINE OP) Apply 1-2 drops to eye daily as needed (red eyes).   Yes Historical Provider, MD  ciprofloxacin (CIPRO) 500 MG tablet Take 1 tablet (500 mg total) by mouth 2 (two) times daily. Patient not taking: Reported on 10/06/2014 09/24/14   Milus Banister, MD  HYDROcodone-acetaminophen (NORCO/VICODIN) 5-325 MG per tablet Take 1 tablet by mouth every 4 (four) hours as needed for moderate pain or severe pain. Patient not taking: Reported on 10/06/2014 08/28/14   Mahala Menghini, PA-C  nitrofurantoin, macrocrystal-monohydrate, (MACROBID) 100 MG capsule  09/17/14   Historical Provider, MD    Allergies as of 10/06/2014 - Review Complete 10/06/2014  Allergen Reaction Noted  . Statins Swelling and Rash 08/31/2013    Family History  Problem Relation Age of Onset  . Lung cancer Mother        .  Hypertension Mother   . Diabetes Father   . Hypertension Father   . Lung cancer Father     deceased age 79  . Colon cancer Sister     deceased 05-09-2014, age 55  . Pancreatic cancer Neg Hx     Social History   Social History  . Marital Status: Widowed    Spouse Name: N/A  . Number of Children: N/A  . Years of Education: N/A   Occupational History  . Not on file.   Social History Main Topics  . Smoking status: Never Smoker   . Smokeless tobacco: Never Used  .  Alcohol Use: No  . Drug Use: No  . Sexual Activity: Not on file   Other Topics Concern  . Not on file   Social History Narrative    Review of Systems: See HPI, otherwise negative ROS  Physical Exam: BP 142/88 mmHg  Pulse 84  Temp(Src) 97.9 F (36.6 C)  Ht 5\' 1"  (1.549 m)  Wt 116 lb 9.6 oz (52.889 kg)  BMI 22.04 kg/m2 General:   Very pleasant lady found in no acute distress. She is accompanied by 5 family members and her minister. Skin:  Intact without significant lesions or rashes. Eyes:  Sclera clear, no icterus.   Conjunctiva pink. Lungs:  Clear throughout to auscultation.   No wheezes, crackles, or rhonchi. No acute distress. Heart:  Regular rate and rhythm; no murmurs, clicks, rubs,  or gallops. Abdomen: Non-distended, normal bowel sounds. She has some minimal epigastric tenderness to palpation. No appreciable mass or organomegaly Pulses:  Normal pulses noted. Extremities:  Without clubbing or edema.  Impression:  Pleasant 64 year old lady found to have an enlarging complex cystic lesion in tail of her pancreas. Atypical cells on cytology. CEA markedly elevated. I agree with Dr. Ardis Hughs, given characteristics of this lesion, she ought to pursue resection. Family members had multiple questions about the multiple cysts found elsewhere. She does have large cyst in her liver. Certainly, large liver cyst can cause pain, however, the most important pressing issue at this time is the pancreatic lesion.  Recommendations:  Expedited referral Dr. Lawson Radar at [facility] for consideration of excision of the pancreatic lesion.  Would like to hear his opinion regarding the hepatic cysts as well. Patient is to retrieve her imaging studies done at [facility] on CD so she can hand carry them with her to the appointment.  She is quite anxious at this time and asked for a refill on her Xanax. I have agreed to give her one prescription of Xanax 0.5 mg twice a day as needed (dispense #40)  with no refills.     Notice: This dictation was prepared with Dragon dictation along with smaller phrase technology. Any transcriptional errors that result from this process are unintentional and may not be corrected upon review.

## 2014-10-06 NOTE — Patient Instructions (Signed)
Will refer you to Dr. Lawson Radar at [facility] for the pancreatic lesion  We will get X-rays on CD to hand deliver to Dr. Eugenia Pancoast at [facility]   Xanax 0.5 mg twice daily as needed for anxiety (disp #40) - no refills

## 2014-10-07 ENCOUNTER — Other Ambulatory Visit: Payer: Self-pay

## 2014-10-07 DIAGNOSIS — K869 Disease of pancreas, unspecified: Secondary | ICD-10-CM

## 2014-10-13 DIAGNOSIS — N8320 Unspecified ovarian cysts: Secondary | ICD-10-CM | POA: Diagnosis not present

## 2014-10-13 DIAGNOSIS — K7689 Other specified diseases of liver: Secondary | ICD-10-CM | POA: Diagnosis not present

## 2014-10-13 DIAGNOSIS — F419 Anxiety disorder, unspecified: Secondary | ICD-10-CM | POA: Diagnosis not present

## 2014-10-13 DIAGNOSIS — K862 Cyst of pancreas: Secondary | ICD-10-CM | POA: Diagnosis not present

## 2014-10-13 DIAGNOSIS — R970 Elevated carcinoembryonic antigen [CEA]: Secondary | ICD-10-CM | POA: Diagnosis not present

## 2014-10-13 DIAGNOSIS — I10 Essential (primary) hypertension: Secondary | ICD-10-CM | POA: Diagnosis not present

## 2014-10-13 DIAGNOSIS — I4891 Unspecified atrial fibrillation: Secondary | ICD-10-CM | POA: Diagnosis not present

## 2014-10-13 DIAGNOSIS — Z23 Encounter for immunization: Secondary | ICD-10-CM | POA: Diagnosis not present

## 2014-10-13 DIAGNOSIS — N281 Cyst of kidney, acquired: Secondary | ICD-10-CM | POA: Diagnosis not present

## 2014-10-13 DIAGNOSIS — M5136 Other intervertebral disc degeneration, lumbar region: Secondary | ICD-10-CM | POA: Diagnosis not present

## 2014-10-13 DIAGNOSIS — K869 Disease of pancreas, unspecified: Secondary | ICD-10-CM | POA: Diagnosis not present

## 2014-10-13 DIAGNOSIS — E780 Pure hypercholesterolemia: Secondary | ICD-10-CM | POA: Diagnosis not present

## 2014-10-22 DIAGNOSIS — I10 Essential (primary) hypertension: Secondary | ICD-10-CM | POA: Diagnosis not present

## 2014-10-22 DIAGNOSIS — I4891 Unspecified atrial fibrillation: Secondary | ICD-10-CM | POA: Diagnosis not present

## 2014-10-22 DIAGNOSIS — Z01810 Encounter for preprocedural cardiovascular examination: Secondary | ICD-10-CM | POA: Diagnosis not present

## 2014-10-23 DIAGNOSIS — Z01810 Encounter for preprocedural cardiovascular examination: Secondary | ICD-10-CM | POA: Diagnosis not present

## 2014-10-23 DIAGNOSIS — I10 Essential (primary) hypertension: Secondary | ICD-10-CM | POA: Diagnosis not present

## 2014-10-30 DIAGNOSIS — Z800 Family history of malignant neoplasm of digestive organs: Secondary | ICD-10-CM | POA: Diagnosis not present

## 2014-10-30 DIAGNOSIS — T398X5A Adverse effect of other nonopioid analgesics and antipyretics, not elsewhere classified, initial encounter: Secondary | ICD-10-CM | POA: Diagnosis not present

## 2014-10-30 DIAGNOSIS — K862 Cyst of pancreas: Secondary | ICD-10-CM | POA: Diagnosis present

## 2014-10-30 DIAGNOSIS — I480 Paroxysmal atrial fibrillation: Secondary | ICD-10-CM | POA: Diagnosis present

## 2014-10-30 DIAGNOSIS — D735 Infarction of spleen: Secondary | ICD-10-CM | POA: Diagnosis present

## 2014-10-30 DIAGNOSIS — N8320 Unspecified ovarian cysts: Secondary | ICD-10-CM | POA: Diagnosis present

## 2014-10-30 DIAGNOSIS — G8929 Other chronic pain: Secondary | ICD-10-CM | POA: Diagnosis present

## 2014-10-30 DIAGNOSIS — M5136 Other intervertebral disc degeneration, lumbar region: Secondary | ICD-10-CM | POA: Diagnosis present

## 2014-10-30 DIAGNOSIS — C257 Malignant neoplasm of other parts of pancreas: Secondary | ICD-10-CM | POA: Diagnosis present

## 2014-10-30 DIAGNOSIS — D490 Neoplasm of unspecified behavior of digestive system: Secondary | ICD-10-CM | POA: Diagnosis not present

## 2014-10-30 DIAGNOSIS — F419 Anxiety disorder, unspecified: Secondary | ICD-10-CM | POA: Diagnosis present

## 2014-10-30 DIAGNOSIS — G893 Neoplasm related pain (acute) (chronic): Secondary | ICD-10-CM | POA: Diagnosis not present

## 2014-10-30 DIAGNOSIS — Z7982 Long term (current) use of aspirin: Secondary | ICD-10-CM | POA: Diagnosis not present

## 2014-10-30 DIAGNOSIS — G8918 Other acute postprocedural pain: Secondary | ICD-10-CM | POA: Diagnosis present

## 2014-10-30 DIAGNOSIS — I952 Hypotension due to drugs: Secondary | ICD-10-CM | POA: Diagnosis not present

## 2014-10-30 DIAGNOSIS — I10 Essential (primary) hypertension: Secondary | ICD-10-CM | POA: Diagnosis present

## 2014-10-30 DIAGNOSIS — K7689 Other specified diseases of liver: Secondary | ICD-10-CM | POA: Diagnosis present

## 2014-10-30 DIAGNOSIS — Z801 Family history of malignant neoplasm of trachea, bronchus and lung: Secondary | ICD-10-CM | POA: Diagnosis not present

## 2014-10-30 DIAGNOSIS — K869 Disease of pancreas, unspecified: Secondary | ICD-10-CM | POA: Diagnosis not present

## 2014-10-30 DIAGNOSIS — Z87442 Personal history of urinary calculi: Secondary | ICD-10-CM | POA: Diagnosis not present

## 2014-10-30 DIAGNOSIS — R000 Tachycardia, unspecified: Secondary | ICD-10-CM | POA: Diagnosis not present

## 2014-10-30 DIAGNOSIS — D62 Acute posthemorrhagic anemia: Secondary | ICD-10-CM | POA: Diagnosis not present

## 2014-10-30 DIAGNOSIS — N281 Cyst of kidney, acquired: Secondary | ICD-10-CM | POA: Diagnosis present

## 2014-10-30 DIAGNOSIS — E780 Pure hypercholesterolemia: Secondary | ICD-10-CM | POA: Diagnosis present

## 2014-10-30 DIAGNOSIS — R109 Unspecified abdominal pain: Secondary | ICD-10-CM | POA: Diagnosis not present

## 2014-11-07 DIAGNOSIS — R0602 Shortness of breath: Secondary | ICD-10-CM | POA: Diagnosis not present

## 2014-11-07 DIAGNOSIS — Z5320 Procedure and treatment not carried out because of patient's decision for unspecified reasons: Secondary | ICD-10-CM | POA: Diagnosis not present

## 2014-11-07 DIAGNOSIS — R109 Unspecified abdominal pain: Secondary | ICD-10-CM | POA: Diagnosis not present

## 2014-11-07 DIAGNOSIS — R1084 Generalized abdominal pain: Secondary | ICD-10-CM | POA: Diagnosis not present

## 2014-11-09 DIAGNOSIS — K5900 Constipation, unspecified: Secondary | ICD-10-CM | POA: Diagnosis not present

## 2014-11-09 DIAGNOSIS — K869 Disease of pancreas, unspecified: Secondary | ICD-10-CM | POA: Diagnosis not present

## 2014-11-09 DIAGNOSIS — R1013 Epigastric pain: Secondary | ICD-10-CM | POA: Diagnosis not present

## 2014-11-09 DIAGNOSIS — Z9889 Other specified postprocedural states: Secondary | ICD-10-CM | POA: Diagnosis not present

## 2014-11-09 DIAGNOSIS — K7689 Other specified diseases of liver: Secondary | ICD-10-CM | POA: Diagnosis not present

## 2014-11-09 DIAGNOSIS — Z7189 Other specified counseling: Secondary | ICD-10-CM | POA: Diagnosis not present

## 2014-11-20 DIAGNOSIS — Z131 Encounter for screening for diabetes mellitus: Secondary | ICD-10-CM | POA: Diagnosis not present

## 2014-11-20 DIAGNOSIS — I10 Essential (primary) hypertension: Secondary | ICD-10-CM | POA: Diagnosis not present

## 2014-11-20 DIAGNOSIS — K5909 Other constipation: Secondary | ICD-10-CM | POA: Diagnosis not present

## 2014-11-24 DIAGNOSIS — K7689 Other specified diseases of liver: Secondary | ICD-10-CM | POA: Diagnosis not present

## 2014-11-24 DIAGNOSIS — K862 Cyst of pancreas: Secondary | ICD-10-CM | POA: Diagnosis not present

## 2014-12-25 DIAGNOSIS — R1084 Generalized abdominal pain: Secondary | ICD-10-CM | POA: Diagnosis not present

## 2014-12-28 DIAGNOSIS — N390 Urinary tract infection, site not specified: Secondary | ICD-10-CM | POA: Diagnosis not present

## 2015-01-04 DIAGNOSIS — R630 Anorexia: Secondary | ICD-10-CM | POA: Diagnosis not present

## 2015-01-04 DIAGNOSIS — K863 Pseudocyst of pancreas: Secondary | ICD-10-CM | POA: Diagnosis not present

## 2015-01-11 DIAGNOSIS — K862 Cyst of pancreas: Secondary | ICD-10-CM | POA: Diagnosis not present

## 2015-01-11 DIAGNOSIS — Z9081 Acquired absence of spleen: Secondary | ICD-10-CM | POA: Insufficient documentation

## 2015-01-11 DIAGNOSIS — K8681 Exocrine pancreatic insufficiency: Secondary | ICD-10-CM | POA: Insufficient documentation

## 2015-01-11 DIAGNOSIS — K5900 Constipation, unspecified: Secondary | ICD-10-CM | POA: Diagnosis not present

## 2015-01-11 DIAGNOSIS — K458 Other specified abdominal hernia without obstruction or gangrene: Secondary | ICD-10-CM | POA: Diagnosis not present

## 2015-01-11 DIAGNOSIS — R1084 Generalized abdominal pain: Secondary | ICD-10-CM | POA: Diagnosis not present

## 2015-01-11 DIAGNOSIS — F419 Anxiety disorder, unspecified: Secondary | ICD-10-CM | POA: Diagnosis not present

## 2015-01-19 DIAGNOSIS — K458 Other specified abdominal hernia without obstruction or gangrene: Secondary | ICD-10-CM | POA: Diagnosis not present

## 2015-01-19 DIAGNOSIS — R1084 Generalized abdominal pain: Secondary | ICD-10-CM | POA: Diagnosis not present

## 2015-01-25 DIAGNOSIS — K862 Cyst of pancreas: Secondary | ICD-10-CM | POA: Diagnosis not present

## 2015-01-25 DIAGNOSIS — R1084 Generalized abdominal pain: Secondary | ICD-10-CM | POA: Diagnosis not present

## 2015-02-11 DIAGNOSIS — R102 Pelvic and perineal pain: Secondary | ICD-10-CM | POA: Diagnosis not present

## 2015-02-11 DIAGNOSIS — Z6821 Body mass index (BMI) 21.0-21.9, adult: Secondary | ICD-10-CM | POA: Diagnosis not present

## 2015-02-11 DIAGNOSIS — Z01419 Encounter for gynecological examination (general) (routine) without abnormal findings: Secondary | ICD-10-CM | POA: Diagnosis not present

## 2015-02-11 DIAGNOSIS — Z8742 Personal history of other diseases of the female genital tract: Secondary | ICD-10-CM | POA: Diagnosis not present

## 2015-02-17 LAB — HM PAP SMEAR: HM Pap smear: NEGATIVE

## 2015-03-09 DIAGNOSIS — Z90410 Acquired total absence of pancreas: Secondary | ICD-10-CM | POA: Diagnosis not present

## 2015-03-09 DIAGNOSIS — Z09 Encounter for follow-up examination after completed treatment for conditions other than malignant neoplasm: Secondary | ICD-10-CM | POA: Diagnosis not present

## 2015-03-09 DIAGNOSIS — K862 Cyst of pancreas: Secondary | ICD-10-CM | POA: Diagnosis not present

## 2015-03-11 DIAGNOSIS — Z79899 Other long term (current) drug therapy: Secondary | ICD-10-CM | POA: Diagnosis not present

## 2015-03-11 DIAGNOSIS — J018 Other acute sinusitis: Secondary | ICD-10-CM | POA: Diagnosis not present

## 2015-03-11 DIAGNOSIS — Z131 Encounter for screening for diabetes mellitus: Secondary | ICD-10-CM | POA: Diagnosis not present

## 2015-03-15 DIAGNOSIS — R102 Pelvic and perineal pain: Secondary | ICD-10-CM | POA: Diagnosis not present

## 2015-03-15 DIAGNOSIS — Z8742 Personal history of other diseases of the female genital tract: Secondary | ICD-10-CM | POA: Diagnosis not present

## 2015-06-16 NOTE — Progress Notes (Signed)
Patient had unremarkable pelvic u/s 03/2015 at [facility].

## 2015-07-01 DIAGNOSIS — I10 Essential (primary) hypertension: Secondary | ICD-10-CM | POA: Diagnosis not present

## 2015-07-01 DIAGNOSIS — Z0000 Encounter for general adult medical examination without abnormal findings: Secondary | ICD-10-CM | POA: Diagnosis not present

## 2015-07-01 DIAGNOSIS — J3089 Other allergic rhinitis: Secondary | ICD-10-CM | POA: Diagnosis not present

## 2015-08-05 DIAGNOSIS — I10 Essential (primary) hypertension: Secondary | ICD-10-CM | POA: Diagnosis not present

## 2015-08-05 DIAGNOSIS — F419 Anxiety disorder, unspecified: Secondary | ICD-10-CM | POA: Diagnosis not present

## 2015-08-05 DIAGNOSIS — R208 Other disturbances of skin sensation: Secondary | ICD-10-CM | POA: Diagnosis not present

## 2015-08-05 DIAGNOSIS — H9201 Otalgia, right ear: Secondary | ICD-10-CM | POA: Diagnosis not present

## 2015-08-05 DIAGNOSIS — R319 Hematuria, unspecified: Secondary | ICD-10-CM | POA: Diagnosis not present

## 2015-08-05 DIAGNOSIS — R3129 Other microscopic hematuria: Secondary | ICD-10-CM | POA: Diagnosis not present

## 2015-08-31 DIAGNOSIS — I4891 Unspecified atrial fibrillation: Secondary | ICD-10-CM | POA: Diagnosis not present

## 2015-08-31 DIAGNOSIS — I10 Essential (primary) hypertension: Secondary | ICD-10-CM | POA: Diagnosis not present

## 2015-08-31 DIAGNOSIS — F419 Anxiety disorder, unspecified: Secondary | ICD-10-CM | POA: Diagnosis not present

## 2015-08-31 DIAGNOSIS — N390 Urinary tract infection, site not specified: Secondary | ICD-10-CM | POA: Diagnosis not present

## 2015-08-31 DIAGNOSIS — Z139 Encounter for screening, unspecified: Secondary | ICD-10-CM | POA: Diagnosis not present

## 2015-09-08 DIAGNOSIS — Z135 Encounter for screening for eye and ear disorders: Secondary | ICD-10-CM | POA: Diagnosis not present

## 2015-09-16 DIAGNOSIS — M25511 Pain in right shoulder: Secondary | ICD-10-CM | POA: Diagnosis not present

## 2015-09-16 DIAGNOSIS — H9201 Otalgia, right ear: Secondary | ICD-10-CM | POA: Diagnosis not present

## 2015-09-16 DIAGNOSIS — Z7251 High risk heterosexual behavior: Secondary | ICD-10-CM | POA: Diagnosis not present

## 2015-09-16 DIAGNOSIS — N898 Other specified noninflammatory disorders of vagina: Secondary | ICD-10-CM | POA: Diagnosis not present

## 2015-09-16 DIAGNOSIS — Z01419 Encounter for gynecological examination (general) (routine) without abnormal findings: Secondary | ICD-10-CM | POA: Diagnosis not present

## 2015-09-16 DIAGNOSIS — M19011 Primary osteoarthritis, right shoulder: Secondary | ICD-10-CM | POA: Diagnosis not present

## 2015-10-13 DIAGNOSIS — M7541 Impingement syndrome of right shoulder: Secondary | ICD-10-CM | POA: Diagnosis not present

## 2015-10-13 DIAGNOSIS — M25511 Pain in right shoulder: Secondary | ICD-10-CM | POA: Diagnosis not present

## 2015-10-14 DIAGNOSIS — I10 Essential (primary) hypertension: Secondary | ICD-10-CM | POA: Diagnosis not present

## 2015-10-14 DIAGNOSIS — M66821 Spontaneous rupture of other tendons, right upper arm: Secondary | ICD-10-CM | POA: Diagnosis not present

## 2015-10-14 DIAGNOSIS — S46211A Strain of muscle, fascia and tendon of other parts of biceps, right arm, initial encounter: Secondary | ICD-10-CM | POA: Diagnosis not present

## 2015-10-14 DIAGNOSIS — X501XXA Overexertion from prolonged static or awkward postures, initial encounter: Secondary | ICD-10-CM | POA: Diagnosis not present

## 2015-10-14 DIAGNOSIS — Z79899 Other long term (current) drug therapy: Secondary | ICD-10-CM | POA: Diagnosis not present

## 2015-11-03 DIAGNOSIS — S46211A Strain of muscle, fascia and tendon of other parts of biceps, right arm, initial encounter: Secondary | ICD-10-CM | POA: Diagnosis not present

## 2015-11-03 DIAGNOSIS — M7541 Impingement syndrome of right shoulder: Secondary | ICD-10-CM | POA: Diagnosis not present

## 2015-11-05 DIAGNOSIS — Z1231 Encounter for screening mammogram for malignant neoplasm of breast: Secondary | ICD-10-CM | POA: Diagnosis not present

## 2015-11-09 LAB — HM MAMMOGRAPHY: HM MAMMO: NORMAL (ref 0–4)

## 2015-11-14 DIAGNOSIS — N390 Urinary tract infection, site not specified: Secondary | ICD-10-CM | POA: Diagnosis not present

## 2015-11-14 DIAGNOSIS — X58XXXA Exposure to other specified factors, initial encounter: Secondary | ICD-10-CM | POA: Diagnosis not present

## 2015-11-14 DIAGNOSIS — Z79899 Other long term (current) drug therapy: Secondary | ICD-10-CM | POA: Diagnosis not present

## 2015-11-14 DIAGNOSIS — I10 Essential (primary) hypertension: Secondary | ICD-10-CM | POA: Diagnosis not present

## 2015-11-14 DIAGNOSIS — N3000 Acute cystitis without hematuria: Secondary | ICD-10-CM | POA: Diagnosis not present

## 2015-11-14 DIAGNOSIS — Z23 Encounter for immunization: Secondary | ICD-10-CM | POA: Diagnosis not present

## 2015-11-14 DIAGNOSIS — S91331A Puncture wound without foreign body, right foot, initial encounter: Secondary | ICD-10-CM | POA: Diagnosis not present

## 2015-11-23 DIAGNOSIS — R350 Frequency of micturition: Secondary | ICD-10-CM | POA: Diagnosis not present

## 2015-11-23 DIAGNOSIS — R3129 Other microscopic hematuria: Secondary | ICD-10-CM | POA: Diagnosis not present

## 2015-11-23 DIAGNOSIS — R109 Unspecified abdominal pain: Secondary | ICD-10-CM | POA: Diagnosis not present

## 2015-11-23 DIAGNOSIS — Z9081 Acquired absence of spleen: Secondary | ICD-10-CM | POA: Diagnosis not present

## 2015-11-23 DIAGNOSIS — Z87442 Personal history of urinary calculi: Secondary | ICD-10-CM | POA: Diagnosis not present

## 2015-11-24 DIAGNOSIS — M75101 Unspecified rotator cuff tear or rupture of right shoulder, not specified as traumatic: Secondary | ICD-10-CM | POA: Diagnosis not present

## 2015-11-24 DIAGNOSIS — M19011 Primary osteoarthritis, right shoulder: Secondary | ICD-10-CM | POA: Diagnosis not present

## 2015-11-24 DIAGNOSIS — M7551 Bursitis of right shoulder: Secondary | ICD-10-CM | POA: Diagnosis not present

## 2015-11-24 DIAGNOSIS — M75121 Complete rotator cuff tear or rupture of right shoulder, not specified as traumatic: Secondary | ICD-10-CM | POA: Diagnosis not present

## 2015-11-24 DIAGNOSIS — S46111A Strain of muscle, fascia and tendon of long head of biceps, right arm, initial encounter: Secondary | ICD-10-CM | POA: Diagnosis not present

## 2015-11-24 DIAGNOSIS — M7541 Impingement syndrome of right shoulder: Secondary | ICD-10-CM | POA: Diagnosis not present

## 2015-11-30 DIAGNOSIS — R3129 Other microscopic hematuria: Secondary | ICD-10-CM | POA: Diagnosis not present

## 2015-11-30 DIAGNOSIS — K769 Liver disease, unspecified: Secondary | ICD-10-CM | POA: Diagnosis not present

## 2015-11-30 DIAGNOSIS — R109 Unspecified abdominal pain: Secondary | ICD-10-CM | POA: Diagnosis not present

## 2015-11-30 DIAGNOSIS — R350 Frequency of micturition: Secondary | ICD-10-CM | POA: Diagnosis not present

## 2015-11-30 DIAGNOSIS — I700 Atherosclerosis of aorta: Secondary | ICD-10-CM | POA: Diagnosis not present

## 2015-12-06 DIAGNOSIS — R109 Unspecified abdominal pain: Secondary | ICD-10-CM | POA: Diagnosis not present

## 2015-12-06 DIAGNOSIS — R350 Frequency of micturition: Secondary | ICD-10-CM | POA: Diagnosis not present

## 2016-01-04 DIAGNOSIS — E559 Vitamin D deficiency, unspecified: Secondary | ICD-10-CM | POA: Diagnosis not present

## 2016-01-04 DIAGNOSIS — I10 Essential (primary) hypertension: Secondary | ICD-10-CM | POA: Diagnosis not present

## 2016-01-04 DIAGNOSIS — E7800 Pure hypercholesterolemia, unspecified: Secondary | ICD-10-CM | POA: Diagnosis not present

## 2016-01-04 DIAGNOSIS — H9201 Otalgia, right ear: Secondary | ICD-10-CM | POA: Diagnosis not present

## 2016-01-05 DIAGNOSIS — M75121 Complete rotator cuff tear or rupture of right shoulder, not specified as traumatic: Secondary | ICD-10-CM | POA: Diagnosis not present

## 2016-01-13 DIAGNOSIS — N390 Urinary tract infection, site not specified: Secondary | ICD-10-CM | POA: Diagnosis not present

## 2016-01-13 DIAGNOSIS — M25562 Pain in left knee: Secondary | ICD-10-CM | POA: Diagnosis not present

## 2016-01-26 DIAGNOSIS — M7661 Achilles tendinitis, right leg: Secondary | ICD-10-CM | POA: Diagnosis not present

## 2016-01-26 DIAGNOSIS — M25511 Pain in right shoulder: Secondary | ICD-10-CM | POA: Diagnosis not present

## 2016-01-26 DIAGNOSIS — M7541 Impingement syndrome of right shoulder: Secondary | ICD-10-CM | POA: Diagnosis not present

## 2016-01-26 DIAGNOSIS — M75121 Complete rotator cuff tear or rupture of right shoulder, not specified as traumatic: Secondary | ICD-10-CM | POA: Diagnosis not present

## 2016-02-14 ENCOUNTER — Ambulatory Visit (INDEPENDENT_AMBULATORY_CARE_PROVIDER_SITE_OTHER): Payer: Medicare Other | Admitting: Otolaryngology

## 2016-02-15 ENCOUNTER — Ambulatory Visit: Payer: Medicare Other | Admitting: Family Medicine

## 2016-02-23 ENCOUNTER — Ambulatory Visit: Payer: Medicare Other | Admitting: Family Medicine

## 2016-02-24 ENCOUNTER — Ambulatory Visit: Payer: Medicare Other | Admitting: Family Medicine

## 2016-02-29 ENCOUNTER — Ambulatory Visit: Payer: Medicare Other | Admitting: Family Medicine

## 2016-03-02 ENCOUNTER — Other Ambulatory Visit: Payer: Self-pay | Admitting: Family Medicine

## 2016-03-02 ENCOUNTER — Ambulatory Visit (INDEPENDENT_AMBULATORY_CARE_PROVIDER_SITE_OTHER): Payer: Medicare Other | Admitting: Family Medicine

## 2016-03-02 ENCOUNTER — Encounter: Payer: Self-pay | Admitting: Family Medicine

## 2016-03-02 VITALS — BP 138/80 | HR 80 | Temp 98.0°F | Resp 16 | Ht 61.0 in | Wt 127.0 lb

## 2016-03-02 DIAGNOSIS — R319 Hematuria, unspecified: Secondary | ICD-10-CM | POA: Insufficient documentation

## 2016-03-02 DIAGNOSIS — Z7689 Persons encountering health services in other specified circumstances: Secondary | ICD-10-CM | POA: Diagnosis not present

## 2016-03-02 DIAGNOSIS — N390 Urinary tract infection, site not specified: Secondary | ICD-10-CM

## 2016-03-02 DIAGNOSIS — E785 Hyperlipidemia, unspecified: Secondary | ICD-10-CM

## 2016-03-02 DIAGNOSIS — M75121 Complete rotator cuff tear or rupture of right shoulder, not specified as traumatic: Secondary | ICD-10-CM

## 2016-03-02 DIAGNOSIS — K862 Cyst of pancreas: Secondary | ICD-10-CM

## 2016-03-02 DIAGNOSIS — I10 Essential (primary) hypertension: Secondary | ICD-10-CM | POA: Insufficient documentation

## 2016-03-02 DIAGNOSIS — M75100 Unspecified rotator cuff tear or rupture of unspecified shoulder, not specified as traumatic: Secondary | ICD-10-CM | POA: Insufficient documentation

## 2016-03-02 LAB — POCT URINALYSIS DIPSTICK
Bilirubin, UA: NEGATIVE
GLUCOSE UA: NEGATIVE
KETONES UA: NEGATIVE
LEUKOCYTES UA: NEGATIVE
Nitrite, UA: NEGATIVE
PROTEIN UA: 30
SPEC GRAV UA: 1.015
Urobilinogen, UA: 0.2
pH, UA: 7.5

## 2016-03-02 NOTE — Patient Instructions (Signed)
Need records PCP  Drink lots of water to prevent bladder infections  Call up with your medicines  See me in a month  I have placed a referral to Urology  [facility] here in [place] is associated with [facility]

## 2016-03-02 NOTE — Progress Notes (Signed)
Chief Complaint  Patient presents with  . Establish Care   New to establish Lives alone in [place] old records She didn't bring her medicines No longer needs PAP last done 02/2015, mammogram 10/2015 and colonoscopy up to date All shots up to date Eats well and walks for exercise Sees eye doctor regularly  She states her prior doctor referred her to urology for persistent hematuria.  She has not yet been able to go.  Wants new referral.  No dysuria or frequency noted.  Patient Active Problem List   Diagnosis Date Noted  . Recurrent UTI (urinary tract infection) 03/02/2016  . Torn rotator cuff 03/02/2016  . Essential hypertension 03/02/2016  . HLD (hyperlipidemia) 03/02/2016  . Hematuria 03/02/2016  . 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 Pancreatic cyst 08/28/2014  . Hepatic cyst 08/28/2014    Outpatient Encounter Prescriptions as of 03/02/2016  Medication Sig  . acetaminophen (TYLENOL) 500 MG tablet Take 1,000 mg by mouth every 6 (six) hours as needed for mild pain.  Marland Kitchen CARTIA XT 300 MG 24 hr capsule Take 300 mg by mouth every morning.   . Multiple Vitamin (MULTIVITAMIN WITH MINERALS) TABS tablet Take 1 tablet by mouth every morning.   . Omega-3 Fatty Acids (FISH OIL) 1000 MG CAPS Take 1 capsule by mouth every morning.   Marland Kitchen omeprazole (PRILOSEC) 20 MG capsule Take 20 mg by mouth.  . Potassium 99 MG TABS Take by mouth.  . Red Yeast Rice 600 MG CAPS Take by mouth.   No facility-administered encounter medications on file as of 03/02/2016.    Past Medical History:  Diagnosis Date  . Allergy   . Anxiety   . Atrial fibrillation ([place])    ??? has never seen cardiologist per patient  . DDD (degenerative disc disease), lumbar   . Hypercholesteremia   . Hypertension     Past Surgical History:  Procedure Laterality Date  . COLONOSCOPY  05/05/14   Dr.Benson- normal colonoscopy, retroflexed views revealed no abnormalities.   . ESOPHAGOGASTRODUODENOSCOPY  05/05/14   Dr.Benson- normal  EGD, retroflexed views revealed no abnormalities   . EUS N/A 09/24/2014   Procedure: UPPER ENDOSCOPIC ULTRASOUND (EUS) RADIAL;  Surgeon: Milus Banister, MD;  Location: [facility];  Service: Endoscopy;  Laterality: N/A;  . none    . PANCREAS SURGERY     partial    Social History   Social History  . Marital status: Widowed    Spouse name: N/A  . Number of children: 5  . Years of education: 12   Occupational History  . 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 Smoking status: Never Smoker  . Smokeless tobacco: Never Used  . Alcohol use No  . Drug use: No  . Sexual activity: Not Currently   Other Topics Concern  . Not on file   Social History Narrative   Lives alone   [facility] for exercise    Family History  Problem Relation Age of Onset  . Lung cancer Mother        . Hypertension Mother   . Cancer Mother   . Diabetes Father   . Hypertension Father   . Lung cancer Father     deceased age 51  . Cancer Father   . Colon cancer Sister     deceased 05/11/14, age 45  . Pancreatic cancer Neg Hx     Review of Systems  Constitutional: Negative for chills, fever and  weight loss.  HENT: Negative for congestion and hearing loss.   Eyes: Negative for blurred vision and pain.  Respiratory: Negative for cough and shortness of breath.   Cardiovascular: Negative for chest pain and leg swelling.  Gastrointestinal: Negative for abdominal pain, constipation, diarrhea and heartburn.  Genitourinary: Negative for dysuria and frequency.  Musculoskeletal: Negative for falls, joint pain and myalgias.  Neurological: Negative for dizziness, seizures and headaches.  Psychiatric/Behavioral: Negative for depression. The patient is not nervous/anxious and does not have insomnia.     BP 138/80 (BP Location: Right Arm, Patient Position: Sitting, Cuff Size: Normal)   Pulse 80   Temp 98 F (36.7 C) (Oral)   Resp 16   Ht 5\' 1"  (1.549 m)   Wt 127 lb 0.6 oz (57.6 kg)   LMP  02/07/1999 (Approximate)   SpO2 96%   BMI 24.00 kg/m   Physical Exam  Constitutional: She is oriented to person, place, and time. She appears well-developed and well-nourished.  HENT:  Head: Normocephalic and atraumatic.  Right Ear: External ear normal.  Left Ear: External ear normal.  Mouth/Throat: Oropharynx is clear and moist.  Eyes: Conjunctivae are normal. Pupils are equal, round, and reactive to light.  Neck: Normal range of motion. Neck supple. No thyromegaly present.  Cardiovascular: Normal rate, regular rhythm and normal heart sounds.   Pulmonary/Chest: Effort normal and breath sounds normal. No respiratory distress.  Abdominal: Soft. Bowel sounds are normal.  No CVAT  Musculoskeletal: Normal range of motion. She exhibits no edema.  Lymphadenopathy:    She has no cervical adenopathy.  Neurological: She is alert and oriented to person, place, and time.  Gait normal  Skin: Skin is warm and dry.  Psychiatric: She has a normal mood and affect. Her behavior is normal. Thought content normal.  Nursing note and vitals reviewed.  Results for orders placed or performed in visit on 03/02/16  POCT Urinalysis Dipstick  Result Value Ref Range   Color, UA yellow    Clarity, UA clear    Glucose, UA neg    Bilirubin, UA neg    Ketones, UA neg    Spec Grav, UA 1.015    Blood, UA large    pH, UA 7.5    Protein, UA 30    Urobilinogen, UA 0.2    Nitrite, UA neg    Leukocytes, UA Negative Negative   ASSESSMENT/PLAN:   1. Recurrent UTI (urinary tract infection)  - POCT Urinalysis Dipstick - Ambulatory referral to Urology  2. Complete tear of right rotator cuff Scheduled for rot cuff surgery next week  3. Pancreatic cyst Removed.  4. Essential hypertension controlled  5. Hyperlipidemia, unspecified hyperlipidemia type  6. Encounter to establish care with new doctor  7. Hematuria, unspecified type    Patient Instructions  Need records PCP  Drink lots of water  to prevent bladder infections  Call up with your medicines  See me in a month  I have placed a referral to Urology  [facility] here in [place] is associated with [facility]    Raylene Everts, MD

## 2016-03-03 DIAGNOSIS — F329 Major depressive disorder, single episode, unspecified: Secondary | ICD-10-CM | POA: Diagnosis not present

## 2016-03-03 DIAGNOSIS — M75121 Complete rotator cuff tear or rupture of right shoulder, not specified as traumatic: Secondary | ICD-10-CM | POA: Diagnosis not present

## 2016-03-03 DIAGNOSIS — Z881 Allergy status to other antibiotic agents status: Secondary | ICD-10-CM | POA: Diagnosis not present

## 2016-03-03 DIAGNOSIS — M19011 Primary osteoarthritis, right shoulder: Secondary | ICD-10-CM | POA: Diagnosis not present

## 2016-03-03 DIAGNOSIS — Z888 Allergy status to other drugs, medicaments and biological substances status: Secondary | ICD-10-CM | POA: Diagnosis not present

## 2016-03-03 DIAGNOSIS — M75101 Unspecified rotator cuff tear or rupture of right shoulder, not specified as traumatic: Secondary | ICD-10-CM | POA: Diagnosis not present

## 2016-03-03 DIAGNOSIS — Z87442 Personal history of urinary calculi: Secondary | ICD-10-CM | POA: Diagnosis not present

## 2016-03-03 DIAGNOSIS — S46211A Strain of muscle, fascia and tendon of other parts of biceps, right arm, initial encounter: Secondary | ICD-10-CM | POA: Diagnosis not present

## 2016-03-03 DIAGNOSIS — Z8711 Personal history of peptic ulcer disease: Secondary | ICD-10-CM | POA: Diagnosis not present

## 2016-03-03 DIAGNOSIS — M7541 Impingement syndrome of right shoulder: Secondary | ICD-10-CM | POA: Diagnosis not present

## 2016-03-03 DIAGNOSIS — E7800 Pure hypercholesterolemia, unspecified: Secondary | ICD-10-CM | POA: Diagnosis not present

## 2016-03-03 DIAGNOSIS — I10 Essential (primary) hypertension: Secondary | ICD-10-CM | POA: Diagnosis not present

## 2016-03-03 DIAGNOSIS — X58XXXA Exposure to other specified factors, initial encounter: Secondary | ICD-10-CM | POA: Diagnosis not present

## 2016-03-03 DIAGNOSIS — C259 Malignant neoplasm of pancreas, unspecified: Secondary | ICD-10-CM | POA: Diagnosis not present

## 2016-03-03 DIAGNOSIS — G4700 Insomnia, unspecified: Secondary | ICD-10-CM | POA: Diagnosis not present

## 2016-03-13 DIAGNOSIS — M75121 Complete rotator cuff tear or rupture of right shoulder, not specified as traumatic: Secondary | ICD-10-CM | POA: Diagnosis not present

## 2016-03-13 DIAGNOSIS — M25511 Pain in right shoulder: Secondary | ICD-10-CM | POA: Diagnosis not present

## 2016-03-20 DIAGNOSIS — M25511 Pain in right shoulder: Secondary | ICD-10-CM | POA: Diagnosis not present

## 2016-03-20 DIAGNOSIS — Z4789 Encounter for other orthopedic aftercare: Secondary | ICD-10-CM | POA: Diagnosis not present

## 2016-03-22 DIAGNOSIS — Z4789 Encounter for other orthopedic aftercare: Secondary | ICD-10-CM | POA: Diagnosis not present

## 2016-03-22 DIAGNOSIS — M25511 Pain in right shoulder: Secondary | ICD-10-CM | POA: Diagnosis not present

## 2016-03-27 DIAGNOSIS — M25511 Pain in right shoulder: Secondary | ICD-10-CM | POA: Diagnosis not present

## 2016-03-27 DIAGNOSIS — Z4789 Encounter for other orthopedic aftercare: Secondary | ICD-10-CM | POA: Diagnosis not present

## 2016-03-29 DIAGNOSIS — Z4789 Encounter for other orthopedic aftercare: Secondary | ICD-10-CM | POA: Diagnosis not present

## 2016-03-29 DIAGNOSIS — M25511 Pain in right shoulder: Secondary | ICD-10-CM | POA: Diagnosis not present

## 2016-04-03 ENCOUNTER — Ambulatory Visit: Payer: Medicare Other | Admitting: Family Medicine

## 2016-04-03 DIAGNOSIS — Z4789 Encounter for other orthopedic aftercare: Secondary | ICD-10-CM | POA: Diagnosis not present

## 2016-04-03 DIAGNOSIS — M25511 Pain in right shoulder: Secondary | ICD-10-CM | POA: Diagnosis not present

## 2016-04-05 DIAGNOSIS — Z4789 Encounter for other orthopedic aftercare: Secondary | ICD-10-CM | POA: Diagnosis not present

## 2016-04-05 DIAGNOSIS — M25511 Pain in right shoulder: Secondary | ICD-10-CM | POA: Diagnosis not present

## 2016-04-12 DIAGNOSIS — R351 Nocturia: Secondary | ICD-10-CM | POA: Diagnosis not present

## 2016-04-12 DIAGNOSIS — R3121 Asymptomatic microscopic hematuria: Secondary | ICD-10-CM | POA: Diagnosis not present

## 2016-04-12 DIAGNOSIS — R350 Frequency of micturition: Secondary | ICD-10-CM | POA: Diagnosis not present

## 2016-04-18 ENCOUNTER — Ambulatory Visit: Payer: Medicare Other | Admitting: Family Medicine

## 2016-04-19 DIAGNOSIS — M25611 Stiffness of right shoulder, not elsewhere classified: Secondary | ICD-10-CM | POA: Diagnosis not present

## 2016-04-19 DIAGNOSIS — M25511 Pain in right shoulder: Secondary | ICD-10-CM | POA: Diagnosis not present

## 2016-04-21 ENCOUNTER — Ambulatory Visit: Payer: Medicare Other | Admitting: Family Medicine

## 2016-04-24 DIAGNOSIS — M25511 Pain in right shoulder: Secondary | ICD-10-CM | POA: Diagnosis not present

## 2016-04-24 DIAGNOSIS — M25611 Stiffness of right shoulder, not elsewhere classified: Secondary | ICD-10-CM | POA: Diagnosis not present

## 2016-04-25 DIAGNOSIS — M25611 Stiffness of right shoulder, not elsewhere classified: Secondary | ICD-10-CM | POA: Diagnosis not present

## 2016-04-25 DIAGNOSIS — M25511 Pain in right shoulder: Secondary | ICD-10-CM | POA: Diagnosis not present

## 2016-04-28 ENCOUNTER — Encounter: Payer: Self-pay | Admitting: Family Medicine

## 2016-04-28 ENCOUNTER — Ambulatory Visit (INDEPENDENT_AMBULATORY_CARE_PROVIDER_SITE_OTHER): Payer: Medicare Other | Admitting: Family Medicine

## 2016-04-28 VITALS — BP 136/80 | HR 80 | Temp 97.5°F | Resp 16 | Ht 61.0 in | Wt 129.0 lb

## 2016-04-28 DIAGNOSIS — H9202 Otalgia, left ear: Secondary | ICD-10-CM | POA: Diagnosis not present

## 2016-04-28 DIAGNOSIS — E559 Vitamin D deficiency, unspecified: Secondary | ICD-10-CM | POA: Diagnosis not present

## 2016-04-28 DIAGNOSIS — N390 Urinary tract infection, site not specified: Secondary | ICD-10-CM

## 2016-04-28 DIAGNOSIS — I10 Essential (primary) hypertension: Secondary | ICD-10-CM | POA: Diagnosis not present

## 2016-04-28 DIAGNOSIS — E785 Hyperlipidemia, unspecified: Secondary | ICD-10-CM | POA: Diagnosis not present

## 2016-04-28 DIAGNOSIS — M25511 Pain in right shoulder: Secondary | ICD-10-CM | POA: Diagnosis not present

## 2016-04-28 DIAGNOSIS — M25611 Stiffness of right shoulder, not elsewhere classified: Secondary | ICD-10-CM | POA: Diagnosis not present

## 2016-04-28 LAB — COMPLETE METABOLIC PANEL WITH GFR
ALBUMIN: 4.2 g/dL (ref 3.6–5.1)
ALT: 19 U/L (ref 6–29)
AST: 21 U/L (ref 10–35)
Alkaline Phosphatase: 98 U/L (ref 33–130)
BUN: 17 mg/dL (ref 7–25)
CO2: 32 mmol/L — AB (ref 20–31)
Calcium: 9.5 mg/dL (ref 8.6–10.4)
Chloride: 102 mmol/L (ref 98–110)
Creat: 0.9 mg/dL (ref 0.50–0.99)
GFR, Est African American: 77 mL/min (ref >=60)
GFR, Est Non African American: 66 mL/min (ref >=60)
GLUCOSE: 85 mg/dL (ref 65–99)
POTASSIUM: 4.1 mmol/L (ref 3.5–5.3)
SODIUM: 141 mmol/L (ref 135–146)
Total Bilirubin: 0.6 mg/dL (ref 0.2–1.2)
Total Protein: 6.9 g/dL (ref 6.1–8.1)

## 2016-04-28 LAB — LIPID PANEL
Cholesterol: 220 mg/dL — ABNORMAL HIGH (ref <200)
HDL: 70 mg/dL (ref >50)
LDL CALC: 133 mg/dL — AB (ref <100)
Total CHOL/HDL Ratio: 3.1 Ratio (ref <5.0)
Triglycerides: 86 mg/dL (ref <150)
VLDL: 17 mg/dL (ref <30)

## 2016-04-28 LAB — CBC
HEMATOCRIT: 34.9 % — AB (ref 35.0–45.0)
HEMOGLOBIN: 11.4 g/dL — AB (ref 11.7–15.5)
MCH: 31.2 pg (ref 27.0–33.0)
MCHC: 32.7 g/dL (ref 32.0–36.0)
MCV: 95.6 fL (ref 80.0–100.0)
MPV: 9.5 fL (ref 7.5–12.5)
Platelets: 283 10*3/uL (ref 140–400)
RBC: 3.65 MIL/uL — ABNORMAL LOW (ref 3.80–5.10)
RDW: 13.5 % (ref 11.0–15.0)
WBC: 8.6 10*3/uL (ref 3.8–10.8)

## 2016-04-28 NOTE — Patient Instructions (Signed)
You are due for blood testing I will send you a letter with your test results.  If there is anything of concern, we will call right away. I have placed referral to the ear specialist Your blood pressure is great See me in six months You will need flu shot and pneumonia booster then Call sooner for problems

## 2016-04-28 NOTE — Progress Notes (Signed)
Chief Complaint  Patient presents with  . Follow-up   She has rotator cuff surgery in January Is still in PT. I am concerned that she has very little active ROM in her shoulder and will only lift it a few degrees.  I did express to her the importance of participating in PT, and doing home exercises daily to get back her ROM and strength.  She saw the urologist as scheduled and is scheduled for a CT scan of kidneys.  NO urinary infection, frequency or symptoms today.  BP control is good Unable to tolerate statins, is on supplements.  Due for labs.   Patient Active Problem List   Diagnosis Date Noted  . Recurrent UTI (urinary tract infection) 03/02/2016  . Torn rotator cuff 03/02/2016  . Essential hypertension 03/02/2016  . HLD (hyperlipidemia) 03/02/2016  . Hematuria 03/02/2016  . 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 Pancreatic cyst 08/28/2014  . Hepatic cyst 08/28/2014    Outpatient Encounter Prescriptions as of 04/28/2016  Medication Sig  . acetaminophen (TYLENOL) 500 MG tablet Take 1,000 mg by mouth every 6 (six) hours as needed for mild pain.  Marland Kitchen aspirin EC 81 MG tablet Take 81 mg by mouth.  Geronimo Boot XT 300 MG 24 hr capsule Take 300 mg by mouth every morning.   . Multiple Vitamin (MULTIVITAMIN WITH MINERALS) TABS tablet Take 1 tablet by mouth every morning.   . Omega-3 Fatty Acids (FISH OIL) 1000 MG CAPS Take 1 capsule by mouth every morning.   . Potassium 99 MG TABS Take by mouth.  . Red Yeast Rice 600 MG CAPS Take by mouth.   No facility-administered encounter medications on file as of 04/28/2016.     Allergies  Allergen Reactions  . Levaquin [Levofloxacin] Other (See Comments)    "tendon damage"  . Statins Other (See Comments)    "takes the taste out of my mouth    Review of Systems  Constitutional: Negative for activity change, appetite change and unexpected weight change.  HENT: Positive for ear pain. Negative for congestion, dental problem, postnasal drip and  rhinorrhea.        L  Eyes: Negative for redness and visual disturbance.  Respiratory: Negative for cough and shortness of breath.   Cardiovascular: Negative for chest pain, palpitations and leg swelling.  Gastrointestinal: Negative for abdominal pain, constipation and diarrhea.  Genitourinary: Negative for difficulty urinating, frequency and menstrual problem.  Musculoskeletal: Positive for arthralgias. Negative for back pain.  Neurological: Negative for dizziness and headaches.  Psychiatric/Behavioral: Negative for dysphoric mood and sleep disturbance. The patient is not nervous/anxious.     BP 136/80 (BP Location: Left Arm, Patient Position: Sitting, Cuff Size: Normal)   Pulse 80   Temp 97.5 F (36.4 C) (Temporal)   Resp 16   Ht 5\' 1"  (1.549 m)   Wt 129 lb (58.5 kg)   LMP 02/07/1999 (Approximate)   SpO2 96%   BMI 24.37 kg/m   Physical Exam  Constitutional: She is oriented to person, place, and time. She appears well-developed and well-nourished.  HENT:  Head: Normocephalic and atraumatic.  Right Ear: External ear normal.  Left Ear: External ear normal.  Mouth/Throat: Oropharynx is clear and moist.  Left ear clear  Eyes: Conjunctivae are normal. Pupils are equal, round, and reactive to light.  Neck: Normal range of motion. Neck supple. No thyromegaly present.  Cardiovascular: Normal rate, regular rhythm and normal heart sounds.   Pulmonary/Chest: Effort normal and breath sounds normal. No respiratory  distress.  Musculoskeletal: Normal range of motion. She exhibits no edema.  Very  Limited ROM R shoulder  Lymphadenopathy:    She has no cervical adenopathy.  Neurological: She is alert and oriented to person, place, and time.  Gait normal  Skin: Skin is warm and dry.  Psychiatric: She has a normal mood and affect. Her behavior is normal. Thought content normal.  Nursing note and vitals reviewed.   ASSESSMENT/PLAN:  1. Ear pain, left - Ambulatory referral to ENT  2.  Essential hypertension - COMPLETE METABOLIC PANEL WITH GFR - CBC - Lipid panel   3. Recurrent UTI (urinary tract infection) Urology consulted  4. Hyperlipidemia, unspecified hyperlipidemia type Statin intolerant  5. Vitamin D deficiency - VITAMIN D 25 Hydroxy (Vit-D Deficiency, Fractures)   Patient Instructions  You are due for blood testing I will send you a letter with your test results.  If there is anything of concern, we will call right away. I have placed referral to the ear specialist Your blood pressure is great See me in six months You will need flu shot and pneumonia booster then Call sooner for problems    Raylene Everts, MD

## 2016-04-29 LAB — VITAMIN D 25 HYDROXY (VIT D DEFICIENCY, FRACTURES): Vit D, 25-Hydroxy: 43 ng/mL (ref 30–100)

## 2016-05-01 ENCOUNTER — Encounter: Payer: Self-pay | Admitting: Family Medicine

## 2016-05-01 DIAGNOSIS — D649 Anemia, unspecified: Secondary | ICD-10-CM

## 2016-05-09 DIAGNOSIS — Z4789 Encounter for other orthopedic aftercare: Secondary | ICD-10-CM | POA: Diagnosis not present

## 2016-05-09 DIAGNOSIS — M25511 Pain in right shoulder: Secondary | ICD-10-CM | POA: Diagnosis not present

## 2016-05-11 DIAGNOSIS — K7689 Other specified diseases of liver: Secondary | ICD-10-CM | POA: Diagnosis not present

## 2016-05-11 DIAGNOSIS — N2889 Other specified disorders of kidney and ureter: Secondary | ICD-10-CM | POA: Diagnosis not present

## 2016-05-11 DIAGNOSIS — R3121 Asymptomatic microscopic hematuria: Secondary | ICD-10-CM | POA: Diagnosis not present

## 2016-05-11 DIAGNOSIS — R350 Frequency of micturition: Secondary | ICD-10-CM | POA: Diagnosis not present

## 2016-05-12 DIAGNOSIS — Z4789 Encounter for other orthopedic aftercare: Secondary | ICD-10-CM | POA: Diagnosis not present

## 2016-05-12 DIAGNOSIS — M25511 Pain in right shoulder: Secondary | ICD-10-CM | POA: Diagnosis not present

## 2016-05-15 ENCOUNTER — Telehealth: Payer: Self-pay | Admitting: Family Medicine

## 2016-05-15 NOTE — Telephone Encounter (Signed)
Crystal Cooke is calling stating that [facility] stated that it was nothing wrong with her kidneys she needs to be referred to a spine/back Dr. Please advise?

## 2016-05-16 NOTE — Telephone Encounter (Signed)
Mr. Crystal Cooke was referred to a urologist based on her request. She told me she was referred to urology by her previous primary care doctor because of persistent hematuria. She needed a workup for blood in her urine. I'm unaware that she has any spine problems. I'm not going to place any referrals to other specialist until I see her.

## 2016-05-17 DIAGNOSIS — Z4789 Encounter for other orthopedic aftercare: Secondary | ICD-10-CM | POA: Diagnosis not present

## 2016-05-17 DIAGNOSIS — M25511 Pain in right shoulder: Secondary | ICD-10-CM | POA: Diagnosis not present

## 2016-05-18 NOTE — Telephone Encounter (Signed)
Called and spoke to Cadee, appt made for [phone]

## 2016-05-19 DIAGNOSIS — M25511 Pain in right shoulder: Secondary | ICD-10-CM | POA: Diagnosis not present

## 2016-05-19 DIAGNOSIS — Z4789 Encounter for other orthopedic aftercare: Secondary | ICD-10-CM | POA: Diagnosis not present

## 2016-05-22 DIAGNOSIS — M25511 Pain in right shoulder: Secondary | ICD-10-CM | POA: Diagnosis not present

## 2016-05-22 DIAGNOSIS — Z4789 Encounter for other orthopedic aftercare: Secondary | ICD-10-CM | POA: Diagnosis not present

## 2016-05-23 ENCOUNTER — Ambulatory Visit: Payer: Medicare Other | Admitting: Family Medicine

## 2016-05-25 ENCOUNTER — Ambulatory Visit: Payer: Medicare Other | Admitting: Family Medicine

## 2016-05-26 DIAGNOSIS — M25511 Pain in right shoulder: Secondary | ICD-10-CM | POA: Diagnosis not present

## 2016-05-26 DIAGNOSIS — Z4789 Encounter for other orthopedic aftercare: Secondary | ICD-10-CM | POA: Diagnosis not present

## 2016-05-29 DIAGNOSIS — Z4789 Encounter for other orthopedic aftercare: Secondary | ICD-10-CM | POA: Diagnosis not present

## 2016-05-29 DIAGNOSIS — M25511 Pain in right shoulder: Secondary | ICD-10-CM | POA: Diagnosis not present

## 2016-05-30 ENCOUNTER — Ambulatory Visit: Payer: Medicare Other | Admitting: Family Medicine

## 2016-05-31 DIAGNOSIS — M7501 Adhesive capsulitis of right shoulder: Secondary | ICD-10-CM | POA: Diagnosis not present

## 2016-05-31 DIAGNOSIS — S83242A Other tear of medial meniscus, current injury, left knee, initial encounter: Secondary | ICD-10-CM | POA: Diagnosis not present

## 2016-05-31 DIAGNOSIS — M25562 Pain in left knee: Secondary | ICD-10-CM | POA: Diagnosis not present

## 2016-05-31 DIAGNOSIS — M25511 Pain in right shoulder: Secondary | ICD-10-CM | POA: Diagnosis not present

## 2016-06-01 ENCOUNTER — Encounter: Payer: Self-pay | Admitting: Family Medicine

## 2016-06-01 ENCOUNTER — Ambulatory Visit (INDEPENDENT_AMBULATORY_CARE_PROVIDER_SITE_OTHER): Payer: Medicare Other | Admitting: Family Medicine

## 2016-06-01 VITALS — BP 126/80 | HR 88 | Temp 97.0°F | Resp 16 | Ht 61.0 in | Wt 131.0 lb

## 2016-06-01 DIAGNOSIS — M4316 Spondylolisthesis, lumbar region: Secondary | ICD-10-CM

## 2016-06-01 DIAGNOSIS — M4726 Other spondylosis with radiculopathy, lumbar region: Secondary | ICD-10-CM

## 2016-06-01 DIAGNOSIS — M5432 Sciatica, left side: Secondary | ICD-10-CM | POA: Diagnosis not present

## 2016-06-01 MED ORDER — ALPRAZOLAM 0.5 MG PO TABS: 0.5000 mg | ORAL_TABLET | Freq: Every evening | ORAL | 0 refills | Status: DC | PRN

## 2016-06-01 MED ORDER — CARTIA XT 300 MG PO CP24: 300.0000 mg | ORAL_CAPSULE | Freq: Every morning | ORAL | 3 refills | Status: DC

## 2016-06-01 NOTE — Progress Notes (Signed)
Chief Complaint  Patient presents with  . Referral   Patient is here for acute visit. She wants a referral to a spine specialist. She states that she has had back pain off and on for years. Her last set of back x-rays were in 2014. Recently she's been having low back pain and pain that radiates down the left leg to the foot. She gets tingling in her toes. This is been present for more than a month. No persistent numbness. No muscular weakness. No bowel or bladder complaint. She recently completed a workup with urology for hematuria, it was negative. She recently saw her orthopedic surgeon for pain in her right shoulder and right knee. She got a cortisone shot in each area. Her back pain is gradual. Her severity is moderate although does diminish her activity level. It is worse with activity and better with rest. She has tried over-the-counter medicines with no relief. She has not had any fall injury or overuse.  Patient Active Problem List   Diagnosis Date Noted  . Recurrent UTI (urinary tract infection) 03/02/2016  . Torn rotator cuff 03/02/2016  . Essential hypertension 03/02/2016  . HLD (hyperlipidemia) 03/02/2016  . Hematuria 03/02/2016  . 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 Pancreatic cyst 08/28/2014  . Hepatic cyst 08/28/2014    Outpatient Encounter Prescriptions as of 06/01/2016  Medication Sig  . acetaminophen (TYLENOL) 500 MG tablet Take 1,000 mg by mouth every 6 (six) hours as needed for mild pain.  Marland Kitchen aspirin EC 81 MG tablet Take 81 mg by mouth.  Geronimo Boot XT 300 MG 24 hr capsule Take 1 capsule (300 mg total) by mouth every morning.  . Multiple Vitamin (MULTIVITAMIN WITH MINERALS) TABS tablet Take 1 tablet by mouth every morning.   . Omega-3 Fatty Acids (FISH OIL) 1000 MG CAPS Take 1 capsule by mouth every morning.   . Potassium 99 MG TABS Take by mouth.  . Red Yeast Rice 600 MG CAPS Take by mouth.  . [DISCONTINUED] CARTIA XT 300 MG 24 hr capsule Take 300 mg by mouth every  morning.   Marland Kitchen ALPRAZolam (XANAX) 0.5 MG tablet Take 1 tablet (0.5 mg total) by mouth at bedtime as needed for anxiety.   No facility-administered encounter medications on file as of 06/01/2016.     Allergies  Allergen Reactions  . Levaquin [Levofloxacin] Other (See Comments)    "tendon damage"  . Statins Other (See Comments)    "takes the taste out of my mouth    Review of Systems  Constitutional: Negative for activity change, appetite change and unexpected weight change.  HENT: Negative for congestion, dental problem, postnasal drip and rhinorrhea.   Eyes: Negative for redness and visual disturbance.  Respiratory: Negative for cough and shortness of breath.   Cardiovascular: Negative for chest pain, palpitations and leg swelling.  Gastrointestinal: Negative for abdominal pain, constipation and diarrhea.  Genitourinary: Negative for difficulty urinating, frequency and menstrual problem.  Musculoskeletal: Positive for back pain. Negative for arthralgias.  Neurological: Positive for numbness. Negative for dizziness and headaches.  Psychiatric/Behavioral: Negative for dysphoric mood and sleep disturbance. The patient is nervous/anxious.     BP 126/80 (BP Location: Right Arm, Patient Position: Sitting, Cuff Size: Normal)   Pulse 88   Temp 97 F (36.1 C) (Temporal)   Resp 16   Ht 5\' 1"  (1.549 m)   Wt 131 lb (59.4 kg)   LMP 02/07/1999 (Approximate)   SpO2 98%   BMI 24.75 kg/m   Physical  Exam  Constitutional: She is oriented to person, place, and time. She appears well-developed and well-nourished.  HENT:  Head: Normocephalic and atraumatic.  Right Ear: External ear normal.  Left Ear: External ear normal.  Mouth/Throat: Oropharynx is clear and moist.  Eyes: Conjunctivae are normal. Pupils are equal, round, and reactive to light.  Neck: Normal range of motion. Neck supple. No thyromegaly present.  Cardiovascular: Normal rate, regular rhythm and normal heart sounds.     Pulmonary/Chest: Effort normal and breath sounds normal. No respiratory distress.  Abdominal: Soft. Bowel sounds are normal.  Musculoskeletal: Normal range of motion. She exhibits no edema.  Lumbar spine is straight and symmetric. Full range of motion. No tenderness or muscle spasm. Strength, sensation, range of motion, and reflexes are largely normal in both lower extremities. Straight leg raise is negative on the right. On the left increases her back and pain to the calf. She has questionable extensor weakness in the left ankle.   Lymphadenopathy:    She has no cervical adenopathy.  Neurological: She is alert and oriented to person, place, and time.  Gait normal  Skin: Skin is warm and dry.  Psychiatric: She has a normal mood and affect. Her behavior is normal. Thought content normal.  Nursing note and vitals reviewed.  Old x-rays are reviewed. The patient does have 3 mm of spondylolisthesis at the L4-5 level with disc space narrowing on the films 2014. These are shown to the patient. She has known abnormal x-ray. She is in good health and in good physical condition, exercises regularly. I do not believe she would improve with physical therapy. Symptoms persisted for months. She is not responding with over-the-counter medications. In order to see a spine specialist, I believe they will require an MRI of her spine. This is ordered.  ASSESSMENT/PLAN:  1. Spondylolisthesis of lumbar region  - MR Lumbar Spine Wo Contrast; Future - DG Lumbar Spine Complete; Future  2. Osteoarthritis of spine with radiculopathy, lumbar region  - MR Lumbar Spine Wo Contrast; Future - DG Lumbar Spine Complete; Future  3. Sciatica, left side  - MR Lumbar Spine Wo Contrast; Future - DG Lumbar Spine Complete; Future   Patient Instructions  Medicine refilled  I ordered x ray and MRI of low back  Will refer spine specialist after testing    Raylene Everts, MD

## 2016-06-01 NOTE — Patient Instructions (Signed)
Medicine refilled  I ordered x ray and MRI of low back  Will refer spine specialist after testing

## 2016-06-06 ENCOUNTER — Ambulatory Visit (HOSPITAL_COMMUNITY)
Admission: RE | Admit: 2016-06-06 | Discharge: 2016-06-06 | Disposition: A | Discharge status: Home / Self Care | Payer: Medicare Other | Source: Ambulatory Visit | Attending: Family Medicine | Admitting: Family Medicine

## 2016-06-06 ENCOUNTER — Other Ambulatory Visit: Payer: Self-pay | Admitting: Family Medicine

## 2016-06-06 ENCOUNTER — Encounter: Payer: Self-pay | Admitting: Family Medicine

## 2016-06-06 DIAGNOSIS — M4316 Spondylolisthesis, lumbar region: Secondary | ICD-10-CM

## 2016-06-06 DIAGNOSIS — K7689 Other specified diseases of liver: Secondary | ICD-10-CM | POA: Insufficient documentation

## 2016-06-06 DIAGNOSIS — M48061 Spinal stenosis, lumbar region without neurogenic claudication: Secondary | ICD-10-CM | POA: Insufficient documentation

## 2016-06-06 DIAGNOSIS — M4726 Other spondylosis with radiculopathy, lumbar region: Secondary | ICD-10-CM

## 2016-06-06 DIAGNOSIS — M5432 Sciatica, left side: Secondary | ICD-10-CM | POA: Diagnosis not present

## 2016-06-06 DIAGNOSIS — N281 Cyst of kidney, acquired: Secondary | ICD-10-CM | POA: Insufficient documentation

## 2016-06-06 DIAGNOSIS — G8929 Other chronic pain: Secondary | ICD-10-CM

## 2016-06-06 DIAGNOSIS — M5442 Lumbago with sciatica, left side: Secondary | ICD-10-CM

## 2016-06-06 DIAGNOSIS — M545 Low back pain: Secondary | ICD-10-CM | POA: Diagnosis not present

## 2016-06-09 DIAGNOSIS — Z4789 Encounter for other orthopedic aftercare: Secondary | ICD-10-CM | POA: Diagnosis not present

## 2016-06-09 DIAGNOSIS — M25511 Pain in right shoulder: Secondary | ICD-10-CM | POA: Diagnosis not present

## 2016-06-12 ENCOUNTER — Ambulatory Visit (INDEPENDENT_AMBULATORY_CARE_PROVIDER_SITE_OTHER): Payer: Medicare Other | Admitting: Otolaryngology

## 2016-06-12 DIAGNOSIS — Z4789 Encounter for other orthopedic aftercare: Secondary | ICD-10-CM | POA: Diagnosis not present

## 2016-06-12 DIAGNOSIS — M25511 Pain in right shoulder: Secondary | ICD-10-CM | POA: Diagnosis not present

## 2016-06-19 DIAGNOSIS — Z4789 Encounter for other orthopedic aftercare: Secondary | ICD-10-CM | POA: Diagnosis not present

## 2016-06-19 DIAGNOSIS — M25511 Pain in right shoulder: Secondary | ICD-10-CM | POA: Diagnosis not present

## 2016-06-21 DIAGNOSIS — M25511 Pain in right shoulder: Secondary | ICD-10-CM | POA: Diagnosis not present

## 2016-06-21 DIAGNOSIS — Z4789 Encounter for other orthopedic aftercare: Secondary | ICD-10-CM | POA: Diagnosis not present

## 2016-06-22 ENCOUNTER — Ambulatory Visit (INDEPENDENT_AMBULATORY_CARE_PROVIDER_SITE_OTHER): Payer: Medicare Other | Admitting: Orthopaedic Surgery

## 2016-06-22 ENCOUNTER — Encounter (INDEPENDENT_AMBULATORY_CARE_PROVIDER_SITE_OTHER): Payer: Self-pay | Admitting: Orthopaedic Surgery

## 2016-06-22 VITALS — BP 123/83 | HR 89 | Ht 62.0 in | Wt 130.0 lb

## 2016-06-22 DIAGNOSIS — M4726 Other spondylosis with radiculopathy, lumbar region: Secondary | ICD-10-CM | POA: Diagnosis not present

## 2016-06-22 NOTE — Progress Notes (Signed)
Office Visit Note   Patient: Crystal Cooke           Date of Birth: April 08, 1950           MRN: [id number] Visit Date: 06/22/2016              Requested by: Raylene Everts, MD [phone] [place], [place] [address] PCP: Raylene Everts, MD   Assessment & Plan: Visit Diagnoses:  1. Other spondylosis with radiculopathy, lumbar region          With multilevel degenerative anterolisthesis due to degenerative facets with foraminal narrowing.  Plan: Two-level lumbar foraminal ESI with Dr. Ernestina Patches. Patient has a narrowing at 67 anterolisthesis 3 mm. Degenerative changes with listhesis at 3-4 and 4-5. We'll have her see Dr. Ernestina Patches for injections on her left symptomatic side. This could be facet mediated but appears to be more radicular symptoms related to facet degeneration and anterolisthesis with lateral recess narrowing. Follow-Up Instructions: Office follow-up after epidurals.  Orders:  No orders of the defined types were placed in this encounter.  No orders of the defined types were placed in this encounter.     Procedures: No procedures performed   Clinical Data: No additional findings.   Subjective: Chief Complaint  Patient presents with  . Lower Back - Pain    HPI 66-year-old female seen with chronic low back pain radiating to her left leg with tingling down to her toes. She said symptoms for 6-7 months. She been treated with ibuprofen, Tylenol. She's had oxycodone and hydrocodone but states she doesn't take them. No previous history of lumbar surgery. She denies chills or fever. Negative for falls. Patient is a widow. No history of cancer.  Review of Systems 14 point review of systems positive for a history of anxiety arthritis and also hypertension. Previous pancreatic cyst, hepatic cyst recurrent UTIs, hypertension hyperlipidemia. Otherwise negative as pertains or history of present illness.   Objective: Vital Signs: BP 123/83   Pulse 89   Ht 5\' 2"   (1.575 m)   Wt 130 lb (59 kg)   LMP 02/07/1999 (Approximate)   BMI 23.78 kg/m   Physical Exam  Constitutional: She is oriented to person, place, and time. She appears well-developed.  HENT:  Head: Normocephalic.  Right Ear: External ear normal.  Left Ear: External ear normal.  Eyes: Pupils are equal, round, and reactive to light.  Neck: No tracheal deviation present. No thyromegaly present.  Cardiovascular: Normal rate.   Pulmonary/Chest: Effort normal.  Abdominal: Soft.  Neurological: She is alert and oriented to person, place, and time.  Skin: Skin is warm and dry.  Psychiatric: She has a normal mood and affect. Her behavior is normal.    Ortho Exam pelvis is level lower extremity reflexes are 2+ symmetrical. She has some sciatic notch tenderness more on the left than right. Distal pulses are palpable anterior tib EHL gastrocsoleus is strong. No venous stasis changes. No plantar foot lesions.  Specialty Comments:  No specialty comments available.  Imaging:Study Result   CLINICAL DATA:  Low back pain radiating to the left hip over the last 2 months.  EXAM: MRI LUMBAR SPINE WITHOUT CONTRAST  TECHNIQUE: Multiplanar, multisequence MR imaging of the lumbar spine was performed. No intravenous contrast was administered.  COMPARISON:  CT 05/11/2016.  Radiography 09/22/2012  FINDINGS: Segmentation:  L5 is a transitional vertebra.  Alignment: Exaggerated lower lumbar lordosis. 1 mm anterolisthesis L3-4. 3 mm anterolisthesis L4-5. 2 mm anterolisthesis L5-S1.  Vertebrae:  No fracture or primary bone lesion.  Conus medullaris: Extends to the L1 level and appears normal.  Paraspinal and other soft tissues: Hepatic and renal cysts.  Disc levels:  No abnormality at T11-12, T12-L1 or L1-2.  L2-3: Minimal bulging of the disc. Minimal facet and ligamentous hypertrophy. No compressive stenosis.  L3-4: Anterolisthesis of 1 mm. Bulging of the disc. Bilateral  facet degeneration and hypertrophy. Stenosis of both lateral recesses without definite neural compression. Foraminal stenosis right more than left.  L4-5: Anterolisthesis of 3 mm. Disc degeneration with mild bulging. Bilateral facet arthropathy. Stenosis of both lateral recesses left worse than right. Foraminal stenosis bilaterally.  L5-S1: Transitional level. Anterolisthesis of 2 mm. Minimal bulging of the disc. Mild facet hypertrophy. No compressive stenosis.  IMPRESSION: Transitional L5. Exaggerated lower lumbar lordosis with lower lumbar facet arthropathy. Mild anterolisthesis at L3-4, L4-5 and L5-S1. Bulging of the lumbar discs. The facet arthropathy could be a cause of low back pain or referred facet syndrome pain. Some potential for neural compression in the lateral recesses and foramina at L3-4 and L4-5. This is most pronounced on the left at L4-5.   Electronically Signed   By: Nelson Chimes M.D.   On: 06/06/2016 08:31        PMFS History: Patient Active Problem List   Diagnosis Date Noted  . Recurrent UTI (urinary tract infection) 03/02/2016  . Torn rotator cuff 03/02/2016  . Essential hypertension 03/02/2016  . HLD (hyperlipidemia) 03/02/2016  . Hematuria 03/02/2016  . 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 Pancreatic cyst 08/28/2014  . Hepatic cyst 08/28/2014   Past Medical History:  Diagnosis Date  . Allergy   . Anxiety   . Atrial fibrillation ([place])    ??? has never seen cardiologist per patient  . DDD (degenerative disc disease), lumbar   . Hypercholesteremia   . Hypertension     Family History  Problem Relation Age of Onset  . Lung cancer Mother           . Hypertension Mother   . Cancer Mother   . Diabetes Father   . Hypertension Father   . Lung cancer Father        deceased age 91  . Cancer Father   . Colon cancer Sister        deceased May 19, 2014, age 34  . Pancreatic cancer Neg Hx     Past Surgical History:  Procedure Laterality Date  .  COLONOSCOPY  05/05/14   Dr.Benson- normal colonoscopy, retroflexed views revealed no abnormalities.   . ESOPHAGOGASTRODUODENOSCOPY  05/05/14   Dr.Benson- normal EGD, retroflexed views revealed no abnormalities   . EUS N/A 09/24/2014   Procedure: UPPER ENDOSCOPIC ULTRASOUND (EUS) RADIAL;  Surgeon: Milus Banister, MD;  Location: [facility];  Service: Endoscopy;  Laterality: N/A;  . none    . 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 Smoking status: Never Smoker  . Smokeless tobacco: Never Used  . Alcohol use No  . Drug use: No  . Sexual activity: Not Currently

## 2016-07-06 ENCOUNTER — Ambulatory Visit (INDEPENDENT_AMBULATORY_CARE_PROVIDER_SITE_OTHER): Payer: Medicare Other

## 2016-07-06 ENCOUNTER — Encounter (INDEPENDENT_AMBULATORY_CARE_PROVIDER_SITE_OTHER): Payer: Self-pay

## 2016-07-06 ENCOUNTER — Ambulatory Visit (INDEPENDENT_AMBULATORY_CARE_PROVIDER_SITE_OTHER): Payer: Medicare Other | Admitting: Physical Medicine and Rehabilitation

## 2016-07-06 ENCOUNTER — Encounter (INDEPENDENT_AMBULATORY_CARE_PROVIDER_SITE_OTHER): Payer: Self-pay | Admitting: Physical Medicine and Rehabilitation

## 2016-07-06 VITALS — BP 127/78 | HR 83

## 2016-07-06 DIAGNOSIS — M5416 Radiculopathy, lumbar region: Secondary | ICD-10-CM

## 2016-07-06 DIAGNOSIS — M5116 Intervertebral disc disorders with radiculopathy, lumbar region: Secondary | ICD-10-CM

## 2016-07-06 MED ORDER — METHYLPREDNISOLONE ACETATE 80 MG/ML IJ SUSP
80.0000 mg | Freq: Once | INTRAMUSCULAR | Status: AC
  Administered 2016-07-06: 80 mg

## 2016-07-06 MED ORDER — LIDOCAINE HCL (PF) 1 % IJ SOLN
2.0000 mL | Freq: Once | INTRAMUSCULAR | Status: AC
  Administered 2016-07-06: 2 mL

## 2016-07-06 NOTE — Progress Notes (Deleted)
Pain across lower back and worse on left side. Numbness and pain down leg to foot. Constant pain. Worse with bending and being in any one position for too long.

## 2016-07-06 NOTE — Patient Instructions (Signed)

## 2016-07-10 NOTE — Procedures (Signed)
Lumbosacral Transforaminal Epidural Steroid Injection - Infraneural Approach with Fluoroscopic Guidance  Patient: Crystal Cooke      Date of Birth: 04-19-1950 MRN: [id number] PCP: Raylene Everts, MD      Visit Date: 07/06/2016  Mrs. Mabin is a very pleasant 66 year old female followed by Dr. Lorin Mercy who requests a diagnostic and hopefully therapeutic left L4  And L5 transforaminal epidural steroid injection. The patient has had an MRI showing small listhesis and lateral recess narrowing or tingling on the left at L3 Devashwar L4-5. She does have a transitional sacralized L5 segment. Based on that numbering scheme we are going to complete the injection as noted above.  Universal Protocol:     Consent Given By: the patient  Position: PRONE   Additional Comments: Vital signs were monitored before and after the procedure. Patient was prepped and draped in the usual sterile fashion. The correct patient, procedure, and site was verified.   Injection Procedure Details:  Procedure Site One Meds Administered:  Meds ordered this encounter  Medications  . lidocaine (PF) (XYLOCAINE) 1 % injection 2 mL  . methylPREDNISolone acetate (DEPO-MEDROL) injection 80 mg      Laterality: Left  Location/Site:  L4-L5 L5-S1  Needle size: 22 G  Needle type: Spinal  Needle Placement: Transforaminal  Findings:  -Contrast Used: 1 mL iohexol 180 mg iodine/mL   -Comments: Excellent flow of contrast along the nerve and into the epidural space.  Procedure Details: After squaring off the end-plates of the desired vertebral level to get a true AP view, the C-arm was obliqued to the painful side so that the superior articulating process is positioned about 1/3 the length of the inferior endplate.  The needle was aimed toward the junction of the superior articular process and the transverse process of the inferior vertebrae. The needle's initial entry is in the lower third of the foramen through Kambin's  triangle. The soft tissues overlying this target were infiltrated with 2-3 ml. of 1% Lidocaine without Epinephrine.  The spinal needle was then inserted and advanced toward the target using a "trajectory" view along the fluoroscope beam.  Under AP and lateral visualization, the needle was advanced so it did not puncture dura and did not traverse medially beyond the 6 o'clock position of the pedicle. Bi-planar projections were used to confirm position. Aspiration was confirmed to be negative for CSF and/or blood. A 1-2 ml. volume of Isovue-250 was injected and flow of contrast was noted at each level. Radiographs were obtained for documentation purposes.   After attaining the desired flow of contrast documented above, a 0.5 to 1.0 ml test dose of 0.25% Marcaine was injected into each respective transforaminal space.  The patient was observed for 90 seconds post injection.  After no sensory deficits were reported, and normal lower extremity motor function was noted,   the above injectate was administered so that equal amounts of the injectate were placed at each foramen (level) into the transforaminal epidural space.   Additional Comments:  The patient tolerated the procedure well Dressing: Band-Aid    Post-procedure details: Patient was observed during the procedure. Post-procedure instructions were reviewed.  Patient left the clinic in stable condition.

## 2016-07-11 ENCOUNTER — Ambulatory Visit: Payer: Medicare Other | Admitting: Family Medicine

## 2016-07-20 ENCOUNTER — Ambulatory Visit (INDEPENDENT_AMBULATORY_CARE_PROVIDER_SITE_OTHER): Payer: Medicare Other | Admitting: Orthopaedic Surgery

## 2016-07-31 ENCOUNTER — Ambulatory Visit (INDEPENDENT_AMBULATORY_CARE_PROVIDER_SITE_OTHER): Payer: Medicare Other | Admitting: Otolaryngology

## 2016-08-02 DIAGNOSIS — M7989 Other specified soft tissue disorders: Secondary | ICD-10-CM | POA: Diagnosis not present

## 2016-08-02 DIAGNOSIS — M25561 Pain in right knee: Secondary | ICD-10-CM | POA: Diagnosis not present

## 2016-08-02 DIAGNOSIS — Z79899 Other long term (current) drug therapy: Secondary | ICD-10-CM | POA: Diagnosis not present

## 2016-08-02 DIAGNOSIS — M549 Dorsalgia, unspecified: Secondary | ICD-10-CM | POA: Diagnosis not present

## 2016-08-02 DIAGNOSIS — I10 Essential (primary) hypertension: Secondary | ICD-10-CM | POA: Diagnosis not present

## 2016-08-02 DIAGNOSIS — M11261 Other chondrocalcinosis, right knee: Secondary | ICD-10-CM | POA: Diagnosis not present

## 2016-08-02 DIAGNOSIS — G8929 Other chronic pain: Secondary | ICD-10-CM | POA: Diagnosis not present

## 2016-08-02 DIAGNOSIS — E7800 Pure hypercholesterolemia, unspecified: Secondary | ICD-10-CM | POA: Diagnosis not present

## 2016-08-03 ENCOUNTER — Encounter: Payer: Self-pay | Admitting: Family Medicine

## 2016-08-03 ENCOUNTER — Encounter: Payer: Self-pay | Admitting: *Deleted

## 2016-08-03 ENCOUNTER — Telehealth: Payer: Self-pay | Admitting: Family Medicine

## 2016-08-03 NOTE — Telephone Encounter (Signed)
Called Crystal Cooke, given dr Thera Flake advice, in agreement, will call next week if not better.

## 2016-08-03 NOTE — Telephone Encounter (Signed)
I reviewed the ER notes, x rays and treatment.  The shot they gave her should help the fluid resolve on its own.  Draining will not likely be needed for the small amount of fluid seen.  Call if not improving in a week

## 2016-08-03 NOTE — Telephone Encounter (Signed)
Patient seen yesterday at [facility] for fluid on the knees that started swelling 4 days ago. She is requesting appt tomorrow or Monday.   She was told to follow up with PCP if the knees needed "draining".

## 2016-08-03 NOTE — Telephone Encounter (Signed)
Need records please.

## 2016-08-16 ENCOUNTER — Encounter (INDEPENDENT_AMBULATORY_CARE_PROVIDER_SITE_OTHER): Payer: Self-pay | Admitting: Orthopaedic Surgery

## 2016-08-16 ENCOUNTER — Ambulatory Visit (INDEPENDENT_AMBULATORY_CARE_PROVIDER_SITE_OTHER): Payer: Medicare Other

## 2016-08-16 ENCOUNTER — Ambulatory Visit (INDEPENDENT_AMBULATORY_CARE_PROVIDER_SITE_OTHER): Payer: Medicare Other | Admitting: Orthopaedic Surgery

## 2016-08-16 VITALS — BP 146/91 | HR 86

## 2016-08-16 DIAGNOSIS — M25561 Pain in right knee: Secondary | ICD-10-CM

## 2016-08-16 DIAGNOSIS — G8929 Other chronic pain: Secondary | ICD-10-CM

## 2016-08-16 DIAGNOSIS — M533 Sacrococcygeal disorders, not elsewhere classified: Secondary | ICD-10-CM

## 2016-08-16 DIAGNOSIS — M4726 Other spondylosis with radiculopathy, lumbar region: Secondary | ICD-10-CM | POA: Diagnosis not present

## 2016-08-16 DIAGNOSIS — M11261 Other chondrocalcinosis, right knee: Secondary | ICD-10-CM

## 2016-08-16 MED ORDER — BUPIVACAINE HCL 0.25 % IJ SOLN
6.0000 mL | INTRAMUSCULAR | Status: AC | PRN
  Administered 2016-08-16: 6 mL via INTRA_ARTICULAR

## 2016-08-16 MED ORDER — METHYLPREDNISOLONE ACETATE 40 MG/ML IJ SUSP
80.0000 mg | INTRAMUSCULAR | Status: AC | PRN
  Administered 2016-08-16: 80 mg

## 2016-08-16 MED ORDER — LIDOCAINE HCL 1 % IJ SOLN
3.0000 mL | INTRAMUSCULAR | Status: AC | PRN
  Administered 2016-08-16: 3 mL

## 2016-08-16 NOTE — Progress Notes (Signed)
Office Visit Note   Patient: Crystal Cooke           Date of Birth: Dec 22, 1950           MRN: [id number] Visit Date: 08/16/2016              Requested by: Raylene Everts, MD [phone] [place], [place] [address] PCP: Raylene Everts, MD   Assessment & Plan: Visit Diagnoses:  1. Chronic pain of right knee   2. Chondrocalcinosis of right knee   3. Other spondylosis with radiculopathy, lumbar region   4. Chronic left SI joint pain     Plan: Advised patient that ultimately it may come down her needing multilevel lumbar fusion present previously discussed with Dr. Lorin Mercy. She again voices that she wants to hold off on doing surgery at this time. The try to help give her some relief of her left-sided low back pain I recommend trying a left SI joint diagnostic/therapeutic Marcaine/Depo-Medrol injection. Advised her to pay close attention to how she feels after having the injection  Regards to her knee pain offered Marcaine/Depo-Medrol injection there. After patient consent right knee was prepped and injection was completed. She is on long-term relief with today's knee injection we will schedule MRI to rule out meniscus tear and to better evaluate the extent of her chondromalacia. All questions answered.  Follow-Up Instructions: Return in about 3 weeks (around 09/06/2016).   Orders:  Orders Placed This Encounter  Procedures  . Large Joint Injection/Arthrocentesis  . XR KNEE 3 VIEW RIGHT  . Ambulatory referral to Physical Medicine Rehab   No orders of the defined types were placed in this encounter.     Procedures: Large Joint Inj Date/Time: 08/16/2016 10:23 AM Performed by: Lanae Crumbly Authorized by: Lanae Crumbly   Consent Given by:  Patient Timeout: prior to procedure the correct patient, procedure, and site was verified   Location:  Knee Site:  R knee Needle Size:  25 G Needle Length:  1.5 inches Approach:  Anterolateral Ultrasound Guidance: No     Fluoroscopic Guidance: No   Arthrogram: No   Medications:  3 mL lidocaine 1 %; 80 mg methylPREDNISolone acetate 40 MG/ML; 6 mL bupivacaine 0.25 % Aspiration Attempted: No       Clinical Data: No additional findings.   Subjective: Chief Complaint  Patient presents with  . Lower Back - Pain  . Right Knee - Pain    HPI Patient returns after having left L4-5 and L5-S1 transforaminal ESI's with Dr. Ernestina Patches. States that these did not relieve any of her pain and actually made it worse During the injections.  Continues to describe having ongoing low back pain and left lower extremity radicular symptoms. Patient states that she is not interested in having lumbar fusion that has been previously discussed as a treatment option. She has there is anything else that can help alleviate her back pain.. Patient does localize most of her pain being centered around the left SI joint.  Pain in this area with sitting and ambulating. She also brings up the issue with right knee pain that has been ongoing 2 weeks. No previous films for onset. Denies injury. Localizes pain to the joint line with some feeling of catching. She did go to the emergency room due to pain and swelling and was given a Depo-Medrol IM injection that did not help. pain with ambulating and squatting. Review of Systems  Constitutional: Positive for activity change.  HENT: Negative.   Respiratory: Negative.   Gastrointestinal: Negative.   Musculoskeletal: Positive for back pain and joint swelling (Right knee ).  Neurological: Positive for numbness.  Psychiatric/Behavioral: Negative.      Objective: Vital Signs: BP (!) 146/91   Pulse 86   LMP 02/07/1999 (Approximate)   Physical Exam  Constitutional: She is oriented to person, place, and time. She appears well-developed and well-nourished. No distress.  HENT:  Head: Normocephalic and atraumatic.  Eyes: EOM are normal. Pupils are equal, round, and reactive to light.  Neck: Normal  range of motion.  Pulmonary/Chest: Effort normal. No respiratory distress.  Musculoskeletal:  Gait is somewhat antalgic. Negative logroll bilateral hips. Negative straight leg raise. Right knee range of motion about 0-150+ degrees. Does have some swelling without large effusion. Positive patellofemoral crepitus. Joint line tender. Positive McMurray's test. Cruciate and collateral ligaments are stable.. Moderate to marked tenderness over the left SI joint.. Lumbar paraspinal tenderness.  Neurological: She is alert and oriented to person, place, and time.  Skin: Skin is warm and dry.    Ortho Exam  Specialty Comments:  No specialty comments available.  Imaging: Xr Knee 3 View Right  Result Date: 08/16/2016 X-ray right knee shows tricompartmental narrowing. Lateral compartment chondrocalcinosis. No acute findings.    PMFS History: Patient Active Problem List   Diagnosis Date Noted  . Recurrent UTI (urinary tract infection) 03/02/2016  . Torn rotator cuff 03/02/2016  . Essential hypertension 03/02/2016  . HLD (hyperlipidemia) 03/02/2016  . Hematuria 03/02/2016  . 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 Pancreatic cyst 08/28/2014  . Hepatic cyst 08/28/2014   Past Medical History:  Diagnosis Date  . Allergy   . Anxiety   . Atrial fibrillation ([place])    ??? has never seen cardiologist per patient  . DDD (degenerative disc disease), lumbar   . Hypercholesteremia   . Hypertension     Family History  Problem Relation Age of Onset  . Lung cancer Mother           . Hypertension Mother   . Cancer Mother   . Diabetes Father   . Hypertension Father   . Lung cancer Father        deceased age 43  . Cancer Father   . Colon cancer Sister        deceased 2014/05/13, age 16  . Pancreatic cancer Neg Hx     Past Surgical History:  Procedure Laterality Date  . COLONOSCOPY  05/05/14   Dr.Benson- normal colonoscopy, retroflexed views revealed no abnormalities.   . ESOPHAGOGASTRODUODENOSCOPY  05/05/14    Dr.Benson- normal EGD, retroflexed views revealed no abnormalities   . EUS N/A 09/24/2014   Procedure: UPPER ENDOSCOPIC ULTRASOUND (EUS) RADIAL;  Surgeon: Milus Banister, MD;  Location: [facility];  Service: Endoscopy;  Laterality: N/A;  . none    . 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 Smoking status: Never Smoker  . Smokeless tobacco: Never Used  . Alcohol use No  . Drug use: No  . Sexual activity: Not Currently

## 2016-08-17 ENCOUNTER — Ambulatory Visit: Payer: Medicare Other | Admitting: Family Medicine

## 2016-08-17 ENCOUNTER — Telehealth: Payer: Self-pay | Admitting: Family Medicine

## 2016-08-17 NOTE — Telephone Encounter (Signed)
Noted  

## 2016-08-17 NOTE — Telephone Encounter (Signed)
FYI: Patient cancelled her appt at 11am w/Dr Meda Coffee today since she was seen by Dr. Lorin Mercy for her knee.  She had left a message on voice mail yesterday 08-16-16 at 3:50.

## 2016-09-04 ENCOUNTER — Ambulatory Visit (INDEPENDENT_AMBULATORY_CARE_PROVIDER_SITE_OTHER): Payer: Medicare Other | Admitting: Physical Medicine and Rehabilitation

## 2016-09-05 ENCOUNTER — Encounter: Payer: Self-pay | Admitting: Family Medicine

## 2016-09-05 ENCOUNTER — Ambulatory Visit (INDEPENDENT_AMBULATORY_CARE_PROVIDER_SITE_OTHER): Payer: Medicare Other | Admitting: Family Medicine

## 2016-09-05 VITALS — BP 130/80 | HR 98 | Temp 98.6°F | Resp 16 | Ht 61.0 in | Wt 125.2 lb

## 2016-09-05 DIAGNOSIS — K1379 Other lesions of oral mucosa: Secondary | ICD-10-CM

## 2016-09-05 MED ORDER — NYSTATIN 100000 UNIT/ML MT SUSP: 5.0000 mL | Freq: Four times a day (QID) | OROMUCOSAL | 0 refills | Status: DC

## 2016-09-05 NOTE — Progress Notes (Signed)
Chief Complaint  Patient presents with  . Thrush    thick coating in mouth, can't taste food, denies any abx recently   Thinks she has thrush No recent antibiotics Started after dry mouth from new pain medicine given at outside ER Dentures hurt mouth and tongue coated Gums sore Cannot taste food Sees ortho again tomorrow Very worried about "fluid" on knee,  Had a cortisone injection and it didn't help.  Wants fluid drained.  Is advised to address this with Dr Lorin Mercy tomorrow.    Patient Active Problem List   Diagnosis Date Noted  . Recurrent UTI (urinary tract infection) 03/02/2016  . Torn rotator cuff 03/02/2016  . Essential hypertension 03/02/2016  . HLD (hyperlipidemia) 03/02/2016  . Hematuria 03/02/2016  . 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 Pancreatic cyst 08/28/2014  . Hepatic cyst 08/28/2014    Outpatient Encounter Prescriptions as of 09/05/2016  Medication Sig  . acetaminophen (TYLENOL) 500 MG tablet Take 1,000 mg by mouth every 6 (six) hours as needed for mild pain.  Marland Kitchen ALPRAZolam (XANAX) 0.5 MG tablet Take 1 tablet (0.5 mg total) by mouth at bedtime as needed for anxiety.  Marland Kitchen aspirin EC 81 MG tablet Take 81 mg by mouth.  Geronimo Boot XT 300 MG 24 hr capsule Take 1 capsule (300 mg total) by mouth every morning.  . Multiple Vitamin (MULTIVITAMIN WITH MINERALS) TABS tablet Take 1 tablet by mouth every morning.   . Omega-3 Fatty Acids (FISH OIL) 1000 MG CAPS Take 1 capsule by mouth every morning.   . Potassium 99 MG TABS Take by mouth.  . Red Yeast Rice 600 MG CAPS Take by mouth.  Marland Kitchen ibuprofen (ADVIL,MOTRIN) 600 MG tablet   . naproxen (NAPROSYN) 500 MG tablet   . nystatin (MYCOSTATIN) 100000 UNIT/ML suspension Take 5 mLs (500,000 Units total) by mouth 4 (four) times daily.  . traMADol (ULTRAM) 50 MG tablet    No facility-administered encounter medications on file as of 09/05/2016.     Allergies  Allergen Reactions  . Levaquin [Levofloxacin] Other (See Comments)   "tendon damage"  . Statins Other (See Comments)    "takes the taste out of my mouth    Review of Systems  Constitutional: Negative for chills and fever.  HENT: Positive for dental problem and mouth sores.   Musculoskeletal: Positive for arthralgias and joint swelling.  All other systems reviewed and are negative.   BP 130/80 (BP Location: Left Arm, Patient Position: Sitting, Cuff Size: Normal)   Pulse 98   Temp 98.6 F (37 C) (Other (Comment))   Resp 16   Ht 5\' 1"  (1.549 m)   Wt 125 lb 4 oz (56.8 kg)   LMP 02/07/1999 (Approximate)   SpO2 99%   BMI 23.67 kg/m   Physical Exam  Constitutional: She appears well-developed and well-nourished. No distress.  Minimal limp, not uncomfortable  HENT:  Head: Normocephalic and atraumatic.  Right Ear: External ear normal.  Left Ear: External ear normal.  Nose: Nose normal.  Gums slightly red, tongue coated, no white lesions seen.  Gargled with peroxide today  Eyes: Pupils are equal, round, and reactive to light. Conjunctivae are normal.  Neck: Normal range of motion.  Musculoskeletal: Normal range of motion. She exhibits no edema.  Mild effusion and warmth R knee  Lymphadenopathy:    She has no cervical adenopathy.    ASSESSMENT/PLAN:  1. Mouth sores Poss thrush?  No recent antibiotics or reason to have.  May be irritation frum denture  use with dry mouth   Patient Instructions  Use the nystatin for the sore mouth May continue the diluted peroxide mouth wash  See Dr Lorin Mercy tomorrow  See me in October for a physical and well order mammogram and bone density needed   Crystal Everts, MD

## 2016-09-05 NOTE — Patient Instructions (Signed)
Use the nystatin for the sore mouth May continue the diluted peroxide mouth wash  See Dr Lorin Mercy tomorrow  See me in October for a physical and well order mammogram and bone density needed

## 2016-09-06 ENCOUNTER — Encounter (INDEPENDENT_AMBULATORY_CARE_PROVIDER_SITE_OTHER): Payer: Self-pay | Admitting: Orthopaedic Surgery

## 2016-09-06 ENCOUNTER — Ambulatory Visit (INDEPENDENT_AMBULATORY_CARE_PROVIDER_SITE_OTHER): Payer: Medicare Other | Admitting: Orthopaedic Surgery

## 2016-09-06 ENCOUNTER — Telehealth (INDEPENDENT_AMBULATORY_CARE_PROVIDER_SITE_OTHER): Payer: Self-pay | Admitting: Orthopaedic Surgery

## 2016-09-06 VITALS — BP 135/86 | HR 77 | Ht 62.0 in | Wt 130.0 lb

## 2016-09-06 DIAGNOSIS — G8929 Other chronic pain: Secondary | ICD-10-CM

## 2016-09-06 DIAGNOSIS — M25561 Pain in right knee: Secondary | ICD-10-CM | POA: Diagnosis not present

## 2016-09-06 DIAGNOSIS — M659 Synovitis and tenosynovitis, unspecified: Secondary | ICD-10-CM

## 2016-09-06 MED ORDER — IBUPROFEN 800 MG PO TABS: ORAL_TABLET | ORAL | 2 refills | Status: DC

## 2016-09-06 NOTE — Progress Notes (Signed)
Office Visit Note   Patient: Crystal Cooke           Date of Birth: 01-24-1951           MRN: [id number] Visit Date: 09/06/2016              Requested by: Raylene Everts, MD [phone] [place], [place] [address] PCP: Raylene Everts, MD   Assessment & Plan: Visit Diagnoses:  1. Chronic pain of right knee   2. Synovitis of right knee     Plan: Knee aspiration performed with 35 mL removed clear fluid with slight cartilage debris slight cloudiness. Sent for cell count with differential and also crystal analysis. ADDENDUM: fluid shows extracellular calcium pyrophosphate crystals, negative Gram stain. Fluid typical for synovitis and negative for gout, negative for pyarthrosis. Follow-Up Instructions: Return in about 4 weeks (around 10/04/2016).   Orders:  Orders Placed This Encounter  Procedures  . Gram stain  . Cell count + diff,  w/ cryst-synvl fld   Meds ordered this encounter  Medications  . ibuprofen (ADVIL,MOTRIN) 800 MG tablet    Sig: One PO bid with food    Dispense:  60 tablet    Refill:  2      Procedures: Large Joint Inj Date/Time: 09/11/2016 2:47 PM Performed by: Marybelle Killings Authorized by: Marybelle Killings   Consent Given by:  Patient Site marked: the procedure site was marked   Indications:  Pain and joint swelling Location:  Knee Site:  R knee Needle Size:  22 G Needle Length:  1.5 inches Approach:  Anterolateral Ultrasound Guidance: No   Fluoroscopic Guidance: No   Arthrogram: No   Medications:  1 mL lidocaine 1 % Aspiration Attempted: Yes   Aspirate amount (mL):  35 Patient tolerance:  Patient tolerated the procedure well with no immediate complications     Clinical Data: No additional findings.   Subjective: Chief Complaint  Patient presents with  . Right Knee - Pain, Follow-up  . Lower Back - Pain    HPI 66 year old female returns with ongoing pain in her right knee. She had intra-articular injections 08/16/2016 and  had some improvement for. Time. Hernia swelled up once again and she requested aspiration done today since her knee is swollen stiff and she has difficulty bending her knee and walking due to the effusion. No history of gout she is taking ibuprofen 800 mg twice a day and also Tylenol without relief. She states she has pain with ambulation it bothers her at night and aches. Swelling is worse by the end of the day. Doesn't particularly bother her in the middle of the night. She denies chills or fever history of rheumatoid arthritis. She does have associated back problems with previous epidural injections with previous MRI showing mild anterolisthesis at L3-4, L4-5, and also L5-S1 with disc bulging and lateral recess and foraminal narrowing at L3-4 and also L4-5. She states she went to defer any possible lumbar surgery and a principal problem is her knee.  Review of Systems review of systems is updated and unchanged from 06/22/2016 other than as mentioned in history of present illness. 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 Positive for recurrent right knee synovitis.   Objective: Vital Signs: BP 135/86   Pulse 77   Ht 5\' 2"  (1.575 m)   Wt 130 lb (59 kg)   LMP  02/07/1999 (Approximate)   BMI 23.78 kg/m   Physical Exam  Constitutional: She is oriented to person, place, and time. She appears well-developed.  HENT:  Head: Normocephalic.  Right Ear: External ear normal.  Left Ear: External ear normal.  Eyes: Pupils are equal, round, and reactive to light.  Neck: No tracheal deviation present. No thyromegaly present.  Cardiovascular: Normal rate.   Pulmonary/Chest: Effort normal.  Abdominal: Soft.  Musculoskeletal:  Patient has an effusion of the right knee is not particularly warm no pain with hip range of motion and straight leg raising. No palpable Baker's cyst  collateral cruciate ligament exam is normal she has some mild crepitus with knee extension normal patellar tracking as bursa is normal. Distal pulses are 2+ negative Homans sign no pitting edema. EHL anterior tib gastrocsoleus quads are strong without deficit. Opposite knee shows good stability. No lymphadenopathy good cervical range of motion. She has some sciatic notch tenderness bilaterally and some pain with flexion extension of lumbar spine as well as rotation.  Neurological: She is alert and oriented to person, place, and time.  Skin: Skin is warm and dry.  Psychiatric: She has a normal mood and affect. Her behavior is normal.    Ortho Exam  Specialty Comments:  No specialty comments available.  Imaging: No results found.   PMFS History: Patient Active Problem List   Diagnosis Date Noted  . Recurrent UTI (urinary tract infection) 03/02/2016  . Torn rotator cuff 03/02/2016  . Essential hypertension 03/02/2016  . HLD (hyperlipidemia) 03/02/2016  . Hematuria 03/02/2016  . 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 Pancreatic cyst 08/28/2014  . Hepatic cyst 08/28/2014   Past Medical History:  Diagnosis Date  . Allergy   . Anxiety   . Atrial fibrillation ([place])    ??? has never seen cardiologist per patient  . DDD (degenerative disc disease), lumbar   . Hypercholesteremia   . Hypertension     Family History  Problem Relation Age of Onset  . Lung cancer Mother           . Hypertension Mother   . Cancer Mother   . Diabetes Father   . Hypertension Father   . Lung cancer Father        deceased age 54  . Cancer Father   . Colon cancer Sister        deceased 2014-05-13, age 11  . Pancreatic cancer Neg Hx     Past Surgical History:  Procedure Laterality Date  . COLONOSCOPY  05/05/14   Dr.Benson- normal colonoscopy, retroflexed views revealed no abnormalities.   . ESOPHAGOGASTRODUODENOSCOPY  05/05/14   Dr.Benson- normal EGD, retroflexed views revealed no abnormalities   . EUS N/A  09/24/2014   Procedure: UPPER ENDOSCOPIC ULTRASOUND (EUS) RADIAL;  Surgeon: Milus Banister, MD;  Location: [facility];  Service: Endoscopy;  Laterality: N/A;  . none    . 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 Smoking status: Never Smoker  . Smokeless tobacco: Never Used  . Alcohol use No  . Drug use: No  . Sexual activity: Not Currently

## 2016-09-06 NOTE — Telephone Encounter (Signed)
PT ASKED IF SHE CAN HAVE DICTATION MAILED TO HER.

## 2016-09-07 ENCOUNTER — Telehealth (INDEPENDENT_AMBULATORY_CARE_PROVIDER_SITE_OTHER): Payer: Self-pay | Admitting: Radiology

## 2016-09-07 LAB — SYNOVIAL CELL COUNT + DIFF, W/ CRYSTALS
Basophils, %: 0 %
EOSINOPHILS-SYNOVIAL: 0 % (ref 0–2)
Lymphocytes-Synovial Fld: 2 % (ref 0–74)
Monocyte/Macrophage: 78 % — ABNORMAL HIGH (ref 0–69)
Neutrophil, Synovial: 20 % (ref 0–24)
SYNOVIOCYTES, %: 0 % (ref 0–15)
WBC, SYNOVIAL: 455 {cells}/uL — AB (ref <150)

## 2016-09-07 LAB — GRAM STAIN: Gram Stain: NONE SEEN

## 2016-09-07 NOTE — Telephone Encounter (Signed)
Patient states that she had a little bit of fluid drawn off of her knee yesterday and she would like to speak to Dr. Lorin Mercy andd come back in and have all of the fluid drawn off. She states that knee is swollen and looks deformed again.  I explained to the patient that Dr. Lorin Mercy is out of the office until next week. I asked if she would like for me to try and work her in with one of his partners and she asked if they would draw the fluid off. I explained to her that she would be evaluated by them and I could not guarantee what would be done. She wants to wait and talk with Dr. Lorin Mercy.  I told the patient that I would be glad to send the message to Dr. Lorin Mercy and I would call her as soon as I have an answer back. I did inform her it would not be until next Monday or Tuesday.  She would also like for her last office visit note to be mailed to her home. The address in the chart is correct.

## 2016-09-07 NOTE — Telephone Encounter (Signed)
Noted. Will mail once complete.

## 2016-09-07 NOTE — Telephone Encounter (Signed)
Please see below and advise.

## 2016-09-11 DIAGNOSIS — M659 Synovitis and tenosynovitis, unspecified: Secondary | ICD-10-CM | POA: Diagnosis not present

## 2016-09-11 DIAGNOSIS — M25561 Pain in right knee: Secondary | ICD-10-CM | POA: Diagnosis not present

## 2016-09-11 MED ORDER — LIDOCAINE HCL 1 % IJ SOLN
1.0000 mL | INTRAMUSCULAR | Status: AC | PRN
  Administered 2016-09-11: 1 mL

## 2016-09-11 NOTE — Telephone Encounter (Signed)
I called.   Wants repeat aspiration and cortisone injection. Please call her to scheduled. thanks

## 2016-09-11 NOTE — Telephone Encounter (Signed)
Patient called and I scheduled appt.

## 2016-09-13 ENCOUNTER — Ambulatory Visit (INDEPENDENT_AMBULATORY_CARE_PROVIDER_SITE_OTHER): Payer: Medicare Other | Admitting: Physical Medicine and Rehabilitation

## 2016-09-18 ENCOUNTER — Telehealth: Payer: Self-pay | Admitting: Family Medicine

## 2016-09-18 NOTE — Telephone Encounter (Signed)
Patient calling regarding the prescription Nystatin for the thrush she had when she came in the end of July.  She is asking if there is something else she can try since this is not going away. She is asking if her Dx is Candida.  Please advise        [facility] in [place]

## 2016-09-18 NOTE — Telephone Encounter (Signed)
If it didn't work, then it was not thrush.  Needs to see her dentist of gum irritation persists.

## 2016-09-18 NOTE — Telephone Encounter (Signed)
Called and spoke to Crystal Cooke, aware of mds advice, and to call dentist.

## 2016-09-19 ENCOUNTER — Encounter (INDEPENDENT_AMBULATORY_CARE_PROVIDER_SITE_OTHER): Payer: Self-pay | Admitting: Orthopaedic Surgery

## 2016-09-19 ENCOUNTER — Ambulatory Visit (INDEPENDENT_AMBULATORY_CARE_PROVIDER_SITE_OTHER): Payer: Medicare Other | Admitting: Orthopaedic Surgery

## 2016-09-19 VITALS — BP 140/86 | HR 78

## 2016-09-19 DIAGNOSIS — M659 Synovitis and tenosynovitis, unspecified: Secondary | ICD-10-CM

## 2016-09-19 MED ORDER — BUPIVACAINE HCL 0.25 % IJ SOLN
0.6600 mL | INTRAMUSCULAR | Status: AC | PRN
  Administered 2016-09-19: .66 mL via INTRA_ARTICULAR

## 2016-09-19 MED ORDER — METHYLPREDNISOLONE ACETATE 40 MG/ML IJ SUSP
40.0000 mg | INTRAMUSCULAR | Status: AC | PRN
  Administered 2016-09-19: 40 mg via INTRA_ARTICULAR

## 2016-09-19 MED ORDER — LIDOCAINE HCL 1 % IJ SOLN
1.0000 mL | INTRAMUSCULAR | Status: AC | PRN
  Administered 2016-09-19: 1 mL

## 2016-09-19 NOTE — Progress Notes (Signed)
Office Visit Note   Patient: Crystal Cooke           Date of Birth: 1950/11/19           MRN: [id number] Visit Date: 09/19/2016              Requested by: Raylene Everts, MD [phone] [place], [place] [address] PCP: Raylene Everts, MD   Assessment & Plan: Visit Diagnoses:  1. Synovitis of right knee        Calcium pyrophosphate crystals, extracellular  Plan: 45 mL aspirated clear yellow fluid with slight cartilage matter. Aspiration with cortisone. She'll take her ibuprofen 800 mg by mouth twice a day with meals for 3 weeks and I'll recheck her in one month.  Follow-Up Instructions: Return in about 1 month (around 10/20/2016).   Orders:  No orders of the defined types were placed in this encounter.  No orders of the defined types were placed in this encounter.     Procedures: Large Joint Inj Date/Time: 09/19/2016 10:24 AM Performed by: Marybelle Killings Authorized by: Marybelle Killings   Consent Given by:  Patient Site marked: the procedure site was marked   Indications:  Pain and joint swelling Location:  Knee Site:  R knee Needle Size:  22 G Needle Length:  1.5 inches Approach:  Anterolateral Ultrasound Guidance: No   Fluoroscopic Guidance: No   Arthrogram: No   Medications:  1 mL lidocaine 1 %; 40 mg methylPREDNISolone acetate 40 MG/ML; 0.66 mL bupivacaine 0.25 % Aspiration Attempted: Yes   Aspirate amount (mL):  45 Aspirate:  Yellow and clear Patient tolerance:  Patient tolerated the procedure well with no immediate complications     Clinical Data: No additional findings.   Subjective: Chief Complaint  Patient presents with  . Right Knee - Pain    HPI patient returns with recurrent effusion right knee. Previous aspirate showed extracellular calcium pyrophosphate crystals. She requested repeat aspiration due to tightness pain with ambulation and 10 seen it makes it difficult to flex.  Review of Systems 14 review of systems updated and  unchanged from last office visit on 09/06/2016.   Objective: Vital Signs: BP 140/86   Pulse 78   LMP 02/07/1999 (Approximate)   Physical Exam  Constitutional: She is oriented to person, place, and time. She appears well-developed.  HENT:  Head: Normocephalic.  Right Ear: External ear normal.  Left Ear: External ear normal.  Eyes: Pupils are equal, round, and reactive to light.  Neck: No tracheal deviation present. No thyromegaly present.  Cardiovascular: Normal rate.   Pulmonary/Chest: Effort normal.  Abdominal: Soft.  Musculoskeletal:  3+ synovitis right knee no increased warmth pain with extremes of flexion extension collateral cruciate ligament exam is normal mild crepitus with extension. Normal patellar tracking. Distal pulses are intact no sciatic notch tenderness. She has some tenderness on lumbosacral junction.  Neurological: She is alert and oriented to person, place, and time.  Skin: Skin is warm and dry.  Psychiatric: She has a normal mood and affect. Her behavior is normal.    Ortho Exam  Specialty Comments:  No specialty comments available.  Imaging: No results found.   PMFS History: Patient Active Problem List   Diagnosis Date Noted  . Recurrent UTI (urinary tract infection) 03/02/2016  . Torn rotator cuff 03/02/2016  . Essential hypertension 03/02/2016  . HLD (hyperlipidemia) 03/02/2016  . Hematuria 03/02/2016  . 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 Pancreatic cyst 08/28/2014  .  Hepatic cyst 08/28/2014   Past Medical History:  Diagnosis Date  . Allergy   . Anxiety   . Atrial fibrillation ([place])    ??? has never seen cardiologist per patient  . DDD (degenerative disc disease), lumbar   . Hypercholesteremia   . Hypertension     Family History  Problem Relation Age of Onset  . Lung cancer Mother           . Hypertension Mother   . Cancer Mother   . Diabetes Father   . Hypertension Father   . Lung cancer Father        deceased age 39  . Cancer Father    . Colon cancer Sister        deceased May 25, 2014, age 60  . Pancreatic cancer Neg Hx     Past Surgical History:  Procedure Laterality Date  . COLONOSCOPY  05/05/14   Dr.Benson- normal colonoscopy, retroflexed views revealed no abnormalities.   . ESOPHAGOGASTRODUODENOSCOPY  05/05/14   Dr.Benson- normal EGD, retroflexed views revealed no abnormalities   . EUS N/A 09/24/2014   Procedure: UPPER ENDOSCOPIC ULTRASOUND (EUS) RADIAL;  Surgeon: Milus Banister, MD;  Location: [facility];  Service: Endoscopy;  Laterality: N/A;  . none    . 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 Smoking status: Never Smoker  . Smokeless tobacco: Never Used  . Alcohol use No  . Drug use: No  . Sexual activity: Not Currently

## 2016-10-10 ENCOUNTER — Ambulatory Visit (INDEPENDENT_AMBULATORY_CARE_PROVIDER_SITE_OTHER): Payer: Medicare Other | Admitting: Orthopaedic Surgery

## 2016-10-24 ENCOUNTER — Ambulatory Visit (INDEPENDENT_AMBULATORY_CARE_PROVIDER_SITE_OTHER): Payer: Medicare Other | Admitting: Orthopaedic Surgery

## 2016-10-24 ENCOUNTER — Encounter (INDEPENDENT_AMBULATORY_CARE_PROVIDER_SITE_OTHER): Payer: Self-pay | Admitting: Orthopaedic Surgery

## 2016-10-24 VITALS — BP 139/79 | HR 86

## 2016-10-24 DIAGNOSIS — M25561 Pain in right knee: Secondary | ICD-10-CM

## 2016-10-24 MED ORDER — LIDOCAINE HCL 1 % IJ SOLN
1.0000 mL | INTRAMUSCULAR | Status: AC | PRN
  Administered 2016-10-24: 1 mL

## 2016-10-24 NOTE — Progress Notes (Signed)
Office Visit Note   Patient: Crystal Cooke           Date of Birth: 01-24-1951           MRN: [id number] Visit Date: 10/24/2016              Requested by: Raylene Everts, MD [phone] [place], [place] [address] PCP: Raylene Everts, MD   Assessment & Plan: Visit Diagnoses:  1. Right knee pain, unspecified chronicity      With recurrent knee effusion post injection 2.  Plan: We'll proceed with the MRI scan rule out meniscal tears cause for recurrent knee effusion. She's had repetitive catching but no true locking. Cortisone injection 2 has given her only temporary relief with recurrence of knee effusion pain with walking and catching. Follow-Up Instructions: No Follow-up on file.   Orders:  Orders Placed This Encounter  Procedures  . Large Joint Injection/Arthrocentesis   No orders of the defined types were placed in this encounter.     Procedures: Large Joint Inj Date/Time: 10/24/2016 10:14 AM Performed by: Marybelle Killings Authorized by: Marybelle Killings   Consent Given by:  Patient Site marked: the procedure site was marked   Indications:  Pain and joint swelling Location:  Knee Site:  R knee Needle Size:  22 G Needle Length:  1.5 inches Approach:  Anterolateral Ultrasound Guidance: No   Fluoroscopic Guidance: No   Arthrogram: No   Medications:  1 mL lidocaine 1 % Aspiration Attempted: Yes   Aspirate amount (mL):  30 Patient tolerance:  Patient tolerated the procedure well with no immediate complications     Clinical Data: No additional findings.   Subjective: Chief Complaint  Patient presents with  . Right Knee - Pain    HPI 66 year old female returns with recurrent effusion. Aspiration previously showed CPPD extracellular. Check cortisone injection 2 and now has recurrent effusion difficulty flexing past 90 and difficulty reaching full extension. She is ambulating and having a limp. She requested repeat aspiration which was  performed today. She's had repetitive catching in her knee but no true locking. She had the 2 weeks with the no swelling in her knee until she states the cortisone wore off and now swollen painful again.  Review of Systems unchanged from last office visit other than as mentioned above   Objective: Vital Signs: BP 139/79   Pulse 86   LMP 02/07/1999 (Approximate)   Physical Exam  Constitutional: She is oriented to person, place, and time. She appears well-developed.  HENT:  Head: Normocephalic.  Right Ear: External ear normal.  Left Ear: External ear normal.  Eyes: Pupils are equal, round, and reactive to light.  Neck: No tracheal deviation present. No thyromegaly present.  Cardiovascular: Normal rate.   Pulmonary/Chest: Effort normal.  Abdominal: Soft.  Neurological: She is alert and oriented to person, place, and time.  Skin: Skin is warm and dry.  Psychiatric: She has a normal mood and affect. Her behavior is normal.    Ortho Exam patient has a 3+ knee effusion on the right collateral ligaments are stable she has medial posterior joint line tenderness no palpable Baker's cyst. Some crepitus in the extension no increased warmth no pain with hip range of motion cessation her foot is normal no rash or exposed skin.  Specialty Comments:  No specialty comments available.  Imaging: No results found.   PMFS History: Patient Active Problem List   Diagnosis Date Noted  .  Recurrent UTI (urinary tract infection) 03/02/2016  . Torn rotator cuff 03/02/2016  . Essential hypertension 03/02/2016  . HLD (hyperlipidemia) 03/02/2016  . Hematuria 03/02/2016  . 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 Pancreatic cyst 08/28/2014  . Hepatic cyst 08/28/2014   Past Medical History:  Diagnosis Date  . Allergy   . Anxiety   . Atrial fibrillation ([place])    ??? has never seen cardiologist per patient  . DDD (degenerative disc disease), lumbar   . Hypercholesteremia   . Hypertension     Family  History  Problem Relation Age of Onset  . Lung cancer Mother           . Hypertension Mother   . Cancer Mother   . Diabetes Father   . Hypertension Father   . Lung cancer Father        deceased age 27  . Cancer Father   . Colon cancer Sister        deceased 2014/05/17, age 71  . Pancreatic cancer Neg Hx     Past Surgical History:  Procedure Laterality Date  . COLONOSCOPY  05/17/2014   Dr.Benson- normal colonoscopy, retroflexed views revealed no abnormalities.   . ESOPHAGOGASTRODUODENOSCOPY  05-17-2014   Dr.Benson- normal EGD, retroflexed views revealed no abnormalities   . EUS N/A 09/24/2014   Procedure: UPPER ENDOSCOPIC ULTRASOUND (EUS) RADIAL;  Surgeon: Milus Banister, MD;  Location: [facility];  Service: Endoscopy;  Laterality: N/A;  . none    . 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 Smoking status: Never Smoker  . Smokeless tobacco: Never Used  . Alcohol use No  . Drug use: No  . Sexual activity: Not Currently

## 2016-10-24 NOTE — Addendum Note (Signed)
Addended by: Meyer Cory on: 10/24/2016 10:36 AM   Modules accepted: Orders

## 2016-11-03 ENCOUNTER — Ambulatory Visit: Payer: Medicare Other | Admitting: Family Medicine

## 2016-11-07 ENCOUNTER — Other Ambulatory Visit (HOSPITAL_COMMUNITY)
Admission: RE | Admit: 2016-11-07 | Discharge: 2016-11-07 | Disposition: A | Discharge status: Home / Self Care | Payer: Medicare Other | Source: Ambulatory Visit | Attending: Family Medicine | Admitting: Family Medicine

## 2016-11-07 ENCOUNTER — Telehealth: Payer: Self-pay | Admitting: Family Medicine

## 2016-11-07 ENCOUNTER — Ambulatory Visit (INDEPENDENT_AMBULATORY_CARE_PROVIDER_SITE_OTHER): Payer: Medicare Other | Admitting: Family Medicine

## 2016-11-07 ENCOUNTER — Encounter: Payer: Self-pay | Admitting: Family Medicine

## 2016-11-07 VITALS — BP 136/80 | HR 76 | Temp 99.1°F | Resp 18 | Ht 62.0 in | Wt 125.0 lb

## 2016-11-07 DIAGNOSIS — H43399 Other vitreous opacities, unspecified eye: Secondary | ICD-10-CM

## 2016-11-07 DIAGNOSIS — Z114 Encounter for screening for human immunodeficiency virus [HIV]: Secondary | ICD-10-CM

## 2016-11-07 DIAGNOSIS — E785 Hyperlipidemia, unspecified: Secondary | ICD-10-CM | POA: Diagnosis not present

## 2016-11-07 DIAGNOSIS — Z202 Contact with and (suspected) exposure to infections with a predominantly sexual mode of transmission: Secondary | ICD-10-CM | POA: Diagnosis not present

## 2016-11-07 DIAGNOSIS — Z23 Encounter for immunization: Secondary | ICD-10-CM

## 2016-11-07 DIAGNOSIS — M25561 Pain in right knee: Secondary | ICD-10-CM

## 2016-11-07 DIAGNOSIS — G8929 Other chronic pain: Secondary | ICD-10-CM

## 2016-11-07 DIAGNOSIS — I10 Essential (primary) hypertension: Secondary | ICD-10-CM | POA: Diagnosis not present

## 2016-11-07 DIAGNOSIS — B351 Tinea unguium: Secondary | ICD-10-CM

## 2016-11-07 DIAGNOSIS — L608 Other nail disorders: Secondary | ICD-10-CM

## 2016-11-07 DIAGNOSIS — Z780 Asymptomatic menopausal state: Secondary | ICD-10-CM | POA: Diagnosis not present

## 2016-11-07 MED ORDER — CICLOPIROX 8 % EX SOLN: Freq: Every day | CUTANEOUS | 0 refills | Status: DC

## 2016-11-07 MED ORDER — ALPRAZOLAM 0.5 MG PO TABS: 0.5000 mg | ORAL_TABLET | Freq: Every evening | ORAL | 0 refills | Status: DC | PRN
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
## 2016-11-07 NOTE — Patient Instructions (Signed)
Labs today I will send you a letter with your test results.  If there is anything of concern, we will call right away.  Flu shot today prevnar pneumonia shot today Laquer for toenails Referred to eye doctor Refill medicines needed  See me in six months Call sooner for problems

## 2016-11-07 NOTE — Telephone Encounter (Signed)
Podiatry consult if concerned

## 2016-11-07 NOTE — Telephone Encounter (Signed)
Patient unable to afford the Rx for the feet @ [facility] in [place].  Is there is something else.  Please respond ASAP.

## 2016-11-08 LAB — URINE CYTOLOGY ANCILLARY ONLY
Chlamydia: NEGATIVE
Neisseria Gonorrhea: NEGATIVE
TRICH (WINDOWPATH): NEGATIVE

## 2016-11-08 NOTE — Telephone Encounter (Signed)
Called Crystal Cooke, would like to see podiatry.

## 2016-11-08 NOTE — Progress Notes (Signed)
Request sent to [facility]

## 2016-11-09 ENCOUNTER — Encounter: Payer: Self-pay | Admitting: Family Medicine

## 2016-11-09 LAB — COMPLETE METABOLIC PANEL WITH GFR
AG Ratio: 1.6 (calc) (ref 1.0–2.5)
ALBUMIN MSPROF: 4.4 g/dL (ref 3.6–5.1)
ALKALINE PHOSPHATASE (APISO): 86 U/L (ref 33–130)
ALT: 15 U/L (ref 6–29)
AST: 20 U/L (ref 10–35)
BILIRUBIN TOTAL: 0.7 mg/dL (ref 0.2–1.2)
BUN / CREAT RATIO: 14 (calc) (ref 6–22)
BUN: 15 mg/dL (ref 7–25)
CHLORIDE: 101 mmol/L (ref 98–110)
CO2: 31 mmol/L (ref 20–32)
CREATININE: 1.07 mg/dL — AB (ref 0.50–0.99)
Calcium: 10 mg/dL (ref 8.6–10.4)
GFR, Est African American: 62 mL/min/{1.73_m2} (ref >=60)
GFR, Est Non African American: 54 mL/min/{1.73_m2} — ABNORMAL LOW (ref >=60)
GLUCOSE: 116 mg/dL — AB (ref 65–99)
Globulin: 2.7 g/dL (calc) (ref 1.9–3.7)
Potassium: 3.2 mmol/L — ABNORMAL LOW (ref 3.5–5.3)
Sodium: 142 mmol/L (ref 135–146)
Total Protein: 7.1 g/dL (ref 6.1–8.1)

## 2016-11-09 LAB — CBC
HCT: 35.7 % (ref 35.0–45.0)
HEMOGLOBIN: 12 g/dL (ref 11.7–15.5)
MCH: 31.8 pg (ref 27.0–33.0)
MCHC: 33.6 g/dL (ref 32.0–36.0)
MCV: 94.7 fL (ref 80.0–100.0)
MPV: 10.5 fL (ref 7.5–12.5)
PLATELETS: 245 10*3/uL (ref 140–400)
RBC: 3.77 10*6/uL — ABNORMAL LOW (ref 3.80–5.10)
RDW: 11.9 % (ref 11.0–15.0)
WBC: 9 10*3/uL (ref 3.8–10.8)

## 2016-11-09 LAB — RPR TITER

## 2016-11-09 LAB — URINALYSIS, ROUTINE W REFLEX MICROSCOPIC
BACTERIA UA: NONE SEEN /HPF
Bilirubin Urine: NEGATIVE
GLUCOSE, UA: NEGATIVE
HYALINE CAST: NONE SEEN /LPF
KETONES UR: NEGATIVE
Nitrite: NEGATIVE
PH: 6 (ref 5.0–8.0)
Specific Gravity, Urine: 1.006 (ref 1.001–1.03)
Squamous Epithelial / LPF: NONE SEEN /HPF (ref <=5)

## 2016-11-09 LAB — LIPID PANEL
CHOLESTEROL: 262 mg/dL — AB (ref <200)
HDL: 84 mg/dL (ref >50)
LDL CHOLESTEROL (CALC): 160 mg/dL — AB
Non-HDL Cholesterol (Calc): 178 mg/dL (calc) — ABNORMAL HIGH (ref <130)
Total CHOL/HDL Ratio: 3.1 (calc) (ref <5.0)
Triglycerides: 78 mg/dL (ref <150)

## 2016-11-09 LAB — FLUORESCENT TREPONEMAL AB(FTA)-IGG-BLD: FLUORESCENT TREPONEMAL ABS: NONREACTIVE

## 2016-11-09 LAB — RPR: RPR Ser Ql: REACTIVE — AB

## 2016-11-09 LAB — HIV ANTIBODY (ROUTINE TESTING W REFLEX): HIV 1&2 Ab, 4th Generation: NONREACTIVE

## 2016-11-10 LAB — URINE CYTOLOGY ANCILLARY ONLY
BACTERIAL VAGINITIS: NEGATIVE
Candida vaginitis: NEGATIVE

## 2016-11-13 ENCOUNTER — Ambulatory Visit
Admission: RE | Admit: 2016-11-13 | Discharge: 2016-11-13 | Disposition: A | Discharge status: Home / Self Care | Payer: Medicare Other | Source: Ambulatory Visit | Attending: Orthopaedic Surgery | Admitting: Orthopaedic Surgery

## 2016-11-13 DIAGNOSIS — M25561 Pain in right knee: Secondary | ICD-10-CM | POA: Diagnosis not present

## 2016-11-15 ENCOUNTER — Telehealth (INDEPENDENT_AMBULATORY_CARE_PROVIDER_SITE_OTHER): Payer: Self-pay | Admitting: Orthopaedic Surgery

## 2016-11-15 ENCOUNTER — Encounter (INDEPENDENT_AMBULATORY_CARE_PROVIDER_SITE_OTHER): Payer: Self-pay | Admitting: Orthopaedic Surgery

## 2016-11-15 ENCOUNTER — Ambulatory Visit (INDEPENDENT_AMBULATORY_CARE_PROVIDER_SITE_OTHER): Payer: Medicare Other | Admitting: Orthopaedic Surgery

## 2016-11-15 VITALS — BP 135/84 | HR 85 | Ht 61.0 in | Wt 125.0 lb

## 2016-11-15 DIAGNOSIS — S83271D Complex tear of lateral meniscus, current injury, right knee, subsequent encounter: Secondary | ICD-10-CM

## 2016-11-15 NOTE — Telephone Encounter (Signed)
Patient cannot do surgery on Oct 15.  She is now being scheduled out until 10/29 or so. She would like to come in to [place] office tomorrow and have knee aspirated because it is very uncomfortable. Please advise on appt.

## 2016-11-15 NOTE — Progress Notes (Signed)
Office Visit Note   Patient: Crystal Cooke           Date of Birth: 26-Feb-1950           MRN: [id number] Visit Date: 11/15/2016              Requested by: Raylene Everts, MD [phone] [place], [place] [address] PCP: Raylene Everts, MD   Assessment & Plan: Visit Diagnoses:  1. Complex tear of lateral meniscus of right knee as current injury, subsequent encounter     Plan: She said the repeat aspiration and injection she's taken anti-inflammatories. We discussed knee arthroscopy partial meniscectomy since her knee is still catching painful difficulty walking. Portion of her pain may be related to the chondromalacia lateral compartment versus a loose body is present. Plan would be arthroscopy with removal loose body partial lateral meniscectomy and chondroplasty as needed. Plain radiographs do not look severe enough that it appears that she will need total knee arthroplasty at this point. She may have more severe cartilage wear than indicated on MRI scan and plain radiograph. We discussed options and patient likely with arthroscopy. She has sister that lives out of town and she left work on getting her to be able to travel and help her in the postoperative time period.  Follow-Up Instructions: No Follow-up on file.   Orders:  No orders of the defined types were placed in this encounter.  No orders of the defined types were placed in this encounter.     Procedures: No procedures performed   Clinical Data: No additional findings.   Subjective: Chief Complaint  Patient presents with  . Right Knee - Follow-up    MRI review    HPI 66 year old female returns with Recurrent right knee synovitis and pain. She has difficulty bending and straightening her knee. She had previous aspiration and the injection 09/19/2016. Aspiration 10/24/2016. She states pain is come back and she's been taking ibuprofen without relief. MRI scan of her knee was obtained and is available  for review. This shows complex tear of the anterior horn lateral meniscus. Lateral compartment arthritis with loss of cartilage. Loose body in the joint posterior medial large joint effusion and moderate Baker's cyst.  Review of Systems review of systems is updated and is unchanged from last office visit.   Objective: Vital Signs: BP 135/84   Pulse 85   Ht 5\' 1"  (1.549 m)   Wt 125 lb (56.7 kg)   LMP 02/07/1999 (Approximate)   BMI 23.62 kg/m   Physical Exam  Constitutional: She is oriented to person, place, and time. She appears well-developed.  HENT:  Head: Normocephalic.  Right Ear: External ear normal.  Left Ear: External ear normal.  Eyes: Pupils are equal, round, and reactive to light.  Neck: No tracheal deviation present. No thyromegaly present.  Cardiovascular: Normal rate.   Pulmonary/Chest: Effort normal.  Abdominal: Soft.  Neurological: She is alert and oriented to person, place, and time.  Skin: Skin is warm and dry.  Psychiatric: She has a normal mood and affect. Her behavior is normal.    Ortho Exam no pain with hip range of motion A Mr. leg raising. She has pain with the last 10 of extension tender Baker's cyst which has recurred. 3-4+ knee synovitis. Lateral recently exam is normal. Anterolateral joint line tenderness. Distal pulses are intact normal range of motion. She is a mature the right knee left.  Specialty Comments:  No specialty comments  available.  Imaging:   Study Result   CLINICAL DATA:  Right knee pain for 4 months.  EXAM: MRI OF THE RIGHT KNEE WITHOUT CONTRAST  TECHNIQUE: Multiplanar, multisequence MR imaging of the knee was performed. No intravenous contrast was administered.  COMPARISON:  Radiographs dated 08/02/2016  FINDINGS: MENISCI  Medial meniscus:  Normal.  Lateral meniscus: Inhomogeneous diffuse irregular signal from the anterior horn consistent with a complex tear. Posterior horn  is normal.  LIGAMENTS  Cruciates:  Intact ACL and PCL.  Collaterals: Medial collateral ligament is intact. Lateral collateral ligament complex is intact.  CARTILAGE  Patellofemoral: Focal full-thickness cartilage loss of the apex and medial facet of the patella.  Medial:  Diffuse partial thickness cartilage loss.  Lateral: Full-thickness cartilage loss on the tibial plateau. There is a partial and full-thickness cartilage loss on the femoral condyle.  Joint: Large joint effusion. Calcified 6 mm loose body posterior to the root of the posterior horn of the medial meniscus.  Popliteal Fossa: 6 x 3 x 1.2 cm Baker's cyst. Popliteus tendon is intact.  Extensor Mechanism:  Intact quadriceps tendon and patellar tendon.  Bones:  Minimal tricompartmental marginal osteophytes.  Other: None  IMPRESSION: 1. Complex tear of the anterior horn of the lateral meniscus. 2. Extensive cartilage loss in the lateral compartment. Mild cartilage loss of the patellofemoral compartment. 3. Large joint effusion.  Loose body in the joint.  Baker's cyst.   Electronically Signed   By: Lorriane Shire M.D.          PMFS History: Patient Active Problem List   Diagnosis Date Noted  . Chronic pain of right knee 11/07/2016  . Recurrent UTI (urinary tract infection) 03/02/2016  . Torn rotator cuff 03/02/2016  . Essential hypertension 03/02/2016  . HLD (hyperlipidemia) 03/02/2016  . Hematuria 03/02/2016  . 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 Pancreatic cyst 08/28/2014  . Hepatic cyst 08/28/2014   Past Medical History:  Diagnosis Date  . Allergy   . Anxiety   . Atrial fibrillation ([place])    ??? has never seen cardiologist per patient  . DDD (degenerative disc disease), lumbar   . Hypercholesteremia   . Hypertension     Family History  Problem Relation Age of Onset  . Lung cancer Mother           . Hypertension Mother   . Cancer Mother   . Diabetes Father   .  Hypertension Father   . Lung cancer Father        deceased age 73  . Cancer Father   . Colon cancer Sister        deceased 05-15-14, age 29  . Pancreatic cancer Neg Hx     Past Surgical History:  Procedure Laterality Date  . COLONOSCOPY  2014/05/15   Dr.Benson- normal colonoscopy, retroflexed views revealed no abnormalities.   . ESOPHAGOGASTRODUODENOSCOPY  2014/05/15   Dr.Benson- normal EGD, retroflexed views revealed no abnormalities   . EUS N/A 09/24/2014   Procedure: UPPER ENDOSCOPIC ULTRASOUND (EUS) RADIAL;  Surgeon: Milus Banister, MD;  Location: [facility];  Service: Endoscopy;  Laterality: N/A;  . none    . 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 Smoking status: Never Smoker  . Smokeless tobacco: Never Used  . Alcohol use No  . Drug use: No  . Sexual activity: Yes  Birth control/ protection: None, Post-menopausal

## 2016-11-15 NOTE — Telephone Encounter (Signed)
Patient called wanting to speak with you about her knee, she said its very urgent. CB # [phone]

## 2016-11-16 ENCOUNTER — Telehealth: Payer: Self-pay | Admitting: Family Medicine

## 2016-11-16 ENCOUNTER — Encounter (INDEPENDENT_AMBULATORY_CARE_PROVIDER_SITE_OTHER): Payer: Self-pay | Admitting: Orthopaedic Surgery

## 2016-11-16 ENCOUNTER — Ambulatory Visit (INDEPENDENT_AMBULATORY_CARE_PROVIDER_SITE_OTHER): Payer: Medicare Other | Admitting: Orthopaedic Surgery

## 2016-11-16 VITALS — BP 139/90 | HR 106

## 2016-11-16 DIAGNOSIS — S83271D Complex tear of lateral meniscus, current injury, right knee, subsequent encounter: Secondary | ICD-10-CM | POA: Diagnosis not present

## 2016-11-16 MED ORDER — LIDOCAINE HCL 1 % IJ SOLN
1.0000 mL | INTRAMUSCULAR | Status: AC | PRN
  Administered 2016-11-16: 1 mL

## 2016-11-16 NOTE — Telephone Encounter (Signed)
Patient is requesting to pickup a printed copy of her last labs from Oct.2    Cb#: [phone]

## 2016-11-16 NOTE — Telephone Encounter (Signed)
I left voicemail for patient, ok for aspiration. She will call back to schedule appt in [place] office today.

## 2016-11-16 NOTE — Progress Notes (Signed)
Office Visit Note   Patient: Crystal Cooke           Date of Birth: 09/05/1950           MRN: [id number] Visit Date: 11/16/2016              Requested by: Raylene Everts, MD [phone] [place], [place] [address] PCP: Raylene Everts, MD   Assessment & Plan: Visit Diagnoses:  1. Complex tear of lateral meniscus of right knee, unspecified whether old or current tear, subsequent encounter     Plan: The aspiration 40 mL clear yellow fluid done for acute synovitis. She is working on getting family members available so that she can proceed with outpatient surgery and have them there with her after the procedure.  Follow-Up Instructions: No Follow-up on file.   Orders:  Orders Placed This Encounter  Procedures  . Large Joint Injection/Arthrocentesis   No orders of the defined types were placed in this encounter.     Procedures: Large Joint Inj Date/Time: 11/16/2016 10:17 AM Performed by: Marybelle Killings Authorized by: Marybelle Killings   Consent Given by:  Patient Indications:  Pain and joint swelling Location:  Knee Site:  R knee Prep: patient was prepped and draped in usual sterile fashion   Needle Size:  22 G Needle Length:  1.5 inches Approach:  Superolateral Ultrasound Guidance: No   Fluoroscopic Guidance: No   Arthrogram: No   Medications:  1 mL lidocaine 1 % Aspiration Attempted: Yes   Aspirate amount (mL):  40 Aspirate:  Clear and yellow Patient tolerance:  Patient tolerated the procedure well with no immediate complications     Clinical Data: No additional findings.   Subjective: Chief Complaint  Patient presents with  . Right Knee - Pain    Right knee aspiration    HPI patient was scheduled for knee arthroscopy but does not have family available to be with her and had canceled surgery. Her knee is swollen tight painful and she came in today after calling requesting an aspiration.  Review of Systems unchanged from last office  visit   Objective: Vital Signs: BP 139/90   Pulse (!) 106   LMP 02/07/1999 (Approximate)   Physical Exam  Constitutional: She is oriented to person, place, and time. She appears well-developed.  HENT:  Head: Normocephalic.  Right Ear: External ear normal.  Left Ear: External ear normal.  Eyes: Pupils are equal, round, and reactive to light.  Neck: No tracheal deviation present. No thyromegaly present.  Cardiovascular: Normal rate.   Pulmonary/Chest: Effort normal.  Abdominal: Soft.  Neurological: She is alert and oriented to person, place, and time.  Skin: Skin is warm and dry.  Psychiatric: She has a normal mood and affect. Her behavior is normal.    Ortho Exam 4+ right knee effusion tenderness with palpation. She lacks 5-10 wrist extension secondary to pain and swelling and can only flex to 90. No cellulitis normal hip range of motion.  Specialty Comments:  No specialty comments available.  Imaging: No results found.   PMFS History: Patient Active Problem List   Diagnosis Date Noted  . Chronic pain of right knee 11/07/2016  . Recurrent UTI (urinary tract infection) 03/02/2016  . Torn rotator cuff 03/02/2016  . Essential hypertension 03/02/2016  . HLD (hyperlipidemia) 03/02/2016  . Hematuria 03/02/2016  . 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 Pancreatic cyst 08/28/2014  . Hepatic cyst 08/28/2014   Past Medical History:  Diagnosis Date  . Allergy   . Anxiety   . Atrial fibrillation ([place])    ??? has never seen cardiologist per patient  . DDD (degenerative disc disease), lumbar   . Hypercholesteremia   . Hypertension     Family History  Problem Relation Age of Onset  . Lung cancer Mother           . Hypertension Mother   . Cancer Mother   . Diabetes Father   . Hypertension Father   . Lung cancer Father        deceased age 32  . Cancer Father   . Colon cancer Sister        deceased 2014/05/19, age 62  . Pancreatic cancer Neg Hx     Past Surgical History:   Procedure Laterality Date  . COLONOSCOPY  19-May-2014   Dr.Benson- normal colonoscopy, retroflexed views revealed no abnormalities.   . ESOPHAGOGASTRODUODENOSCOPY  May 19, 2014   Dr.Benson- normal EGD, retroflexed views revealed no abnormalities   . EUS N/A 09/24/2014   Procedure: UPPER ENDOSCOPIC ULTRASOUND (EUS) RADIAL;  Surgeon: Milus Banister, MD;  Location: [facility];  Service: Endoscopy;  Laterality: N/A;  . none    . 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 Smoking status: Never Smoker  . Smokeless tobacco: Never Used  . Alcohol use No  . Drug use: No  . Sexual activity: Yes    Birth control/ protection: None, Post-menopausal

## 2016-11-16 NOTE — Telephone Encounter (Signed)
A letter with her lab results were mailed on 10 4 18

## 2016-11-16 NOTE — Telephone Encounter (Signed)
OK come in for aspiration thanks

## 2016-11-16 NOTE — Telephone Encounter (Signed)
Spoke to Manjit, will be picking up a copy of her labs tomorrow.

## 2016-11-20 ENCOUNTER — Telehealth: Payer: Self-pay | Admitting: Family Medicine

## 2016-11-24 ENCOUNTER — Telehealth (INDEPENDENT_AMBULATORY_CARE_PROVIDER_SITE_OTHER): Payer: Self-pay | Admitting: Orthopaedic Surgery

## 2016-11-24 NOTE — Telephone Encounter (Signed)
Patient called to schedule surgery with Dr. Lorin Mercy as soon as possible. CB # [phone]

## 2016-11-28 DIAGNOSIS — K5900 Constipation, unspecified: Secondary | ICD-10-CM | POA: Diagnosis not present

## 2016-11-28 DIAGNOSIS — K7689 Other specified diseases of liver: Secondary | ICD-10-CM | POA: Diagnosis not present

## 2016-11-28 DIAGNOSIS — K862 Cyst of pancreas: Secondary | ICD-10-CM | POA: Diagnosis not present

## 2016-11-28 DIAGNOSIS — N2889 Other specified disorders of kidney and ureter: Secondary | ICD-10-CM | POA: Diagnosis not present

## 2016-11-28 DIAGNOSIS — Z90411 Acquired partial absence of pancreas: Secondary | ICD-10-CM | POA: Diagnosis not present

## 2016-11-28 DIAGNOSIS — K869 Disease of pancreas, unspecified: Secondary | ICD-10-CM | POA: Diagnosis not present

## 2016-11-28 DIAGNOSIS — Z48815 Encounter for surgical aftercare following surgery on the digestive system: Secondary | ICD-10-CM | POA: Diagnosis not present

## 2016-11-28 DIAGNOSIS — N289 Disorder of kidney and ureter, unspecified: Secondary | ICD-10-CM | POA: Diagnosis not present

## 2016-11-28 DIAGNOSIS — Z8719 Personal history of other diseases of the digestive system: Secondary | ICD-10-CM | POA: Diagnosis not present

## 2016-12-11 ENCOUNTER — Ambulatory Visit: Payer: Medicare Other | Admitting: Family Medicine

## 2016-12-11 ENCOUNTER — Ambulatory Visit: Payer: Medicare Other | Admitting: Podiatry

## 2016-12-12 ENCOUNTER — Ambulatory Visit: Payer: Medicare Other | Admitting: Family Medicine

## 2016-12-14 ENCOUNTER — Ambulatory Visit (INDEPENDENT_AMBULATORY_CARE_PROVIDER_SITE_OTHER): Payer: Medicare Other | Admitting: Orthopaedic Surgery

## 2016-12-14 ENCOUNTER — Encounter (INDEPENDENT_AMBULATORY_CARE_PROVIDER_SITE_OTHER): Payer: Self-pay | Admitting: Orthopaedic Surgery

## 2016-12-14 VITALS — BP 140/76 | HR 90

## 2016-12-14 DIAGNOSIS — S83281D Other tear of lateral meniscus, current injury, right knee, subsequent encounter: Secondary | ICD-10-CM | POA: Diagnosis not present

## 2016-12-14 NOTE — Progress Notes (Signed)
Office Visit Note   Patient: Crystal Cooke           Date of Birth: 02-10-1950           MRN: [id number] Visit Date: 12/14/2016              Requested by: Raylene Everts, MD [phone] [place], [place] [address] PCP: Raylene Everts, MD   Assessment & Plan: Visit Diagnoses:  1. Tear of lateral meniscus of right knee, current, unspecified tear type, subsequent encounter     Plan: Aspiration of her knee performed 35 cc removed she had pain relief with the aspiration.  We will check her back again to see her at the date of her scheduled surgery.  Follow-Up Instructions: No Follow-up on file.   Orders:  Orders Placed This Encounter  Procedures  . Large Joint Inj   No orders of the defined types were placed in this encounter.     Procedures: Large Joint Inj: R knee on 12/14/2016 11:40 AM Indications: pain and joint swelling Details: 22 G 1.5 in needle, anterolateral approach  Arthrogram: No  Medications: 0.5 mL lidocaine 1 % Aspirate: 35 mL yellow Outcome: tolerated well, no immediate complications Procedure, treatment alternatives, risks and benefits explained, specific risks discussed. Consent was given by the patient. Immediately prior to procedure a time out was called to verify the correct patient, procedure, equipment, support staff and site/side marked as required. Patient was prepped and draped in the usual sterile fashion.       Clinical Data: No additional findings.   Subjective: Chief Complaint  Patient presents with  . Right Knee - Pain    HPI returns with recurrent tense effusion which is painful.  She requested repeat aspiration since she is having problems walking.  She is scheduled for knee arthroscopy for the complex posterior lateral meniscal tear on November 30.  Review of Systems last office visit.   Objective: Vital Signs: BP 140/76   Pulse 90   LMP 02/07/1999 (Approximate)   Physical Exam  Constitutional: She is  oriented to person, place, and time. She appears well-developed.  HENT:  Head: Normocephalic.  Right Ear: External ear normal.  Left Ear: External ear normal.  Eyes: Pupils are equal, round, and reactive to light.  Neck: No tracheal deviation present. No thyromegaly present.  Cardiovascular: Normal rate.  Pulmonary/Chest: Effort normal.  Abdominal: Soft.  Neurological: She is alert and oriented to person, place, and time.  Skin: Skin is warm and dry.  Psychiatric: She has a normal mood and affect. Her behavior is normal.    Ortho Exam Pain with hip range of motion.  She has no increased warmth to the knee.  She does have significant knee effusion.  Pain with range of motion.  Pain in the popliteal region consistent with Baker's cyst.  Collateral ligaments are stable she has full extension and good flexion.  There is lateral joint line tenderness.  Specialty Comments:  No specialty comments available.  Imaging: No results found.   PMFS History: Patient Active Problem List   Diagnosis Date Noted  . Chronic pain of right knee 11/07/2016  . Recurrent UTI (urinary tract infection) 03/02/2016  . Torn rotator cuff 03/02/2016  . Essential hypertension 03/02/2016  . HLD (hyperlipidemia) 03/02/2016  . Hematuria 03/02/2016  . 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 Pancreatic cyst 08/28/2014  . Hepatic cyst 08/28/2014   Past Medical History:  Diagnosis Date  . Allergy   .  Anxiety   . Atrial fibrillation ([place])    ??? has never seen cardiologist per patient  . DDD (degenerative disc disease), lumbar   . Hypercholesteremia   . Hypertension     Family History  Problem Relation Age of Onset  . Lung cancer Mother           . Hypertension Mother   . Cancer Mother   . Diabetes Father   . Hypertension Father   . Lung cancer Father        deceased age 37  . Cancer Father   . Colon cancer Sister        deceased 05-12-14, age 40  . Pancreatic cancer Neg Hx     Past Surgical History:    Procedure Laterality Date  . COLONOSCOPY  05/12/2014   Dr.Benson- normal colonoscopy, retroflexed views revealed no abnormalities.   . ESOPHAGOGASTRODUODENOSCOPY  05/12/2014   Dr.Benson- normal EGD, retroflexed views revealed no abnormalities   . none    . PANCREAS SURGERY     partial   Social History   Occupational History  . 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 Smoking status: Never Smoker  . Smokeless tobacco: Never Used  Substance and Sexual Activity  . Alcohol use: No  . Drug use: No  . Sexual activity: Yes    Birth control/protection: None, Post-menopausal

## 2016-12-19 ENCOUNTER — Ambulatory Visit: Payer: Medicare Other | Admitting: Podiatry

## 2016-12-20 ENCOUNTER — Ambulatory Visit: Payer: Medicare Other | Admitting: Family Medicine

## 2016-12-25 ENCOUNTER — Encounter (HOSPITAL_BASED_OUTPATIENT_CLINIC_OR_DEPARTMENT_OTHER): Payer: Self-pay | Admitting: *Deleted

## 2016-12-25 ENCOUNTER — Encounter (INDEPENDENT_AMBULATORY_CARE_PROVIDER_SITE_OTHER): Payer: Self-pay | Admitting: Orthopaedic Surgery

## 2016-12-25 MED ORDER — LIDOCAINE HCL 1 % IJ SOLN
0.5000 mL | INTRAMUSCULAR | Status: AC | PRN
  Administered 2016-12-14: .5 mL

## 2016-12-26 ENCOUNTER — Ambulatory Visit (INDEPENDENT_AMBULATORY_CARE_PROVIDER_SITE_OTHER): Payer: Medicare Other | Admitting: Podiatry

## 2016-12-26 ENCOUNTER — Encounter: Payer: Self-pay | Admitting: Podiatry

## 2016-12-26 DIAGNOSIS — L603 Nail dystrophy: Secondary | ICD-10-CM | POA: Diagnosis not present

## 2016-12-31 NOTE — Progress Notes (Signed)
   HPI: 66 year old female presenting today as a new patient with a chief complaint of discoloration of all toenails bilaterally. She has not done anything to treat the symptoms. There are no modifying factors noted. She denies any pain. Patient is here for further evaluation and treatment.    Past Medical History:  Diagnosis Date  . Allergy   . Anxiety   . Atrial fibrillation ([place])    h/o a fib,  NSR with last EKG, has never seen a cardiologist per pt   . DDD (degenerative disc disease), lumbar   . Hypercholesteremia   . Hypertension      Physical Exam: General: The patient is alert and oriented x3 in no acute distress.  Dermatology: Skin is warm, dry and supple bilateral lower extremities. Negative for open lesions or macerations. Dark, symmetrical streaking of likely melanin pigment to nails 1-5 bilaterally.   Vascular: Palpable pedal pulses bilaterally. No edema or erythema noted. Capillary refill within normal limits.  Neurological: Epicritic and protective threshold grossly intact bilaterally.   Musculoskeletal Exam: Range of motion within normal limits to all pedal and ankle joints bilateral. Muscle strength 5/5 in all groups bilateral.    Assessment: - discolored toenails 1-5 bilaterally   Plan of Care:  - Patient evaluated. - Recommended good shoe gear. - Explained to patient that her symptoms are likely non malignant.  - Return to clinic when necessary.   Edrick Kins, DPM [facility]  Dr. Edrick Kins, DPM    [address],  [address]                Office [phone]  Fax [phone]

## 2017-01-02 ENCOUNTER — Encounter (HOSPITAL_COMMUNITY)
Admission: RE | Admit: 2017-01-02 | Discharge: 2017-01-02 | Disposition: A | Discharge status: Home / Self Care | Payer: Medicare Other | Source: Ambulatory Visit | Attending: Orthopaedic Surgery | Admitting: Orthopaedic Surgery

## 2017-01-02 DIAGNOSIS — X58XXXA Exposure to other specified factors, initial encounter: Secondary | ICD-10-CM | POA: Diagnosis not present

## 2017-01-02 DIAGNOSIS — Z8249 Family history of ischemic heart disease and other diseases of the circulatory system: Secondary | ICD-10-CM | POA: Diagnosis not present

## 2017-01-02 DIAGNOSIS — Z01818 Encounter for other preprocedural examination: Secondary | ICD-10-CM | POA: Insufficient documentation

## 2017-01-02 DIAGNOSIS — Z800 Family history of malignant neoplasm of digestive organs: Secondary | ICD-10-CM | POA: Diagnosis not present

## 2017-01-02 DIAGNOSIS — I4891 Unspecified atrial fibrillation: Secondary | ICD-10-CM | POA: Diagnosis not present

## 2017-01-02 DIAGNOSIS — I10 Essential (primary) hypertension: Secondary | ICD-10-CM

## 2017-01-02 DIAGNOSIS — Z888 Allergy status to other drugs, medicaments and biological substances status: Secondary | ICD-10-CM | POA: Diagnosis not present

## 2017-01-02 DIAGNOSIS — Z01810 Encounter for preprocedural cardiovascular examination: Secondary | ICD-10-CM | POA: Diagnosis not present

## 2017-01-02 DIAGNOSIS — F419 Anxiety disorder, unspecified: Secondary | ICD-10-CM | POA: Diagnosis not present

## 2017-01-02 DIAGNOSIS — Z7982 Long term (current) use of aspirin: Secondary | ICD-10-CM | POA: Diagnosis not present

## 2017-01-02 DIAGNOSIS — Z79899 Other long term (current) drug therapy: Secondary | ICD-10-CM | POA: Diagnosis not present

## 2017-01-02 DIAGNOSIS — Z801 Family history of malignant neoplasm of trachea, bronchus and lung: Secondary | ICD-10-CM | POA: Diagnosis not present

## 2017-01-02 DIAGNOSIS — R9431 Abnormal electrocardiogram [ECG] [EKG]: Secondary | ICD-10-CM | POA: Insufficient documentation

## 2017-01-02 DIAGNOSIS — Z808 Family history of malignant neoplasm of other organs or systems: Secondary | ICD-10-CM | POA: Diagnosis not present

## 2017-01-02 DIAGNOSIS — Z01812 Encounter for preprocedural laboratory examination: Secondary | ICD-10-CM | POA: Diagnosis not present

## 2017-01-02 DIAGNOSIS — S83281A Other tear of lateral meniscus, current injury, right knee, initial encounter: Secondary | ICD-10-CM | POA: Diagnosis not present

## 2017-01-02 DIAGNOSIS — Z833 Family history of diabetes mellitus: Secondary | ICD-10-CM | POA: Diagnosis not present

## 2017-01-02 DIAGNOSIS — Z881 Allergy status to other antibiotic agents status: Secondary | ICD-10-CM | POA: Diagnosis not present

## 2017-01-02 DIAGNOSIS — E7800 Pure hypercholesterolemia, unspecified: Secondary | ICD-10-CM | POA: Diagnosis not present

## 2017-01-02 LAB — BASIC METABOLIC PANEL
Anion gap: 9 (ref 5–15)
BUN: 14 mg/dL (ref 6–20)
CALCIUM: 9.3 mg/dL (ref 8.9–10.3)
CHLORIDE: 102 mmol/L (ref 101–111)
CO2: 27 mmol/L (ref 22–32)
CREATININE: 0.87 mg/dL (ref 0.44–1.00)
GFR calc Af Amer: >60 mL/min (ref >60)
GFR calc non Af Amer: >60 mL/min (ref >60)
GLUCOSE: 95 mg/dL (ref 65–99)
Potassium: 3.2 mmol/L — ABNORMAL LOW (ref 3.5–5.1)
SODIUM: 138 mmol/L (ref 135–145)

## 2017-01-02 LAB — SURGICAL PCR SCREEN
MRSA, PCR: NEGATIVE
Staphylococcus aureus: NEGATIVE

## 2017-01-04 NOTE — Progress Notes (Signed)
Labs and EKG reviewed by Dr. [name], will proceed with surgery as scheduled.

## 2017-01-04 NOTE — H&P (Signed)
Crystal Cooke is an 66 y.o. female.   Chief Complaint: Right knee pain and mechanical symptoms HPI: Patient with history of right knee lateral meniscal tear and the above complaint presents for surgical intervention. Progressively worsening symptoms. Failed conservative treatment.  Past Medical History:  Diagnosis Date  . Allergy   . Anxiety   . Atrial fibrillation ([place])    h/o a fib,  NSR with last EKG, has never seen a cardiologist per pt   . DDD (degenerative disc disease), lumbar   . Hypercholesteremia   . Hypertension     Past Surgical History:  Procedure Laterality Date  . COLONOSCOPY  2014-05-30   Dr.Benson- normal colonoscopy, retroflexed views revealed no abnormalities.   . ESOPHAGOGASTRODUODENOSCOPY  30-May-2014   Dr.Benson- normal EGD, retroflexed views revealed no abnormalities   . EUS N/A 09/24/2014   Procedure: UPPER ENDOSCOPIC ULTRASOUND (EUS) RADIAL;  Surgeon: Milus Banister, MD;  Location: [facility];  Service: Endoscopy;  Laterality: N/A;  . none    . PANCREAS SURGERY     partial    Family History  Problem Relation Age of Onset  . Lung cancer Mother           . Hypertension Mother   . Cancer Mother   . Diabetes Father   . Hypertension Father   . Lung cancer Father        deceased age 59  . Cancer Father   . Colon cancer Sister        deceased 05-30-14, age 63  . Pancreatic cancer Neg Hx    Social History:  reports that  has never smoked. she has never used smokeless tobacco. She reports that she does not drink alcohol or use drugs.  Allergies:  Allergies  Allergen Reactions  . Levaquin [Levofloxacin] Other (See Comments)    "tendon damage"  . Statins Other (See Comments)    "takes the taste out of my mouth    No medications prior to admission.    No results found for this or any previous visit (from the past 48 hour(s)). No results found.  Review of Systems  Constitutional: Negative.   HENT: Negative.   Respiratory: Negative.    Cardiovascular: Negative.   Genitourinary: Negative.   Musculoskeletal: Positive for joint pain.  Skin: Negative.   Neurological: Negative.   Psychiatric/Behavioral: Negative.     Height 5\' 1"  (1.549 m), weight 125 lb (56.7 kg), last menstrual period 02/07/1999. Physical Exam  Constitutional: She is oriented to person, place, and time. No distress.  HENT:  Head: Normocephalic and atraumatic.  Eyes: EOM are normal. Pupils are equal, round, and reactive to light.  Neck: Normal range of motion.  Respiratory: Effort normal and breath sounds normal.  GI: She exhibits no distension.  Musculoskeletal:  Gait antalgic. Right knee joint line tender. Positive McMurray's. Ligaments are stable.  Neurological: She is alert and oriented to person, place, and time.  Skin: Skin is warm and dry.  Psychiatric: She has a normal mood and affect.     Assessment/Plan Right knee pain, lateral meniscal tear, chondromalacia.  We'll proceed with right knee arthroscopy with debridement as scheduled. Surgical procedure along with possible risks and complications discussed. All questions answered and wishes to proceed.  Benjiman Core, PA-C 01/04/2017, 5:14 PM

## 2017-01-05 ENCOUNTER — Encounter (HOSPITAL_BASED_OUTPATIENT_CLINIC_OR_DEPARTMENT_OTHER): Payer: Self-pay | Admitting: *Deleted

## 2017-01-05 ENCOUNTER — Ambulatory Visit (HOSPITAL_BASED_OUTPATIENT_CLINIC_OR_DEPARTMENT_OTHER): Payer: Medicare Other | Admitting: Anesthesiology

## 2017-01-05 ENCOUNTER — Other Ambulatory Visit: Payer: Self-pay

## 2017-01-05 ENCOUNTER — Encounter (HOSPITAL_BASED_OUTPATIENT_CLINIC_OR_DEPARTMENT_OTHER)
Admission: RE | Disposition: A | Discharge status: Home / Self Care | Payer: Self-pay | Source: Ambulatory Visit | Attending: Orthopaedic Surgery

## 2017-01-05 ENCOUNTER — Ambulatory Visit (HOSPITAL_BASED_OUTPATIENT_CLINIC_OR_DEPARTMENT_OTHER)
Admission: RE | Admit: 2017-01-05 | Discharge: 2017-01-05 | Disposition: A | Discharge status: Home / Self Care | Payer: Medicare Other | Source: Ambulatory Visit | Attending: Orthopaedic Surgery | Admitting: Orthopaedic Surgery

## 2017-01-05 DIAGNOSIS — Z808 Family history of malignant neoplasm of other organs or systems: Secondary | ICD-10-CM | POA: Diagnosis not present

## 2017-01-05 DIAGNOSIS — Z01810 Encounter for preprocedural cardiovascular examination: Secondary | ICD-10-CM | POA: Diagnosis not present

## 2017-01-05 DIAGNOSIS — S83281D Other tear of lateral meniscus, current injury, right knee, subsequent encounter: Secondary | ICD-10-CM | POA: Diagnosis not present

## 2017-01-05 DIAGNOSIS — X58XXXA Exposure to other specified factors, initial encounter: Secondary | ICD-10-CM | POA: Insufficient documentation

## 2017-01-05 DIAGNOSIS — Z7982 Long term (current) use of aspirin: Secondary | ICD-10-CM | POA: Insufficient documentation

## 2017-01-05 DIAGNOSIS — F419 Anxiety disorder, unspecified: Secondary | ICD-10-CM | POA: Insufficient documentation

## 2017-01-05 DIAGNOSIS — Z833 Family history of diabetes mellitus: Secondary | ICD-10-CM | POA: Diagnosis not present

## 2017-01-05 DIAGNOSIS — S83281A Other tear of lateral meniscus, current injury, right knee, initial encounter: Secondary | ICD-10-CM | POA: Insufficient documentation

## 2017-01-05 DIAGNOSIS — Z800 Family history of malignant neoplasm of digestive organs: Secondary | ICD-10-CM | POA: Diagnosis not present

## 2017-01-05 DIAGNOSIS — M2341 Loose body in knee, right knee: Secondary | ICD-10-CM | POA: Diagnosis not present

## 2017-01-05 DIAGNOSIS — M94261 Chondromalacia, right knee: Secondary | ICD-10-CM | POA: Diagnosis not present

## 2017-01-05 DIAGNOSIS — E7800 Pure hypercholesterolemia, unspecified: Secondary | ICD-10-CM | POA: Diagnosis not present

## 2017-01-05 DIAGNOSIS — Z801 Family history of malignant neoplasm of trachea, bronchus and lung: Secondary | ICD-10-CM | POA: Insufficient documentation

## 2017-01-05 DIAGNOSIS — I4891 Unspecified atrial fibrillation: Secondary | ICD-10-CM | POA: Insufficient documentation

## 2017-01-05 DIAGNOSIS — Z888 Allergy status to other drugs, medicaments and biological substances status: Secondary | ICD-10-CM | POA: Insufficient documentation

## 2017-01-05 DIAGNOSIS — I10 Essential (primary) hypertension: Secondary | ICD-10-CM | POA: Insufficient documentation

## 2017-01-05 DIAGNOSIS — M25561 Pain in right knee: Secondary | ICD-10-CM | POA: Diagnosis not present

## 2017-01-05 DIAGNOSIS — Z01812 Encounter for preprocedural laboratory examination: Secondary | ICD-10-CM | POA: Insufficient documentation

## 2017-01-05 DIAGNOSIS — Z881 Allergy status to other antibiotic agents status: Secondary | ICD-10-CM | POA: Insufficient documentation

## 2017-01-05 DIAGNOSIS — Z8249 Family history of ischemic heart disease and other diseases of the circulatory system: Secondary | ICD-10-CM | POA: Insufficient documentation

## 2017-01-05 DIAGNOSIS — Z79899 Other long term (current) drug therapy: Secondary | ICD-10-CM | POA: Insufficient documentation

## 2017-01-05 HISTORY — PX: KNEE ARTHROSCOPY: SHX127

## 2017-01-05 SURGERY — ARTHROSCOPY, KNEE
Anesthesia: General | Site: Knee | Laterality: Right

## 2017-01-05 MED ORDER — BUPIVACAINE-EPINEPHRINE (PF) 0.25% -1:200000 IJ SOLN
INTRAMUSCULAR | Status: AC
  Filled 2017-01-05: qty 30

## 2017-01-05 MED ORDER — PROMETHAZINE HCL 25 MG/ML IJ SOLN: 6.2500 mg | INTRAMUSCULAR | Status: DC | PRN

## 2017-01-05 MED ORDER — LIDOCAINE HCL (CARDIAC) 20 MG/ML IV SOLN
INTRAVENOUS | Status: DC | PRN
  Administered 2017-01-05: 60 mg via INTRAVENOUS

## 2017-01-05 MED ORDER — CEFAZOLIN SODIUM-DEXTROSE 2-4 GM/100ML-% IV SOLN
2.0000 g | INTRAVENOUS | Status: AC
  Administered 2017-01-05: 2 g via INTRAVENOUS

## 2017-01-05 MED ORDER — CEFAZOLIN SODIUM-DEXTROSE 2-4 GM/100ML-% IV SOLN
INTRAVENOUS | Status: AC
  Filled 2017-01-05: qty 100

## 2017-01-05 MED ORDER — MEPERIDINE HCL 25 MG/ML IJ SOLN: 6.2500 mg | INTRAMUSCULAR | Status: DC | PRN

## 2017-01-05 MED ORDER — BUPIVACAINE HCL (PF) 0.25 % IJ SOLN
INTRAMUSCULAR | Status: AC
  Filled 2017-01-05: qty 30

## 2017-01-05 MED ORDER — FENTANYL CITRATE (PF) 100 MCG/2ML IJ SOLN
INTRAMUSCULAR | Status: AC
  Filled 2017-01-05: qty 2

## 2017-01-05 MED ORDER — OXYCODONE HCL 5 MG/5ML PO SOLN: 5.0000 mg | Freq: Once | ORAL | Status: DC | PRN

## 2017-01-05 MED ORDER — SODIUM CHLORIDE 0.9 % IR SOLN
Status: DC | PRN
  Administered 2017-01-05: 3000 mL

## 2017-01-05 MED ORDER — LIDOCAINE HCL (PF) 1 % IJ SOLN
INTRAMUSCULAR | Status: AC
  Filled 2017-01-05: qty 30

## 2017-01-05 MED ORDER — OXYCODONE HCL 5 MG PO TABS: 5.0000 mg | ORAL_TABLET | Freq: Once | ORAL | Status: DC | PRN

## 2017-01-05 MED ORDER — HYDROMORPHONE HCL 1 MG/ML IJ SOLN
0.2500 mg | INTRAMUSCULAR | Status: DC | PRN
  Administered 2017-01-05 (×2): 0.25 mg via INTRAVENOUS

## 2017-01-05 MED ORDER — HYDROMORPHONE HCL 1 MG/ML IJ SOLN
INTRAMUSCULAR | Status: AC
  Filled 2017-01-05: qty 0.5

## 2017-01-05 MED ORDER — CHLORHEXIDINE GLUCONATE 4 % EX LIQD: 60.0000 mL | Freq: Once | CUTANEOUS | Status: DC

## 2017-01-05 MED ORDER — BUPIVACAINE-EPINEPHRINE (PF) 0.5% -1:200000 IJ SOLN
INTRAMUSCULAR | Status: AC
  Filled 2017-01-05: qty 30

## 2017-01-05 MED ORDER — ONDANSETRON HCL 4 MG/2ML IJ SOLN
INTRAMUSCULAR | Status: DC | PRN
  Administered 2017-01-05: 4 mg via INTRAVENOUS

## 2017-01-05 MED ORDER — LACTATED RINGERS IV SOLN
INTRAVENOUS | Status: DC
  Administered 2017-01-05 (×2): via INTRAVENOUS

## 2017-01-05 MED ORDER — MIDAZOLAM HCL 2 MG/2ML IJ SOLN: 1.0000 mg | INTRAMUSCULAR | Status: DC | PRN

## 2017-01-05 MED ORDER — PROPOFOL 10 MG/ML IV BOLUS
INTRAVENOUS | Status: DC | PRN
  Administered 2017-01-05: 120 mg via INTRAVENOUS

## 2017-01-05 MED ORDER — DEXAMETHASONE SODIUM PHOSPHATE 4 MG/ML IJ SOLN
INTRAMUSCULAR | Status: DC | PRN
  Administered 2017-01-05: 10 mg via INTRAVENOUS

## 2017-01-05 MED ORDER — FENTANYL CITRATE (PF) 100 MCG/2ML IJ SOLN
50.0000 ug | INTRAMUSCULAR | Status: DC | PRN
  Administered 2017-01-05: 50 ug via INTRAVENOUS

## 2017-01-05 MED ORDER — BUPIVACAINE-EPINEPHRINE (PF) 0.5% -1:200000 IJ SOLN
INTRAMUSCULAR | Status: DC | PRN
  Administered 2017-01-05: 20 mL via PERINEURAL

## 2017-01-05 MED ORDER — SCOPOLAMINE 1 MG/3DAYS TD PT72: 1.0000 | MEDICATED_PATCH | Freq: Once | TRANSDERMAL | Status: DC | PRN

## 2017-01-05 SURGICAL SUPPLY — 34 items
BANDAGE ACE 6X5 VEL STRL LF (GAUZE/BANDAGES/DRESSINGS) ×3 IMPLANT
BENZOIN TINCTURE PRP APPL 2/3 (GAUZE/BANDAGES/DRESSINGS) ×3 IMPLANT
BLADE 4.2CUDA (BLADE) ×3 IMPLANT
BLADE CUDA 5.5 (BLADE) IMPLANT
BLADE GREAT WHITE 4.2 (BLADE) IMPLANT
BLADE GREAT WHITE 4.2MM (BLADE)
CLOSURE WOUND 1/4X4 (GAUZE/BANDAGES/DRESSINGS) ×1
DRAPE ARTHROSCOPY W/POUCH 114 (DRAPES) ×3 IMPLANT
DRSG TEGADERM 4X4.75 (GAUZE/BANDAGES/DRESSINGS) IMPLANT
DURAPREP 26ML APPLICATOR (WOUND CARE) ×3 IMPLANT
ELECT MENISCUS 165MM 90D (ELECTRODE) IMPLANT
ELECT REM PT RETURN 9FT ADLT (ELECTROSURGICAL)
ELECTRODE REM PT RTRN 9FT ADLT (ELECTROSURGICAL) IMPLANT
GAUZE SPONGE 4X4 12PLY STRL (GAUZE/BANDAGES/DRESSINGS) ×3 IMPLANT
GLOVE BIO SURGEON STRL SZ7.5 (GLOVE) ×3 IMPLANT
GLOVE BIOGEL PI IND STRL 8 (GLOVE) ×1 IMPLANT
GLOVE BIOGEL PI INDICATOR 8 (GLOVE) ×2
GOWN STRL REUS W/ TWL LRG LVL3 (GOWN DISPOSABLE) ×1 IMPLANT
GOWN STRL REUS W/ TWL XL LVL3 (GOWN DISPOSABLE) ×1 IMPLANT
GOWN STRL REUS W/TWL LRG LVL3 (GOWN DISPOSABLE) ×2
GOWN STRL REUS W/TWL XL LVL3 (GOWN DISPOSABLE) ×2
HOLDER KNEE FOAM BLUE (MISCELLANEOUS) ×3 IMPLANT
KNEE WRAP E Z 3 GEL PACK (MISCELLANEOUS) ×3 IMPLANT
MANIFOLD NEPTUNE II (INSTRUMENTS) ×3 IMPLANT
PACK ARTHROSCOPY DSU (CUSTOM PROCEDURE TRAY) ×3 IMPLANT
PACK BASIN DAY SURGERY FS (CUSTOM PROCEDURE TRAY) ×3 IMPLANT
PAD CAST 4YDX4 CTTN HI CHSV (CAST SUPPLIES) ×1 IMPLANT
PADDING CAST COTTON 4X4 STRL (CAST SUPPLIES) ×2
PENCIL BUTTON HOLSTER BLD 10FT (ELECTRODE) IMPLANT
SET ARTHROSCOPY TUBING (MISCELLANEOUS) ×2
SET ARTHROSCOPY TUBING LN (MISCELLANEOUS) ×1 IMPLANT
STRIP CLOSURE SKIN 1/4X4 (GAUZE/BANDAGES/DRESSINGS) ×2 IMPLANT
TOWEL OR 17X24 6PK STRL BLUE (TOWEL DISPOSABLE) ×3 IMPLANT
WATER STERILE IRR 1000ML POUR (IV SOLUTION) ×3 IMPLANT

## 2017-01-05 NOTE — Anesthesia Preprocedure Evaluation (Addendum)
Anesthesia Evaluation  Patient identified by MRN, date of birth, ID band Patient awake    Reviewed: Allergy & Precautions, NPO status , Patient's Chart, lab work & pertinent test results  Airway Mallampati: II  TM Distance: >3 FB Neck ROM: Full    Dental  (+) Partial Upper, Partial Lower   Pulmonary neg pulmonary ROS,    Pulmonary exam normal breath sounds clear to auscultation       Cardiovascular hypertension, Pt. on medications Normal cardiovascular exam+ dysrhythmias Atrial Fibrillation  Rhythm:Regular Rate:Normal  Question of A-fib, has never seen a cardiologist per patient. Review of old EKGs show NSR with LAE.   Neuro/Psych PSYCHIATRIC DISORDERS Anxiety negative neurological ROS     GI/Hepatic Neg liver ROS, Pancreatic cyst   Endo/Other  negative endocrine ROS  Renal/GU negative Renal ROS     Musculoskeletal  (+) Arthritis , Osteoarthritis,    Abdominal   Peds  Hematology negative hematology ROS (+)   Anesthesia Other Findings Day of surgery medications reviewed with the patient.  Reproductive/Obstetrics                             Anesthesia Physical  Anesthesia Plan  ASA: III  Anesthesia Plan: General   Post-op Pain Management:    Induction: Intravenous  PONV Risk Score and Plan: 3 and Ondansetron, Dexamethasone and Midazolam  Airway Management Planned: LMA  Additional Equipment:   Intra-op Plan:   Post-operative Plan: Extubation in OR  Informed Consent: I have reviewed the patients History and Physical, chart, labs and discussed the procedure including the risks, benefits and alternatives for the proposed anesthesia with the patient or authorized representative who has indicated his/her understanding and acceptance.   Dental advisory given  Plan Discussed with: CRNA  Anesthesia Plan Comments:        Anesthesia Quick Evaluation

## 2017-01-05 NOTE — Brief Op Note (Signed)
01/05/2017  1:18 PM  PATIENT:  Crystal Cooke  66 y.o. female  PRE-OPERATIVE DIAGNOSIS:  Right Knee Lateral Meniscal Tear, Loose Body  POST-OPERATIVE DIAGNOSIS:  Right Knee Lateral Meniscal Tear, Loose Body  PROCEDURE:  Procedure(s): RIGHT KNEE ARTHROSCOPY, PARTIAL LATERAL MENISCECTOMY, (Right)  SURGEON:  Surgeon(s) and Role:    * Marybelle Killings, MD - Primary  PHYSICIAN ASSISTANT:   ASSISTANTS: none   ANESTHESIA:   local and general  EBL:  minimal  BLOOD ADMINISTERED:none  DRAINS: none   LOCAL MEDICATIONS USED:  MARCAINE     SPECIMEN:  No Specimen  DISPOSITION OF SPECIMEN:  N/A  COUNTS:  YES  TOURNIQUET:  * Missing tourniquet times found for documented tourniquets in log: [id number] *  DICTATION: .Dragon Dictation  PLAN OF CARE: Discharge to home after PACU  PATIENT DISPOSITION:  PACU - hemodynamically stable.   Delay start of Pharmacological VTE agent (>24hrs) due to surgical blood loss or risk of bleeding: not applicable

## 2017-01-05 NOTE — Anesthesia Procedure Notes (Signed)
Procedure Name: LMA Insertion Date/Time: 01/05/2017 12:38 PM Performed by: Signe Colt, CRNA Pre-anesthesia Checklist: Patient identified, Emergency Drugs available, Suction available and Patient being monitored Patient Re-evaluated:Patient Re-evaluated prior to induction Oxygen Delivery Method: Circle system utilized Preoxygenation: Pre-oxygenation with 100% oxygen Induction Type: IV induction Ventilation: Mask ventilation without difficulty LMA: LMA inserted LMA Size: 3.0 Number of attempts: 1 Airway Equipment and Method: Bite block Placement Confirmation: positive ETCO2 Tube secured with: Tape Dental Injury: Teeth and Oropharynx as per pre-operative assessment

## 2017-01-05 NOTE — Discharge Instructions (Signed)
Ice on and off for 24-48 hours. Okay to remove ace wrap and dressing but leave tegaderm on (what looks like saran wrap stuck to your skin) Okay to shower day after surgery. Blot to dry, rewrap ace, and elevate. See Dr. Lorin Mercy in 1 week.  For Pain: Norco 5/325 # 40                 Take one-two by mouth every four to six hours as needed post op pain.   Post Anesthesia Home Care Instructions  Activity: Get plenty of rest for the remainder of the day. A responsible individual must stay with you for 24 hours following the procedure.  For the next 24 hours, DO NOT: -Drive a car -Paediatric nurse -Drink alcoholic beverages -Take any medication unless instructed by your physician -Make any legal decisions or sign important papers.  Meals: Start with liquid foods such as gelatin or soup. Progress to regular foods as tolerated. Avoid greasy, spicy, heavy foods. If nausea and/or vomiting occur, drink only clear liquids until the nausea and/or vomiting subsides. Call your physician if vomiting continues.  Special Instructions/Symptoms: Your throat may feel dry or sore from the anesthesia or the breathing tube placed in your throat during surgery. If this causes discomfort, gargle with warm salt water. The discomfort should disappear within 24 hours.  If you had a scopolamine patch placed behind your ear for the management of post- operative nausea and/or vomiting:  1. The medication in the patch is effective for 72 hours, after which it should be removed.  Wrap patch in a tissue and discard in the trash. Wash hands thoroughly with soap and water. 2. You may remove the patch earlier than 72 hours if you experience unpleasant side effects which may include dry mouth, dizziness or visual disturbances. 3. Avoid touching the patch. Wash your hands with soap and water after contact with the patch.

## 2017-01-05 NOTE — Transfer of Care (Signed)
Immediate Anesthesia Transfer of Care Note  Patient: Crystal Cooke  Procedure(s) Performed: RIGHT KNEE ARTHROSCOPY, PARTIAL LATERAL MENISCECTOMY, (Right Knee)  Patient Location: PACU  Anesthesia Type:General  Level of Consciousness: awake and patient cooperative  Airway & Oxygen Therapy: Patient Spontanous Breathing and Patient connected to face mask oxygen  Post-op Assessment: Report given to RN and Post -op Vital signs reviewed and stable  Post vital signs: Reviewed and stable  Last Vitals:  Vitals:   01/05/17 1036  BP: (!) 145/73  Pulse: 94  Resp: 18  Temp: 36.9 C  SpO2: 100%    Last Pain:  Vitals:   01/05/17 1036  TempSrc: Oral  PainSc: 6       Patients Stated Pain Goal: 3 (38/46/65 [id number])  Complications: No apparent anesthesia complications

## 2017-01-05 NOTE — Interval H&P Note (Signed)
History and Physical Interval Note:  01/05/2017 12:21 PM  [name]  has presented today for surgery, with the diagnosis of Right Knee Lateral Meniscal Tear, Loose Body  The various methods of treatment have been discussed with the patient and family. After consideration of risks, benefits and other options for treatment, the patient has consented to  Procedure(s): RIGHT KNEE ARTHROSCOPY, PARTIAL LATERAL MENISCECTOMY, REMOVAL LOOSE BODY (Right) as a surgical intervention .  The patient's history has been reviewed, patient examined, no change in status, stable for surgery.  I have reviewed the patient's chart and labs.  Questions were answered to the patient's satisfaction.     Crystal Cooke

## 2017-01-05 NOTE — Op Note (Signed)
Preop diagnosis :right knee lateral meniscal tear  Postop diagnosis: Same plus tricompartmental chondromalacia  Procedure: Diagnostic and operative arthroscopy right knee.  Partial lateral meniscectomy for resection of complex lateral meniscal tear and chondroplasty.  Surgeon: Rodell Perna MD  Anesthesia: LMA plus Marcaine local  Procedure after standard prepping and draping with the proximal leg holder.  Patient be given Ancef prophylactically usual extremity sheets drapes impervious stockinette Coban was applied timeout procedure completed and flow was placed to a superior lateral portal suprapatellar with the pump set at 70 cm.  Medial and lateral parapatellar tendon portals were used.  There is grade 3 changes in the patella and trochlear groove involving the medial facet.  Lateral compartment showed some grade 3 changes primarily posteriorly.  There is a complex tear involving the anterior portion of the lateral meniscus that extended back to the popliteal hiatus about two thirds of the radius with complex tearing with some pieces it was subluxed into the joint.  ACL PCL was normal.  Medial meniscus was normal but there was grade 3 and grade 4 changes on the medial femoral condyle with a flap tear that was catching and may have been responsible for the patient's repetitive effusion and knee pain.  4.2 good shaver was used to trim this back.  Careful inspection in the area of the MRI that showed calcified loose body behind the posterior horn of the meniscus was performed but no loose piece was visualized.  Attention was then turned to the lateral meniscus and using the shaver a partial lateral meniscectomy as well as using small straight flat baskets was used to perform partial meniscectomy near knee was thoroughly lavaged to remove any remaining cartilage fragments.  Patellofemoral area was debrided.  Knee was suctioned dry tincture benzoin Steri-Strips and Tegaderm Marcaine infiltration 4 x 4's and  soft dressing with Ace wrap x2 was applied.  Instrument count needle count was correct.

## 2017-01-05 NOTE — Anesthesia Postprocedure Evaluation (Signed)
Anesthesia Post Note  Patient: Crystal Cooke  Procedure(s) Performed: RIGHT KNEE ARTHROSCOPY, PARTIAL LATERAL MENISCECTOMY, (Right Knee)     Patient location during evaluation: PACU Anesthesia Type: General Level of consciousness: awake and alert Pain management: pain level controlled Vital Signs Assessment: post-procedure vital signs reviewed and stable Respiratory status: spontaneous breathing, nonlabored ventilation, respiratory function stable and patient connected to nasal cannula oxygen Cardiovascular status: blood pressure returned to baseline and stable Postop Assessment: no apparent nausea or vomiting Anesthetic complications: no    Last Vitals:  Vitals:   01/05/17 1355 01/05/17 1400  BP:  (!) 141/84  Pulse: 85 85  Resp: 14 10  Temp:    SpO2: 97% 100%                  Effie Berkshire

## 2017-01-08 ENCOUNTER — Encounter (HOSPITAL_BASED_OUTPATIENT_CLINIC_OR_DEPARTMENT_OTHER): Payer: Self-pay | Admitting: Orthopaedic Surgery

## 2017-01-08 ENCOUNTER — Telehealth (INDEPENDENT_AMBULATORY_CARE_PROVIDER_SITE_OTHER): Payer: Self-pay | Admitting: Radiology

## 2017-01-08 NOTE — Telephone Encounter (Signed)
Pharmacy called, asking if they can add to directions on norco, "not more than 10 tablets per day" so that patient will get the full prescription and they can stay within the guidelines for narcotic prescribing.  I advised ok to add this to directions.  FYI.

## 2017-01-11 DIAGNOSIS — N2889 Other specified disorders of kidney and ureter: Secondary | ICD-10-CM | POA: Diagnosis not present

## 2017-01-11 DIAGNOSIS — R3129 Other microscopic hematuria: Secondary | ICD-10-CM | POA: Diagnosis not present

## 2017-01-12 DIAGNOSIS — N2889 Other specified disorders of kidney and ureter: Secondary | ICD-10-CM | POA: Diagnosis not present

## 2017-01-15 ENCOUNTER — Ambulatory Visit: Payer: Medicare Other | Admitting: Family Medicine

## 2017-01-16 ENCOUNTER — Ambulatory Visit (INDEPENDENT_AMBULATORY_CARE_PROVIDER_SITE_OTHER): Payer: Medicare Other | Admitting: Orthopaedic Surgery

## 2017-01-16 ENCOUNTER — Encounter (INDEPENDENT_AMBULATORY_CARE_PROVIDER_SITE_OTHER): Payer: Self-pay | Admitting: Orthopaedic Surgery

## 2017-01-16 VITALS — BP 139/90 | HR 94 | Ht 61.0 in | Wt 125.0 lb

## 2017-01-16 DIAGNOSIS — S83281A Other tear of lateral meniscus, current injury, right knee, initial encounter: Secondary | ICD-10-CM

## 2017-01-16 DIAGNOSIS — S83104D Unspecified dislocation of right knee, subsequent encounter: Secondary | ICD-10-CM | POA: Diagnosis not present

## 2017-01-16 DIAGNOSIS — S83281D Other tear of lateral meniscus, current injury, right knee, subsequent encounter: Secondary | ICD-10-CM

## 2017-01-16 DIAGNOSIS — S83104A Unspecified dislocation of right knee, initial encounter: Secondary | ICD-10-CM | POA: Insufficient documentation

## 2017-01-16 DIAGNOSIS — S83271D Complex tear of lateral meniscus, current injury, right knee, subsequent encounter: Secondary | ICD-10-CM

## 2017-01-16 MED ORDER — PROMETHAZINE HCL 25 MG PO TABS: 25.0000 mg | ORAL_TABLET | Freq: Two times a day (BID) | ORAL | 0 refills | Status: DC

## 2017-01-16 MED ORDER — LIDOCAINE HCL 1 % IJ SOLN
0.5000 mL | INTRAMUSCULAR | Status: AC | PRN
  Administered 2017-01-16: .5 mL

## 2017-01-16 NOTE — Progress Notes (Signed)
Office Visit Note   Patient: Crystal Cooke           Date of Birth: February 07, 1950           MRN: [id number] Visit Date: 01/16/2017              Requested by: Raylene Everts, MD [phone] [place], [place] [address] PCP: Raylene Everts, MD   Assessment & Plan: Visit Diagnoses:  1. Dislocation of right knee with lateral meniscus tear, subsequent encounter   2. Complex tear of lateral meniscus of right knee, unspecified whether old or current tear, subsequent encounter        No knee dislocation, , chondromalacia Patellofemoral and lateral compartment with lateral meniscal tear. Post op from 01/05/17 arthroscopy.   Plan: Prescription for Phenergan for nausea since the Norco bothers her.  She will use the ibuprofen twice a day to help with swelling continue elevation continue ice and recheck in 2 weeks. 30 cc of serous bloody fluid taken off her knee which should help her pain.  She has a walker and crutches but was not using them today and she can use those thumb to help with her ambulation.  Recheck 2 weeks. Follow-Up Instructions: Return in about 2 weeks (around 01/30/2017).   Orders:  Orders Placed This Encounter  Procedures  . Large Joint Inj: R knee   Meds ordered this encounter  Medications  . promethazine (PHENERGAN) 25 MG tablet    Sig: Take 1 tablet (25 mg total) by mouth every 12 (twelve) hours.    Dispense:  20 tablet    Refill:  0      Procedures: Large Joint Inj: R knee on 01/16/2017 12:50 PM Indications: pain and joint swelling Details: 22 G 1.5 in needle, anterolateral approach  Arthrogram: No  Medications: 0.5 mL lidocaine 1 % Aspirate: 30 mL bloody Outcome: tolerated well, no immediate complications Procedure, treatment alternatives, risks and benefits explained, specific risks discussed. Consent was given by the patient. Immediately prior to procedure a time out was called to verify the correct patient, procedure, equipment, support staff  and site/side marked as required. Patient was prepped and draped in the usual sterile fashion.       Clinical Data: No additional findings.   Subjective: Chief Complaint  Patient presents with  . Right Knee - Routine Post Op    HPI 66 year old female returns post knee arthroscopy 01/05/2017 with chondroplasty lateral compartment, patellofemoral joint and partial lateral meniscectomy for meniscus tear that extended from the anterior horn to the popliteal hiatus.  She is very concerned about the normal amount of postoperative swelling in the knee.  She has been back to the office before the surgery several times for knee aspiration is very concerned whenever she notices any fluid in her knee.  She requested repeat aspiration which was performed.  Review of Systems UNCHANGED FROM SURGERY 01/05/17   Objective: Vital Signs: BP 139/90   Pulse 94   Ht 5\' 1"  (1.549 m)   Wt 125 lb (56.7 kg)   LMP 02/07/1999 (Approximate)   BMI 23.62 kg/m   Physical Exam see below.  Patient's afebrile BP 139/90 pulse 94 height 5 1 weight 125 Ortho Exam arthroscopic portals look good mild postoperative fluid now 11-12 days postop.  Knee aspiration performed.  Specialty Comments:  No specialty comments available.  Imaging: No results found.   PMFS History: Patient Active Problem List   Diagnosis Date Noted  . Dislocation  of right knee with lateral meniscus tear 01/16/2017  . Chronic pain of right knee 11/07/2016  . Recurrent UTI (urinary tract infection) 03/02/2016  . Torn rotator cuff 03/02/2016  . Essential hypertension 03/02/2016  . HLD (hyperlipidemia) 03/02/2016  . Hematuria 03/02/2016  . 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 Pancreatic cyst 08/28/2014  . Hepatic cyst 08/28/2014   Past Medical History:  Diagnosis Date  . Allergy   . Anxiety   . Atrial fibrillation ([place])    h/o a fib,  NSR with last EKG, has never seen a cardiologist per pt   . DDD (degenerative disc disease), lumbar   .  Hypercholesteremia   . Hypertension     Family History  Problem Relation Age of Onset  . Lung cancer Mother           . Hypertension Mother   . Cancer Mother   . Diabetes Father   . Hypertension Father   . Lung cancer Father        deceased age 11  . Cancer Father   . Colon cancer Sister        deceased 2014/05/19, age 37  . Pancreatic cancer Neg Hx     Past Surgical History:  Procedure Laterality Date  . COLONOSCOPY  05-19-14   Dr.Benson- normal colonoscopy, retroflexed views revealed no abnormalities.   . ESOPHAGOGASTRODUODENOSCOPY  05-19-2014   Dr.Benson- normal EGD, retroflexed views revealed no abnormalities   . EUS N/A 09/24/2014   Procedure: UPPER ENDOSCOPIC ULTRASOUND (EUS) RADIAL;  Surgeon: Milus Banister, MD;  Location: [facility];  Service: Endoscopy;  Laterality: N/A;  . KNEE ARTHROSCOPY Right 01/05/2017   Procedure: RIGHT KNEE ARTHROSCOPY, PARTIAL LATERAL MENISCECTOMY,;  Surgeon: Marybelle Killings, MD;  Location: [facility];  Service: Orthopedics;  Laterality: Right;  . none    . PANCREAS SURGERY     partial   Social History   Occupational History  . 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 Smoking status: Never Smoker  . Smokeless tobacco: Never Used  Substance and Sexual Activity  . Alcohol use: No  . Drug use: No  . Sexual activity: Yes    Birth control/protection: None, Post-menopausal

## 2017-01-22 ENCOUNTER — Ambulatory Visit: Payer: Medicare Other

## 2017-02-07 ENCOUNTER — Ambulatory Visit: Payer: Medicare Other | Admitting: Family Medicine

## 2017-02-13 ENCOUNTER — Encounter (INDEPENDENT_AMBULATORY_CARE_PROVIDER_SITE_OTHER): Payer: Self-pay | Admitting: Orthopaedic Surgery

## 2017-02-13 ENCOUNTER — Telehealth (INDEPENDENT_AMBULATORY_CARE_PROVIDER_SITE_OTHER): Payer: Self-pay | Admitting: Orthopaedic Surgery

## 2017-02-13 ENCOUNTER — Ambulatory Visit (INDEPENDENT_AMBULATORY_CARE_PROVIDER_SITE_OTHER): Payer: Medicare Other | Admitting: Orthopaedic Surgery

## 2017-02-13 ENCOUNTER — Ambulatory Visit (INDEPENDENT_AMBULATORY_CARE_PROVIDER_SITE_OTHER): Payer: Medicare Other

## 2017-02-13 VITALS — BP 135/88 | HR 78 | Ht 61.0 in | Wt 125.0 lb

## 2017-02-13 DIAGNOSIS — G8929 Other chronic pain: Secondary | ICD-10-CM

## 2017-02-13 DIAGNOSIS — M25561 Pain in right knee: Secondary | ICD-10-CM

## 2017-02-13 DIAGNOSIS — M1711 Unilateral primary osteoarthritis, right knee: Secondary | ICD-10-CM | POA: Diagnosis not present

## 2017-02-13 MED ORDER — LIDOCAINE HCL 1 % IJ SOLN
0.5000 mL | INTRAMUSCULAR | Status: AC | PRN
  Administered 2017-02-13: .5 mL

## 2017-02-13 NOTE — Telephone Encounter (Signed)
Patient would like to speak with you personally.

## 2017-02-13 NOTE — Progress Notes (Signed)
Office Visit Note   Patient: Crystal Cooke           Date of Birth: [birth year]           MRN: [id number] Visit Date: 02/13/2017              Requested by: Raylene Everts, MD [phone], [place] [address] PCP: Patient, No Pcp Per   Assessment & Plan: Visit Diagnoses:  1. Chronic pain of right knee     Plan: Knee aspirated clear yellow serous fluid.  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  She states she wants to proceed with total knee arthroplasty and will talk with her daughter currently available to stay with her postoperatively after she is discharged from the hospital.  We discussed total knee arthroplasty looked at model of a knee after arthroplasty.  We discussed postoperative therapy with home physical therapy followed by outpatient therapy.  We had discussed with her in detail that the arthroscopy and partial meniscectomy as well as chondroplasty was hopefully avoid total knee arthroplasty which has not taken care of her symptoms.  She has been on ibuprofen 800 mg p.o. twice daily and at times she is had to use a cane when she ambulates.  She did get some improvement in her symptoms with the knee aspiration she has had in the past.  Follow-Up Instructions: No Follow-up on file.   Orders:  Orders Placed This Encounter  Procedures  . XR Knee Complete 4 Views Right   No orders of the defined types were placed in this encounter.     Procedures: Large Joint Inj: R knee on 02/13/2017 10:10 AM Indications: pain and joint swelling Details: 18 G 1.5 in needle, anterolateral approach  Arthrogram: No  Medications: 0.5 mL lidocaine 1 % Aspirate: 40 mL yellow and clear Outcome: tolerated well, no immediate complications Procedure, treatment alternatives, risks and benefits explained, specific risks discussed. Consent was given by the patient. Immediately prior to procedure a time out was called to  verify the correct patient, procedure, equipment, support staff and site/side marked as required. Patient was prepped and draped in the usual sterile fashion.       Clinical Data: No additional findings.   Subjective: Chief Complaint  Patient presents with  . Right Knee - Routine Post Op    HPI patient returns states her knee is still catching with pain with walking and she has recurrent knee effusion.  Previous bloody fluid was removed.  She has a tense knee pain with motion and can flex to 100 degrees.  MRI preoperatively showed areas of full-thickness cartilage loss primarily lateral compartment and some on the posterior femoral condyle.  She had a complex meniscal tear and had partial meniscectomy but is had persistent pain and swelling after the surgery.  Under plasty was performed and she had areas of grade 3 and grade 4 changes with cartilage flap tear.  She is at aspiration several times since the procedure and continued ongoing knee pain.  Review of Systems view of systems updated and unchanged since her 01/05/2017 knee arthroscopy.   Objective: Vital Signs: BP 135/88   Pulse 78   Ht 5\' 1"  (1.549 m)   Wt 125 lb (56.7 kg)   LMP 02/07/1999 (Approximate)   BMI 23.62 kg/m   Physical Exam  Constitutional: She is oriented to person, place, and time. She appears well-developed.  HENT:  Head: Normocephalic.  Right Ear: External ear normal.  Left Ear: External ear normal.  Eyes: Pupils are equal, round, and reactive to light.  Neck: No tracheal deviation present. No thyromegaly present.  Cardiovascular: Normal rate.  Pulmonary/Chest: Effort normal.  Abdominal: Soft.  Neurological: She is alert and oriented to person, place, and time.  Skin: Skin is warm and dry.  Psychiatric: She has a normal mood and affect. Her behavior is normal.    Ortho Exam 3+ knee effusion right knee.  Left knee has trace effusion.  She has tenderness both medial and lateral joint line.  Pain with  patellofemoral loading.  No increased warmth.  Specialty Comments:  No specialty comments available.  Imaging: No results found.   PMFS History: Patient Active Problem List   Diagnosis Date Noted  . Dislocation of right knee with lateral meniscus tear 01/16/2017  . Chronic pain of right knee 11/07/2016  . Recurrent UTI (urinary tract infection) 03/02/2016  . Torn rotator cuff 03/02/2016  . Essential hypertension 03/02/2016  . HLD (hyperlipidemia) 03/02/2016  . Hematuria 03/02/2016  . 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 Pancreatic cyst 08/28/2014  . Hepatic cyst 08/28/2014   Past Medical History:  Diagnosis Date  . Allergy   . Anxiety   . Atrial fibrillation ([place])    h/o a fib,  NSR with last EKG, has never seen a cardiologist per pt   . DDD (degenerative disc disease), lumbar   . Hypercholesteremia   . Hypertension     Family History  Problem Relation Age of Onset  . Lung cancer Mother           . Hypertension Mother   . Cancer Mother   . Diabetes Father   . Hypertension Father   . Lung cancer Father        deceased age 32  . Cancer Father   . Colon cancer Sister        deceased 2014/05/11, age 80  . Pancreatic cancer Neg Hx     Past Surgical History:  Procedure Laterality Date  . COLONOSCOPY  05/11/14   Dr.Benson- normal colonoscopy, retroflexed views revealed no abnormalities.   . ESOPHAGOGASTRODUODENOSCOPY  11-May-2014   Dr.Benson- normal EGD, retroflexed views revealed no abnormalities   . EUS N/A 09/24/2014   Procedure: UPPER ENDOSCOPIC ULTRASOUND (EUS) RADIAL;  Surgeon: Milus Banister, MD;  Location: [facility];  Service: Endoscopy;  Laterality: N/A;  . KNEE ARTHROSCOPY Right 01/05/2017   Procedure: RIGHT KNEE ARTHROSCOPY, PARTIAL LATERAL MENISCECTOMY,;  Surgeon: Marybelle Killings, MD;  Location: [place] [facility];  Service: Orthopedics;  Laterality: Right;  . none    . PANCREAS SURGERY     partial   Social History   Occupational History  . 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 Smoking status: Never Smoker  . Smokeless tobacco: Never Used  Substance and Sexual Activity  . Alcohol use: No  . Drug use: No  . Sexual activity: Yes    Birth control/protection: None, Post-menopausal

## 2017-02-13 NOTE — Telephone Encounter (Signed)
I called . No answer, left message. Will try again tomorrow

## 2017-02-13 NOTE — Telephone Encounter (Signed)
Patient would like a call back from Dr. Lorin Mercy for him to answer some "personal questions" her CB # [phone]

## 2017-02-14 NOTE — Telephone Encounter (Signed)
done

## 2017-02-15 ENCOUNTER — Ambulatory Visit (INDEPENDENT_AMBULATORY_CARE_PROVIDER_SITE_OTHER): Payer: Medicare Other | Admitting: Orthopaedic Surgery

## 2017-02-22 ENCOUNTER — Ambulatory Visit (INDEPENDENT_AMBULATORY_CARE_PROVIDER_SITE_OTHER): Payer: Medicare Other | Admitting: Orthopaedic Surgery

## 2017-03-01 ENCOUNTER — Ambulatory Visit (INDEPENDENT_AMBULATORY_CARE_PROVIDER_SITE_OTHER): Payer: Medicare Other | Admitting: Orthopaedic Surgery

## 2017-03-06 NOTE — Progress Notes (Signed)
mailed

## 2017-03-12 ENCOUNTER — Encounter: Payer: Self-pay | Admitting: Student in an Organized Health Care Education/Training Program

## 2017-03-12 ENCOUNTER — Other Ambulatory Visit: Payer: Self-pay

## 2017-03-12 ENCOUNTER — Ambulatory Visit (INDEPENDENT_AMBULATORY_CARE_PROVIDER_SITE_OTHER): Payer: Medicare Other | Admitting: Student in an Organized Health Care Education/Training Program

## 2017-03-12 VITALS — BP 118/74 | HR 77 | Temp 98.3°F | Ht 61.0 in | Wt 126.6 lb

## 2017-03-12 DIAGNOSIS — H60501 Unspecified acute noninfective otitis externa, right ear: Secondary | ICD-10-CM | POA: Insufficient documentation

## 2017-03-12 DIAGNOSIS — E876 Hypokalemia: Secondary | ICD-10-CM | POA: Diagnosis not present

## 2017-03-12 DIAGNOSIS — E785 Hyperlipidemia, unspecified: Secondary | ICD-10-CM | POA: Diagnosis not present

## 2017-03-12 MED ORDER — OFLOXACIN 0.3 % OT SOLN: 10.0000 [drp] | Freq: Every day | OTIC | 0 refills | Status: AC

## 2017-03-12 NOTE — Assessment & Plan Note (Signed)
Per patient.  She is on 99 mg tabs of potassium, which is not prescription I am familiar with.  We will go ahead and check BMP at today's visit, and I will prescribe K-Dur if necessary for hypokalemia.

## 2017-03-12 NOTE — Progress Notes (Signed)
CC: Establish care  HPI: Crystal Cooke is a 67 y.o. female who presents to [facility] today to establish care as a new patient.  Ear pain, bilateral - several days duration - has tried cleaning ears due to pain, used bobby pin and q-tip - no fevers, cough, congestion, rhinorrhea - no drainage from the ears  PMH: 1. Anxiety 2. Arthritis 3. Chronic pain 4. HTN 5. HLD  PSH: 1. 2018 shoulder surgery 2. 2019 knee surgery 3. 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 Sister - colon cancer 2. Father - lung cancer, HTN, T2DM 3. Mom - lung cancer, HTN  Social Hx: - denies drug use, alcohol use, tobacco - lives alone, no pets - gets to appointments by medical transportation - designates her daughter to make medical decisions if she were unable to  Allergies: 1. Levaquin - "tendon damage" 2. Statins - "takes the taste out of my mouth"  Meds: 1. Tylenol 500 mg q6H 2. ALprazolam 0.5 mg once daily PRN - roughly once weekly 3. ASA 81 mg qdaily 4. Cartia (Diltiazem) 300 mg qAM 5. Fish oil 1000 mg qdaily 6. Ibuprofen 800 mg qdaily PRN for knee pain 7. Potassium 99 mg tabs by mouth - patient unable to verify mEq 8. Red Yeast rice 600 mg daily   Review of Symptoms:  See HPI for ROS.   CC, SH/smoking status, and VS noted.  Objective: BP 118/74   Pulse 77   Temp 98.3 F (36.8 C) (Oral)   Ht 5\' 1"  (1.549 m)   Wt 126 lb 9.6 oz (57.4 kg)   LMP 02/07/1999 (Approximate)   SpO2 99%   BMI 23.92 kg/m  GEN: NAD, alert, cooperative, and pleasant. EYE: no conjunctival injection, pupils equally round and reactive to light ENMT: normal tympanic light reflex, +erythema noted in right ear canal, no nasal polyps,no rhinorrhea, no pharyngeal erythema or exudates NECK: full ROM, no thyromegaly RESPIRATORY: clear to auscultation bilaterally with no wheezes, rhonchi or rales, good effort CV: RRR, no m/r/g, no peripheral edema GI: soft,  non-tender, non-distended, no hepatosplenomegaly SKIN: warm and dry, no rashes or lesions NEURO: II-XII grossly intact, normal gait, peripheral sensation intact PSYCH: AAOx3, appropriate affect  Assessment and plan:  HLD (hyperlipidemia) Patient previously was tried on a statin but discontinued it because it made her sensation of taste decreased.  She has been trying fish oil, red yeast rice, and lifestyle changes.  She would like to know whether this has made an impact on her cholesterol -Lipid panel today -Asked patient to follow-up to continue discussion about statin medication, therapeutic options for elevated cholesterol  Hypokalemia Per patient.  She is on 99 mg tabs of potassium, which is not prescription I am familiar with.  We will go ahead and check BMP at today's visit, and I will prescribe K-Dur if necessary for hypokalemia.  Acute otitis externa of right ear - prescribed oxfloxacin drops for right ear. Left ear appears normal however she has pain in the canal so it is ok to use drops on that side as well. - follow up as needed if symptoms worsen or fail to improve - encouraged her not to use q-tips and bobby pins for cleaning ears in future - recommended debrox or coming in to office so we can do it   Orders Placed This Encounter  Procedures  . Basic metabolic panel    Order Specific Question:   Has the  patient fasted?    Answer:   No  . Lipid panel    Order Specific Question:   Has the patient fasted?    Answer:   No    Meds ordered this encounter  Medications  . ofloxacin (FLOXIN OTIC) 0.3 % OTIC solution    Sig: Place 10 drops into the right ear daily for 7 days.    Dispense:  3.5 mL    Refill:  0    Everrett Coombe, MD,MS,  PGY2 03/13/2017 11:36 AM

## 2017-03-12 NOTE — Patient Instructions (Signed)
It was a pleasure seeing you today in our clinic. Today we discussed ear pain, cholesterol, potassium. Here is the treatment plan we have discussed and agreed upon together:  We drew blood work at today's visit. I will call or send you a letter with these results. If you do not hear from me within the next week, please give our office a call.  You can hold off taking your potassium medication at home until we get your lab results back.  Please schedule another visit to discuss cholesterol.  Our clinic's number is [phone]. Please call with questions or concerns about what we discussed today.  Be well, Dr. Burr Medico

## 2017-03-12 NOTE — Assessment & Plan Note (Signed)
Patient previously was tried on a statin but discontinued it because it made her sensation of taste decreased.  She has been trying fish oil, red yeast rice, and lifestyle changes.  She would like to know whether this has made an impact on her cholesterol -Lipid panel today -Asked patient to follow-up to continue discussion about statin medication, therapeutic options for elevated cholesterol

## 2017-03-13 ENCOUNTER — Encounter: Payer: Self-pay | Admitting: Student in an Organized Health Care Education/Training Program

## 2017-03-13 LAB — BASIC METABOLIC PANEL
BUN/Creatinine Ratio: 19 (ref 12–28)
BUN: 19 mg/dL (ref 8–27)
CO2: 27 mmol/L (ref 20–29)
CREATININE: 0.98 mg/dL (ref 0.57–1.00)
Calcium: 9.7 mg/dL (ref 8.7–10.3)
Chloride: 103 mmol/L (ref 96–106)
GFR calc non Af Amer: 59 mL/min/{1.73_m2} — ABNORMAL LOW (ref >59)
GFR, EST AFRICAN AMERICAN: 69 mL/min/{1.73_m2} (ref >59)
Glucose: 103 mg/dL — ABNORMAL HIGH (ref 65–99)
Potassium: 3.7 mmol/L (ref 3.5–5.2)
Sodium: 145 mmol/L — ABNORMAL HIGH (ref 134–144)

## 2017-03-13 LAB — LIPID PANEL
Chol/HDL Ratio: 3.7 ratio (ref 0.0–4.4)
Cholesterol, Total: 245 mg/dL — ABNORMAL HIGH (ref 100–199)
HDL: 66 mg/dL (ref >39)
LDL CALC: 153 mg/dL — AB (ref 0–99)
Triglycerides: 129 mg/dL (ref 0–149)
VLDL CHOLESTEROL CAL: 26 mg/dL (ref 5–40)

## 2017-03-13 NOTE — Assessment & Plan Note (Signed)
-   prescribed oxfloxacin drops for right ear. Left ear appears normal however she has pain in the canal so it is ok to use drops on that side as well. - follow up as needed if symptoms worsen or fail to improve - encouraged her not to use q-tips and bobby pins for cleaning ears in future - recommended debrox or coming in to office so we can do it

## 2017-03-30 ENCOUNTER — Ambulatory Visit: Payer: Medicare Other | Admitting: Student in an Organized Health Care Education/Training Program

## 2017-04-03 ENCOUNTER — Other Ambulatory Visit: Payer: Self-pay

## 2017-04-03 ENCOUNTER — Ambulatory Visit (INDEPENDENT_AMBULATORY_CARE_PROVIDER_SITE_OTHER): Payer: Medicare Other | Admitting: Student in an Organized Health Care Education/Training Program

## 2017-04-03 ENCOUNTER — Encounter: Payer: Self-pay | Admitting: Student in an Organized Health Care Education/Training Program

## 2017-04-03 VITALS — BP 138/78 | HR 82 | Temp 98.9°F | Ht 61.0 in | Wt 125.8 lb

## 2017-04-03 DIAGNOSIS — H6690 Otitis media, unspecified, unspecified ear: Secondary | ICD-10-CM | POA: Diagnosis not present

## 2017-04-03 DIAGNOSIS — E785 Hyperlipidemia, unspecified: Secondary | ICD-10-CM | POA: Diagnosis present

## 2017-04-03 DIAGNOSIS — F411 Generalized anxiety disorder: Secondary | ICD-10-CM | POA: Diagnosis not present

## 2017-04-03 DIAGNOSIS — E876 Hypokalemia: Secondary | ICD-10-CM

## 2017-04-03 MED ORDER — AMOXICILLIN 500 MG PO CAPS: 500.0000 mg | ORAL_CAPSULE | Freq: Two times a day (BID) | ORAL | 0 refills | Status: AC

## 2017-04-03 MED ORDER — ALPRAZOLAM 0.5 MG PO TABS: 0.5000 mg | ORAL_TABLET | Freq: Every evening | ORAL | 0 refills | Status: DC | PRN

## 2017-04-03 MED ORDER — ATORVASTATIN CALCIUM 40 MG PO TABS: 40.0000 mg | ORAL_TABLET | Freq: Every day | ORAL | 0 refills | Status: DC

## 2017-04-03 NOTE — Progress Notes (Signed)
CC: follow up  HPI: Crystal Cooke is a 67 y.o. female with PMH significant for HTN, HLD who presents to [facility] today for follow up of chronic issues.    HLD Patient had screening lipid panel completed at previous visit. Cholesterol was elevated and calculated 10 yr ASCVD risk 13.9%, >7.5% so mod-to-high intensity statin is recommended. Patient has been on a statin in the past (she is not sure which one) however she did not like being on this medication because she felt it altered her sense of taste.   Ear Pain Patient endorses left inner ear pain that has not improved with recent course of ear drops for otitis externa. She reports the pain is constant. It has not been associated with congestion, rhinorrhea, fevers, headaches or sinus pain.  History of hypokalemia At patient's last visit to establish care, she was noted to have an unusual potassium supplement on her med list, 99 mg tabs of potassium. K was checked at that visit and found to be normal at 3.7. Patient was asked to hold her potassium supplement for 2 weeks so we can recheck levels today and start [place] if indicated. She denies chest pain or palpitations. No dizziness.  Anxiety Patient feels generally this is controlled with 0.5 mg xanax taken about one time per week. This will have to be assessed further at a future visit. She does ask for Xanax refill.  Review of Symptoms:  See HPI for ROS.   CC, SH/smoking status, and VS noted.  Objective: BP 138/78   Pulse 82   Temp 98.9 F (37.2 C) (Oral)   Ht 5\' 1"  (1.549 m)   Wt 125 lb 12.8 oz (57.1 kg)   LMP 02/07/1999 (Approximate)   SpO2 98%   BMI 23.77 kg/m  GEN: NAD, alert, cooperative, and pleasant. EYE: no conjunctival injection, pupils equally round and reactive to light ENMT: +tympanic effusion with bulging membrane on the left, +tympanic effusion without bulging membrane on the right, +mild erythema in the canal, no nasal polyps,no rhinorrhea, no pharyngeal erythema or  exudates RESPIRATORY: clear to auscultation bilaterally with no wheezes, rhonchi or rales, good effort CV: RRR, no m/r/g, no peripheral edema GI: soft, non-tender, non-distended, no hepatosplenomegaly PSYCH: AAOx3, appropriate affect  Assessment and plan:  HLD (hyperlipidemia) Discussed risks and benefits of starting a statin at today's visit. Patient is agreeable. Atorva 40 mg ordered.  Hypokalemia Noted to have been low in 11/2016 at 3.2. Uncertain etiology. Normal 2 weeks ago at 3.7 and normal at today's visit 3.6 after being off of the potassium. Patient is not on a loop diuretic. She does have a history of a renal cyst which is followed by urology. I do not see a clear reason that her potassium was low. - hold off on supplementation - recheck K at next visit to be sure it remains stable  Acute otitis media Left ear. Previously treated with ear drops for otitis externa but now apparently has otitis media. Will treat with course of amoxicillin. Return precautions provided.  Anxiety - not assessed today other than patient feels it is controlled, episodic. She uses 0.5 mg Xanax ~once weekly for anxiety. Refilled today. Let patient know if she starts requiring this medication more frequently we should reassess whether she needs to be on a daily controller like an SSRI.  Orders Placed This Encounter  Procedures  . Basic metabolic panel    Meds ordered this encounter  Medications  . atorvastatin (LIPITOR) 40 MG tablet  Sig: Take 1 tablet (40 mg total) by mouth daily.    Dispense:  90 tablet    Refill:  0  . amoxicillin (AMOXIL) 500 MG capsule    Sig: Take 1 capsule (500 mg total) by mouth 2 (two) times daily for 7 days.    Dispense:  14 capsule    Refill:  0  . ALPRAZolam (XANAX) 0.5 MG tablet    Sig: Take 1 tablet (0.5 mg total) by mouth at bedtime as needed for anxiety.    Dispense:  30 tablet    Refill:  0    Everrett Coombe, MD,MS,  PGY2 04/04/2017 2:40 PM

## 2017-04-03 NOTE — Patient Instructions (Signed)
It was a pleasure seeing you today in our clinic. Today we discussed your potassium, cholesterol, anxiety and ears. Here is the treatment plan we have discussed and agreed upon together:  Please take the antibiotic prescribed for your ear infection.  I prescribed a statin medication for you to start daily to help with your cholesterol.  We drew blood work at today's visit. I will call or send you a letter with these results. If you do not hear from me within the next week, please give our office a call.  Our clinic's number is [phone]. Please call with questions or concerns about what we discussed today.  Be well, Dr. Burr Medico

## 2017-04-04 ENCOUNTER — Encounter: Payer: Self-pay | Admitting: Student in an Organized Health Care Education/Training Program

## 2017-04-04 DIAGNOSIS — H6690 Otitis media, unspecified, unspecified ear: Secondary | ICD-10-CM | POA: Insufficient documentation

## 2017-04-04 LAB — BASIC METABOLIC PANEL
BUN/Creatinine Ratio: 16 (ref 12–28)
BUN: 15 mg/dL (ref 8–27)
CHLORIDE: 102 mmol/L (ref 96–106)
CO2: 26 mmol/L (ref 20–29)
Calcium: 9.9 mg/dL (ref 8.7–10.3)
Creatinine, Ser: 0.93 mg/dL (ref 0.57–1.00)
GFR calc non Af Amer: 63 mL/min/{1.73_m2} (ref >59)
GFR, EST AFRICAN AMERICAN: 73 mL/min/{1.73_m2} (ref >59)
GLUCOSE: 99 mg/dL (ref 65–99)
POTASSIUM: 3.6 mmol/L (ref 3.5–5.2)
Sodium: 145 mmol/L — ABNORMAL HIGH (ref 134–144)

## 2017-04-04 NOTE — Assessment & Plan Note (Signed)
Discussed risks and benefits of starting a statin at today's visit. Patient is agreeable. Atorva 40 mg ordered.

## 2017-04-04 NOTE — Assessment & Plan Note (Addendum)
Noted to have been low in 11/2016 at 3.2. Uncertain etiology. Normal 2 weeks ago at 3.7 and normal at today's visit 3.6 after being off of the potassium. Patient is not on a loop diuretic. She does have a history of a renal cyst which is followed by urology. I do not see a clear reason that her potassium was low. - hold off on supplementation - recheck K at next visit to be sure it remains stable

## 2017-04-04 NOTE — Assessment & Plan Note (Signed)
Left ear. Previously treated with ear drops for otitis externa but now apparently has otitis media. Will treat with course of amoxicillin. Return precautions provided.

## 2017-05-03 ENCOUNTER — Ambulatory Visit (INDEPENDENT_AMBULATORY_CARE_PROVIDER_SITE_OTHER): Payer: Medicare Other | Admitting: Orthopaedic Surgery

## 2017-05-04 ENCOUNTER — Ambulatory Visit (INDEPENDENT_AMBULATORY_CARE_PROVIDER_SITE_OTHER): Payer: Medicare Other | Admitting: Orthopaedic Surgery

## 2017-05-08 ENCOUNTER — Ambulatory Visit: Payer: Medicare Other | Admitting: Family Medicine

## 2017-05-15 ENCOUNTER — Telehealth (INDEPENDENT_AMBULATORY_CARE_PROVIDER_SITE_OTHER): Payer: Self-pay | Admitting: Orthopaedic Surgery

## 2017-05-15 NOTE — Telephone Encounter (Signed)
Patient has cancelled her RIGHT TOTAL KNEE surgery for 05-30-17 @ 12:30pm. She states she is just "not ready to have it yet".  She says that she has made an appointment to see Dr. Lorin Mercy thisThursday in Between. I encouraged her to leave the appointment until after she has spoken with Dr. Lorin Mercy on Thursday, but she stated that she does not intend to come to the Pre-op appointment with Jeneen Rinks on Thursday, nor the hospital pre-admission appointment.   cb  [phone]

## 2017-05-15 NOTE — Telephone Encounter (Signed)
noted 

## 2017-05-15 NOTE — Telephone Encounter (Signed)
She states knee is not swelling now. She is walking better.  OK to cancel surgery. She will see me Thursday.

## 2017-05-15 NOTE — Telephone Encounter (Signed)
fyi

## 2017-05-16 ENCOUNTER — Ambulatory Visit (INDEPENDENT_AMBULATORY_CARE_PROVIDER_SITE_OTHER): Payer: Medicare Other | Admitting: Surgery

## 2017-05-17 ENCOUNTER — Ambulatory Visit (INDEPENDENT_AMBULATORY_CARE_PROVIDER_SITE_OTHER): Payer: Medicare Other | Admitting: Orthopaedic Surgery

## 2017-05-18 ENCOUNTER — Inpatient Hospital Stay (HOSPITAL_COMMUNITY): Admission: RE | Admit: 2017-05-18 | Payer: Medicare Other | Source: Ambulatory Visit

## 2017-05-21 ENCOUNTER — Ambulatory Visit: Payer: Medicare Other | Admitting: Internal Medicine

## 2017-05-30 ENCOUNTER — Inpatient Hospital Stay: Admit: 2017-05-30 | Payer: Medicare Other | Admitting: Orthopaedic Surgery

## 2017-05-30 SURGERY — ARTHROPLASTY, KNEE, TOTAL
Anesthesia: General | Laterality: Right

## 2017-06-08 ENCOUNTER — Ambulatory Visit (INDEPENDENT_AMBULATORY_CARE_PROVIDER_SITE_OTHER): Payer: Medicare Other | Admitting: Student in an Organized Health Care Education/Training Program

## 2017-06-08 ENCOUNTER — Ambulatory Visit: Payer: Medicare Other | Admitting: Student in an Organized Health Care Education/Training Program

## 2017-06-08 ENCOUNTER — Other Ambulatory Visit: Payer: Self-pay

## 2017-06-08 ENCOUNTER — Encounter: Payer: Self-pay | Admitting: Student in an Organized Health Care Education/Training Program

## 2017-06-08 VITALS — BP 130/78 | HR 72 | Temp 98.2°F | Ht 61.0 in | Wt 126.6 lb

## 2017-06-08 DIAGNOSIS — Z0184 Encounter for antibody response examination: Secondary | ICD-10-CM

## 2017-06-08 DIAGNOSIS — R202 Paresthesia of skin: Secondary | ICD-10-CM

## 2017-06-08 DIAGNOSIS — R600 Localized edema: Secondary | ICD-10-CM | POA: Diagnosis not present

## 2017-06-08 DIAGNOSIS — H60399 Other infective otitis externa, unspecified ear: Secondary | ICD-10-CM | POA: Insufficient documentation

## 2017-06-08 DIAGNOSIS — H60393 Other infective otitis externa, bilateral: Secondary | ICD-10-CM

## 2017-06-08 MED ORDER — CARTIA XT 300 MG PO CP24: 300.0000 mg | ORAL_CAPSULE | Freq: Every morning | ORAL | 3 refills | Status: DC

## 2017-06-08 MED ORDER — OFLOXACIN 0.3 % OT SOLN: 5.0000 [drp] | Freq: Every day | OTIC | 0 refills | Status: AC

## 2017-06-08 NOTE — Assessment & Plan Note (Signed)
R>L ear canal erythema, no effusion or otitis media on exam. - oxfloxacin drops bilateral ears x7d bilaterally - follow up if symptoms worsen or fail to improve

## 2017-06-08 NOTE — Patient Instructions (Signed)
It was a pleasure seeing you today in our clinic. Today we discussed leg swelling and numbness, and ear pain. Here is the treatment plan we have discussed and agreed upon together:  Please use the antibiotic ear drop sent to your pharmacy.  We drew blood work at today's visit. I will call or send you a letter with these results. If you do not hear from me within the next week, please give our office a call.  Our clinic's number is [phone]. Please call with questions or concerns about what we discussed today.  Be well, Dr. Burr Medico

## 2017-06-08 NOTE — Assessment & Plan Note (Addendum)
ANkle edema, very mild. Likely dependent. No red flags for heart failure.  - recommend leg elevation, limit sodium, compression socks PRN LE edema

## 2017-06-08 NOTE — Assessment & Plan Note (Signed)
Patient does have a history of hypokalemia and was previously on [facility] which was stopped in February. - check B12 and BMP - continue multivitamin

## 2017-06-08 NOTE — Progress Notes (Signed)
Subjective:    Crystal Cooke - 67 y.o. female MRN [id number]  Date of birth: Apr 15, 1950  HPI  Crystal Cooke is here for LE edema and paresthesias.  Ankle Edema and LE parethesias - first noticed 3 days. She has been on her feet the last 3 days. No dyspnea, CP, no trouble lying flat.  Additionally, notes that she has been having pins and needle sensation in her toes for 2 weeks. Feels like feet are "asleep." She takes a multivitamin, fish oil daily. Takes garlic and iron. No falls. No sores on her feet. Patient does not have a history of diabetes.  MMR vaccination status unknown Patient reports concern about recent measles outbreaks in the news. She states she is unsure whether her mother every had her vaccinated as a child. No rashes, fevers, or physical symptoms.  Ear pain - states she has R>L ear pain that has bothered her for several weeks. SHe has tried using oil to soften wax and flush out ears. Denies sticking anything in her ears to clean them out. No fevers. No rhinorrhea/congestion. No sore throats. No known sick contacts.  Health Maintenance:  Health Maintenance Due  Topic Date Due  . Hepatitis C Screening  09/03/1950  . DEXA SCAN  03/02/2014    -  reports that she has never smoked. She has never used smokeless tobacco. - Review of Systems: Per HPI. - Past Medical History: Patient Active Problem List   Diagnosis Date Noted  . Otitis, externa, infective 06/08/2017  . Paresthesias 06/08/2017  . Bilateral leg edema 06/08/2017  . Immunity status testing 06/08/2017  . Hypokalemia 03/12/2017  . Dislocation of right knee with lateral meniscus tear 01/16/2017  . Chronic pain of right knee 11/07/2016  . Recurrent UTI (urinary tract infection) 03/02/2016  . Torn rotator cuff 03/02/2016  . Essential hypertension 03/02/2016  . HLD (hyperlipidemia) 03/02/2016  . Hematuria 03/02/2016  . 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 Pancreatic cyst 08/28/2014  . Hepatic cyst 08/28/2014   -  Medications: reviewed and updated Current Outpatient Medications  Medication Sig Dispense Refill  . acetaminophen (TYLENOL) 500 MG tablet Take 1,000 mg by mouth every 6 (six) hours as needed for mild pain.    Marland Kitchen ALPRAZolam (XANAX) 0.5 MG tablet Take 1 tablet (0.5 mg total) by mouth at bedtime as needed for anxiety. 30 tablet 0  . aspirin EC 81 MG tablet Take 81 mg by mouth.    Marland Kitchen atorvastatin (LIPITOR) 40 MG tablet Take 1 tablet (40 mg total) by mouth daily. 90 tablet 0  . CARTIA XT 300 MG 24 hr capsule Take 1 capsule (300 mg total) by mouth every morning. 90 capsule 3  . ibuprofen (ADVIL,MOTRIN) 800 MG tablet     . Multiple Vitamin (MULTIVITAMIN WITH MINERALS) TABS tablet Take 1 tablet by mouth every morning.     Marland Kitchen ofloxacin (FLOXIN) 0.3 % OTIC solution Place 5 drops into both ears daily for 7 days. 5 mL 0  . Omega-3 Fatty Acids (FISH OIL) 1000 MG CAPS Take 1 capsule by mouth every morning.     . promethazine (PHENERGAN) 25 MG tablet Take 1 tablet (25 mg total) by mouth every 12 (twelve) hours. 20 tablet 0  . Red Yeast Rice 600 MG CAPS Take by mouth.     No current facility-administered medications for this visit.     Review of Systems See HPI     Objective:   Physical Exam BP 130/78   Pulse 72  Temp 98.2 F (36.8 C) (Oral)   Ht _0  (1.549 m)   Wt 126 lb 9.6 oz (57.4 kg)   LMP 02/07/1999 (Approximate)   SpO2 99%   BMI 23.92 kg/m  Gen: NAD, alert, cooperative with exam, well-appearing  HEENT: NCAT, PERRL, clear conjunctiva, oropharynx clear, supple neck CV: RRR, good S1/S2, no murmur, no edema, capillary refill brisk  Resp: CTABL, no wheezes, non-labored Abd: SNTND, BS present, no guarding or organomegaly Skin: no rashes, normal turgor  Neuro: no gross deficits.  Psych: good insight, alert and oriented     Assessment & Plan:   Paresthesias Patient does have a history of hypokalemia and was previously on [place] which was stopped in February. - check B12 and BMP -  continue multivitamin   Bilateral leg edema ANkle edema, very mild. Likely dependent. No red flags for heart failure.  - recommend leg elevation, limit sodium, compression socks PRN LE edema  Immunity status testing Will test measles IgG given patients concerns. If she is nonimmune she can be vaccinated with MMR.  Otitis, externa, infective R>L ear canal erythema, no effusion or otitis media on exam. - oxfloxacin drops bilateral ears x7d bilaterally - follow up if symptoms worsen or fail to improve   Orders Placed This Encounter  Procedures  . Basic metabolic panel  . B12  . Measles ([place]) Antibody IgG    Meds ordered this encounter  Medications  . ofloxacin (FLOXIN) 0.3 % OTIC solution    Sig: Place 5 drops into both ears daily for 7 days.    Dispense:  5 mL    Refill:  0  . CARTIA XT 300 MG 24 hr capsule    Sig: Take 1 capsule (300 mg total) by mouth every morning.    Dispense:  90 capsule    Refill:  3   Everrett Coombe, MD,MS,  PGY2 06/08/2017 10:10 AM

## 2017-06-08 NOTE — Assessment & Plan Note (Signed)
Will test measles IgG given patients concerns. If she is nonimmune she can be vaccinated with MMR.

## 2017-06-09 LAB — BASIC METABOLIC PANEL
BUN / CREAT RATIO: 16 (ref 12–28)
BUN: 14 mg/dL (ref 8–27)
CHLORIDE: 102 mmol/L (ref 96–106)
CO2: 26 mmol/L (ref 20–29)
Calcium: 9.6 mg/dL (ref 8.7–10.3)
Creatinine, Ser: 0.86 mg/dL (ref 0.57–1.00)
GFR calc Af Amer: 80 mL/min/{1.73_m2} (ref >59)
GFR calc non Af Amer: 70 mL/min/{1.73_m2} (ref >59)
GLUCOSE: 111 mg/dL — AB (ref 65–99)
Potassium: 4 mmol/L (ref 3.5–5.2)
SODIUM: 143 mmol/L (ref 134–144)

## 2017-06-09 LAB — RUBEOLA ANTIBODY IGG: RUBEOLA AB, IGG: 49.5 AU/mL (ref >29.9)

## 2017-06-09 LAB — VITAMIN B12: Vitamin B-12: 1089 pg/mL (ref 232–1245)

## 2017-06-11 ENCOUNTER — Encounter: Payer: Self-pay | Admitting: Student in an Organized Health Care Education/Training Program

## 2017-06-13 ENCOUNTER — Inpatient Hospital Stay (INDEPENDENT_AMBULATORY_CARE_PROVIDER_SITE_OTHER): Payer: Medicare Other | Admitting: Orthopaedic Surgery

## 2017-06-19 ENCOUNTER — Ambulatory Visit (INDEPENDENT_AMBULATORY_CARE_PROVIDER_SITE_OTHER): Payer: Medicare Other | Admitting: Orthopaedic Surgery

## 2017-07-04 ENCOUNTER — Ambulatory Visit (INDEPENDENT_AMBULATORY_CARE_PROVIDER_SITE_OTHER): Payer: Medicare Other | Admitting: Orthopaedic Surgery

## 2017-07-04 ENCOUNTER — Encounter (INDEPENDENT_AMBULATORY_CARE_PROVIDER_SITE_OTHER): Payer: Self-pay | Admitting: Orthopaedic Surgery

## 2017-07-04 ENCOUNTER — Ambulatory Visit (INDEPENDENT_AMBULATORY_CARE_PROVIDER_SITE_OTHER): Payer: Medicare Other

## 2017-07-04 VITALS — BP 144/88 | HR 74 | Ht 61.0 in | Wt 126.0 lb

## 2017-07-04 DIAGNOSIS — M25562 Pain in left knee: Secondary | ICD-10-CM

## 2017-07-04 DIAGNOSIS — M1711 Unilateral primary osteoarthritis, right knee: Secondary | ICD-10-CM

## 2017-07-04 NOTE — Progress Notes (Signed)
Office Visit Note   Patient: Crystal Cooke           Date of Birth: 04/01/1950           MRN: [id number] Visit Date: 07/04/2017              Requested by: Everrett Coombe, MD [address] [place] [address] PCP: Everrett Coombe, MD   Assessment & Plan: Visit Diagnoses:  1. Acute pain of left knee   2. Unilateral primary osteoarthritis, right knee     Plan: Patient like to proceed with right total knee arthroplasty.  She would need preoperative medical clearance.  Procedure discussed including home physical therapy followed by outpatient therapy.  We discussed spinal anesthesia, importance of compliance with therapy exercises to obtain a satisfactory result.  Risk of revision ,malalignment ,reoperation discussed.  Questions were elicited and answered she understands and requests we proceed.  Decision for surgery was made today.  Follow-Up Instructions: No follow-ups on file.   Orders:  Orders Placed This Encounter  Procedures  . XR Knee 1-2 Views Left   No orders of the defined types were placed in this encounter.     Procedures: No procedures performed   Clinical Data: No additional findings.   Subjective: Chief Complaint  Patient presents with  . Left Knee - Pain  . Right Knee - Pain    HPI 67 year old female returns with ongoing problems with right knee pain swelling giving way and difficulty walking.  She has pain at night.  At times she is used a cane.  She said intra-articular injections had previous arthroscopy last year 2018 with partial meniscectomy and debridement with findings of areas of extensive cartilage loss in the lateral compartment and patellofemoral compartment.  She had gotten some relief from the partial meniscectomy laterally for several months and now pain is increased and she states she is ready to schedule total knee arthroplasty.  Patient is been through therapy exercises anti-inflammatories, intra-articular knee injections, use of  compressive knee sleeves without sustained relief.   Review of Systems past history of pancreatic cyst hepatic cyst, recurrent UTIs hypertension hyperlipidemia.  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  Bilateral knee osteoarthritis worse on the right than left knee.  History of right rotator cuff tear 2018.   Objective: Vital Signs: BP (!) 144/88   Pulse 74   Ht 5\' 1"  (1.549 m)   Wt 126 lb (57.2 kg)   LMP 02/07/1999 (Approximate)   BMI 23.81 kg/m   Physical Exam  Constitutional: She is oriented to person, place, and time. She appears well-developed.  HENT:  Head: Normocephalic.  Right Ear: External ear normal.  Left Ear: External ear normal.  Eyes: Pupils are equal, round, and reactive to light.  Neck: No tracheal deviation present. No thyromegaly present.  Cardiovascular: Normal rate.  Pulmonary/Chest: Effort normal.  Abdominal: Soft.  Neurological: She is alert and oriented to person, place, and time.  Skin: Skin is warm and dry.  Psychiatric: She has a normal mood and affect. Her behavior is normal.    Ortho Exam patient ambulates with a right knee limp.  There is 2+ knee effusion.  Lateral joint line tenderness and crepitus with knee extension and flexion.  Normal hip range of motion without groin pain.  Negative straight leg raising 90 degrees no sciatic notch tenderness.  Distal pulses are 2+ and palpable.  Anterior tib EHL gastrocsoleus is strong.  Specialty Comments:  No  specialty comments available.  Imaging: CLINICAL DATA:  Right knee pain for 4 months.  EXAM: MRI OF THE RIGHT KNEE WITHOUT CONTRAST  TECHNIQUE: Multiplanar, multisequence MR imaging of the knee was performed. No intravenous contrast was administered.  COMPARISON:  Radiographs dated 08/02/2016  FINDINGS: MENISCI  Medial meniscus:  Normal.  Lateral meniscus: Inhomogeneous diffuse irregular signal from the anterior horn consistent with a complex tear.  Posterior horn is normal.  LIGAMENTS  Cruciates:  Intact ACL and PCL.  Collaterals: Medial collateral ligament is intact. Lateral collateral ligament complex is intact.  CARTILAGE  Patellofemoral: Focal full-thickness cartilage loss of the apex and medial facet of the patella.  Medial:  Diffuse partial thickness cartilage loss.  Lateral: Full-thickness cartilage loss on the tibial plateau. There is a partial and full-thickness cartilage loss on the femoral condyle.  Joint: Large joint effusion. Calcified 6 mm loose body posterior to the root of the posterior horn of the medial meniscus.  Popliteal Fossa: 6 x 3 x 1.2 cm Baker's cyst. Popliteus tendon is intact.  Extensor Mechanism:  Intact quadriceps tendon and patellar tendon.  Bones:  Minimal tricompartmental marginal osteophytes.  Other: None  IMPRESSION: 1. Complex tear of the anterior horn of the lateral meniscus. 2. Extensive cartilage loss in the lateral compartment. Mild cartilage loss of the patellofemoral compartment. 3. Large joint effusion.  Loose body in the joint.  Baker's cyst.   Electronically Signed   By: Lorriane Shire M.D.   On: 11/13/2016 09:22    PMFS History: Patient Active Problem List   Diagnosis Date Noted  . Otitis, externa, infective 06/08/2017  . Paresthesias 06/08/2017  . Bilateral leg edema 06/08/2017  . Immunity status testing 06/08/2017  . Hypokalemia 03/12/2017  . Dislocation of right knee with lateral meniscus tear 01/16/2017  . Chronic pain of right knee 11/07/2016  . Recurrent UTI (urinary tract infection) 03/02/2016  . Torn rotator cuff 03/02/2016  . Essential hypertension 03/02/2016  . HLD (hyperlipidemia) 03/02/2016  . Hematuria 03/02/2016  . 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 Pancreatic cyst 08/28/2014  . Hepatic cyst 08/28/2014   Past Medical History:  Diagnosis Date  . Allergy   . Anxiety   . Atrial fibrillation ([place])    h/o a fib,  NSR with last  EKG, has never seen a cardiologist per pt   . DDD (degenerative disc disease), lumbar   . Hypercholesteremia   . Hypertension     Family History  Problem Relation Age of Onset  . Lung cancer Mother           . Hypertension Mother   . Cancer Mother   . Diabetes Father   . Hypertension Father   . Lung cancer Father        deceased age 14  . Cancer Father   . Colon cancer Sister        deceased 2014/05/10, age 44  . Pancreatic cancer Neg Hx     Past Surgical History:  Procedure Laterality Date  . COLONOSCOPY  10-May-2014   Dr.Benson- normal colonoscopy, retroflexed views revealed no abnormalities.   . ESOPHAGOGASTRODUODENOSCOPY  05-10-2014   Dr.Benson- normal EGD, retroflexed views revealed no abnormalities   . EUS N/A 09/24/2014   Procedure: UPPER ENDOSCOPIC ULTRASOUND (EUS) RADIAL;  Surgeon: Milus Banister, MD;  Location: [facility];  Service: Endoscopy;  Laterality: N/A;  . KNEE ARTHROSCOPY Right 01/05/2017   Procedure: RIGHT KNEE ARTHROSCOPY, PARTIAL LATERAL MENISCECTOMY,;  Surgeon: Marybelle Killings, MD;  Location: [facility];  Service: Orthopedics;  Laterality: Right;  . none    . PANCREAS SURGERY     partial   Social History   Occupational History  . 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 Smoking status: Never Smoker  . Smokeless tobacco: Never Used  Substance and Sexual Activity  . Alcohol use: No  . Drug use: No  . Sexual activity: Yes    Birth control/protection: None, Post-menopausal

## 2017-07-06 ENCOUNTER — Encounter (INDEPENDENT_AMBULATORY_CARE_PROVIDER_SITE_OTHER): Payer: Self-pay | Admitting: Orthopaedic Surgery

## 2017-07-06 DIAGNOSIS — M1711 Unilateral primary osteoarthritis, right knee: Secondary | ICD-10-CM | POA: Insufficient documentation

## 2017-07-10 ENCOUNTER — Other Ambulatory Visit (INDEPENDENT_AMBULATORY_CARE_PROVIDER_SITE_OTHER): Payer: Self-pay

## 2017-07-10 ENCOUNTER — Ambulatory Visit: Payer: Medicare Other | Admitting: Internal Medicine

## 2017-07-17 ENCOUNTER — Encounter: Payer: Self-pay | Admitting: Internal Medicine

## 2017-07-17 ENCOUNTER — Other Ambulatory Visit: Payer: Self-pay

## 2017-07-17 ENCOUNTER — Other Ambulatory Visit (HOSPITAL_COMMUNITY)
Admission: RE | Admit: 2017-07-17 | Discharge: 2017-07-17 | Disposition: A | Discharge status: Home / Self Care | Payer: Medicare Other | Source: Ambulatory Visit | Attending: Family Medicine | Admitting: Family Medicine

## 2017-07-17 ENCOUNTER — Ambulatory Visit (INDEPENDENT_AMBULATORY_CARE_PROVIDER_SITE_OTHER): Payer: Medicare Other | Admitting: Internal Medicine

## 2017-07-17 VITALS — BP 112/68 | HR 71 | Temp 98.5°F | Ht 61.0 in | Wt 128.4 lb

## 2017-07-17 DIAGNOSIS — M25561 Pain in right knee: Secondary | ICD-10-CM | POA: Diagnosis not present

## 2017-07-17 DIAGNOSIS — G8929 Other chronic pain: Secondary | ICD-10-CM | POA: Diagnosis not present

## 2017-07-17 DIAGNOSIS — Z113 Encounter for screening for infections with a predominantly sexual mode of transmission: Secondary | ICD-10-CM | POA: Insufficient documentation

## 2017-07-17 DIAGNOSIS — Z114 Encounter for screening for human immunodeficiency virus [HIV]: Secondary | ICD-10-CM | POA: Diagnosis not present

## 2017-07-17 DIAGNOSIS — H9203 Otalgia, bilateral: Secondary | ICD-10-CM | POA: Diagnosis not present

## 2017-07-17 DIAGNOSIS — M25562 Pain in left knee: Secondary | ICD-10-CM

## 2017-07-17 LAB — POCT WET PREP (WET MOUNT)
Clue Cells Wet Prep Whiff POC: NEGATIVE
Trichomonas Wet Prep HPF POC: ABSENT

## 2017-07-17 MED ORDER — CIPROFLOXACIN-DEXAMETHASONE 0.3-0.1 % OT SUSP: 4.0000 [drp] | Freq: Two times a day (BID) | OTIC | 0 refills | Status: DC

## 2017-07-17 NOTE — Patient Instructions (Signed)
I have prescribed Ciprodex for your ears which has a steroid in it and will hopefully help with your pain. I have also placed a referral to ENT that you can go to if you are still having pain.   I have also placed the referral to orthopedics for a second opinion for your knee pain/knee replacement surgery.   We will test you for sexually transmitted infections today and call you with the results.

## 2017-07-17 NOTE — Progress Notes (Signed)
   Subjective:    Crystal Cooke - 67 y.o. female MRN [id number]  Date of birth: 11-12-1950  HPI  Crystal Cooke is here for multiple concerns.  Ear Pain: Bilaterally. Has been present >3 months. Seen by PCP Dr. Burr Medico on 5/3. Treated with Ofloxacin without any change in her symptoms. She reports persistent pain. Denies drainage, fevers, congestion, cough. Does endorse some chronic rhinorrhea. Does not take allergy medications.    Knee Pain: Has been seen by orthopedics, Dr. Lorin Mercy, and was planning to have knee replacement surgery at the end of this month. However, she requests second opinion.   Screening for STDs: Requests STD testing due to having unprotected sexual intercourse one week prior. Endorses some minimal increase in vaginal discharge. Denies pelvic pain, vomiting, fevers, dysuria, abnormal uterine bleeding, and abdominal pain.    -  reports that she has never smoked. She has never used smokeless tobacco. - Review of Systems: Per HPI. - Past Medical History: Patient Active Problem List   Diagnosis Date Noted  . Unilateral primary osteoarthritis, right knee 07/06/2017  . Otitis, externa, infective 06/08/2017  . Paresthesias 06/08/2017  . Bilateral leg edema 06/08/2017  . Immunity status testing 06/08/2017  . Hypokalemia 03/12/2017  . Dislocation of right knee with lateral meniscus tear 01/16/2017  . Chronic pain of right knee 11/07/2016  . Recurrent UTI (urinary tract infection) 03/02/2016  . Torn rotator cuff 03/02/2016  . Essential hypertension 03/02/2016  . HLD (hyperlipidemia) 03/02/2016  . Hematuria 03/02/2016  . 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 Pancreatic cyst 08/28/2014  . Hepatic cyst 08/28/2014   - Medications: reviewed and updated   Objective:   Physical Exam BP 112/68   Pulse 71   Temp 98.5 F (36.9 C) (Oral)   Ht 5\' 1"  (1.549 m)   Wt 128 lb 6.4 oz (58.2 kg)   LMP 02/07/1999 (Approximate)   SpO2 98%   BMI 24.26 kg/m  Gen: NAD, alert, cooperative  with exam, well-appearing HEENT: NCAT, PERRL, clear conjunctiva, oropharynx clear, turbinates edematous and erythematous, ear canals erythematous without lesions or drainage, TMs normal bilaterally  GYN: Declined exam.      Assessment & Plan:   1. Screening for STD (sexually transmitted disease) Wet prep unremarkable aside from moderate bacteria and >20 WBCs.  - RPR - POCT Wet Prep [facility]) - Cervicovaginal ancillary only  2. Otalgia of both ears Patient still with diffuse erythema of ear canals. Will treat with antibiotic-steroid combination drop in the hopes that steroid component will improve pain. Given reported chronic rhinorrhea there may be a component of allergic rhinitis and eustachian tube dysfunction. Could consider nasal steroid if symptoms do not improve. Patient requests ENT referral.  - Ambulatory referral to ENT - ciprofloxacin-dexamethasone (CIPRODEX) OTIC suspension; Place 4 drops into both ears 2 (two) times daily.  Dispense: 7.5 mL; Refill: 0  3. Chronic pain of both knees - Ambulatory referral to Orthopedics  4. Screening for human immunodeficiency virus - HIV antibody (with reflex)   Phill Myron, D.O. 07/17/2017, 3:17 PM PGY-3, [place]

## 2017-07-18 LAB — SYPHILIS: RPR W/REFLEX TO RPR TITER AND TREPONEMAL ANTIBODIES, TRADITIONAL SCREENING AND DIAGNOSIS ALGORITHM: RPR Ser Ql: NONREACTIVE

## 2017-07-18 LAB — HIV ANTIBODY (ROUTINE TESTING W REFLEX): HIV SCREEN 4TH GENERATION: NONREACTIVE

## 2017-07-19 ENCOUNTER — Telehealth: Payer: Self-pay | Admitting: *Deleted

## 2017-07-19 ENCOUNTER — Encounter: Payer: Self-pay | Admitting: *Deleted

## 2017-07-19 ENCOUNTER — Ambulatory Visit (INDEPENDENT_AMBULATORY_CARE_PROVIDER_SITE_OTHER): Payer: Medicare Other | Admitting: Orthopaedic Surgery

## 2017-07-19 LAB — CERVICOVAGINAL ANCILLARY ONLY
Chlamydia: NEGATIVE
Neisseria Gonorrhea: NEGATIVE

## 2017-07-19 NOTE — Telephone Encounter (Signed)
LVM to call office back to inform her of below.Zimmerman Rumple, Bryttani Blew D, CMA  

## 2017-07-19 NOTE — Telephone Encounter (Signed)
Pt called back and I gave her the below information and she would like to have her results mailed to her.  Routing to Dr. Juleen China to send this out to her. Crystal Cooke, April D, [place]

## 2017-07-19 NOTE — Telephone Encounter (Signed)
-----   Message from Nicolette Bang, DO sent at 07/19/2017 12:29 PM EDT ----- Please call patient to let her know HIV, Syphilis, Gonorrhea and Chlamydia were negative.   Phill Myron, D.O. 07/19/2017, 12:29 PM PGY-3, [place]

## 2017-07-23 ENCOUNTER — Telehealth: Payer: Self-pay | Admitting: Student in an Organized Health Care Education/Training Program

## 2017-07-23 NOTE — Telephone Encounter (Signed)
Patient called Dr. Clyde Canterbury office (ear doctor) [facility]) and they do not have the referral on their end .  Can you see what is going on with this and call her back [phone]

## 2017-07-24 ENCOUNTER — Other Ambulatory Visit (HOSPITAL_COMMUNITY): Payer: Medicare Other

## 2017-07-24 ENCOUNTER — Telehealth (INDEPENDENT_AMBULATORY_CARE_PROVIDER_SITE_OTHER): Payer: Self-pay | Admitting: Orthopaedic Surgery

## 2017-07-24 NOTE — Telephone Encounter (Signed)
fyi

## 2017-07-24 NOTE — Telephone Encounter (Signed)
°  Dr. Lorin Mercy' patient Crystal Cooke (R-TKA) for June 28th @1 :30 has cancelled her surgery. She was reluctant to say which orthopedic group she is being followed by, but states that her daughters had arranged to have her seen by someone else.

## 2017-08-03 ENCOUNTER — Ambulatory Visit: Admit: 2017-08-03 | Payer: Medicare Other | Admitting: Orthopaedic Surgery

## 2017-08-03 SURGERY — ARTHROPLASTY, KNEE, TOTAL
Anesthesia: General | Site: Knee | Laterality: Right

## 2017-08-13 DIAGNOSIS — H9203 Otalgia, bilateral: Secondary | ICD-10-CM | POA: Diagnosis not present

## 2017-08-16 ENCOUNTER — Inpatient Hospital Stay (INDEPENDENT_AMBULATORY_CARE_PROVIDER_SITE_OTHER): Payer: Medicare Other | Admitting: Orthopaedic Surgery

## 2017-08-16 DIAGNOSIS — M1711 Unilateral primary osteoarthritis, right knee: Secondary | ICD-10-CM | POA: Diagnosis not present

## 2017-08-16 DIAGNOSIS — M1712 Unilateral primary osteoarthritis, left knee: Secondary | ICD-10-CM | POA: Diagnosis not present

## 2017-08-31 DIAGNOSIS — M1712 Unilateral primary osteoarthritis, left knee: Secondary | ICD-10-CM | POA: Diagnosis not present

## 2017-08-31 DIAGNOSIS — M25561 Pain in right knee: Secondary | ICD-10-CM | POA: Diagnosis not present

## 2017-08-31 DIAGNOSIS — M1711 Unilateral primary osteoarthritis, right knee: Secondary | ICD-10-CM | POA: Diagnosis not present

## 2017-08-31 DIAGNOSIS — R600 Localized edema: Secondary | ICD-10-CM | POA: Diagnosis not present

## 2017-08-31 DIAGNOSIS — M25562 Pain in left knee: Secondary | ICD-10-CM | POA: Diagnosis not present

## 2017-09-03 ENCOUNTER — Ambulatory Visit: Payer: Medicare Other | Admitting: Student in an Organized Health Care Education/Training Program

## 2017-09-11 DIAGNOSIS — M25562 Pain in left knee: Secondary | ICD-10-CM | POA: Diagnosis not present

## 2017-09-11 DIAGNOSIS — M25561 Pain in right knee: Secondary | ICD-10-CM | POA: Diagnosis not present

## 2017-09-12 ENCOUNTER — Ambulatory Visit: Payer: Medicare Other | Admitting: Student in an Organized Health Care Education/Training Program

## 2017-09-17 ENCOUNTER — Telehealth: Payer: Self-pay | Admitting: Student in an Organized Health Care Education/Training Program

## 2017-09-17 NOTE — Telephone Encounter (Signed)
Pt called and believes she may have pink eye. Due to transportation and other appointments she would not be able to come into the office until later next week. She was wondering if there is any way for Dr. Burr Medico to send anything to the pharmacy for her eye or give her a call with any suggestions.

## 2017-09-18 NOTE — Telephone Encounter (Signed)
Patient needs to be evaluated in person for this problem. If she develops decreased vision or eye pain she needs to seek medical attention immediately.

## 2017-09-20 DIAGNOSIS — B309 Viral conjunctivitis, unspecified: Secondary | ICD-10-CM | POA: Diagnosis not present

## 2017-09-20 DIAGNOSIS — Z79899 Other long term (current) drug therapy: Secondary | ICD-10-CM | POA: Diagnosis not present

## 2017-09-20 DIAGNOSIS — I10 Essential (primary) hypertension: Secondary | ICD-10-CM | POA: Diagnosis not present

## 2017-09-20 DIAGNOSIS — E7800 Pure hypercholesterolemia, unspecified: Secondary | ICD-10-CM | POA: Diagnosis not present

## 2017-09-20 NOTE — Telephone Encounter (Signed)
Contacted pt and she has an appointment with an eye doctor tomorrow to have this checked out.Katharina Caper, April D, [place]

## 2017-09-21 DIAGNOSIS — B309 Viral conjunctivitis, unspecified: Secondary | ICD-10-CM | POA: Diagnosis not present

## 2017-09-21 DIAGNOSIS — H1033 Unspecified acute conjunctivitis, bilateral: Secondary | ICD-10-CM | POA: Diagnosis not present

## 2017-09-24 DIAGNOSIS — M25562 Pain in left knee: Secondary | ICD-10-CM | POA: Diagnosis not present

## 2017-10-09 ENCOUNTER — Other Ambulatory Visit
Admission: RE | Admit: 2017-10-09 | Discharge: 2017-10-09 | Disposition: A | Discharge status: Home / Self Care | Payer: Medicare Other | Source: Ambulatory Visit | Attending: Orthopedic Surgery | Admitting: Orthopedic Surgery

## 2017-10-09 DIAGNOSIS — S83282A Other tear of lateral meniscus, current injury, left knee, initial encounter: Secondary | ICD-10-CM | POA: Insufficient documentation

## 2017-10-09 LAB — SYNOVIAL CELL COUNT + DIFF, W/ CRYSTALS
CRYSTALS FLUID: NONE SEEN
Eosinophils-Synovial: 0 %
Lymphocytes-Synovial Fld: 47 %
MONOCYTE-MACROPHAGE-SYNOVIAL FLUID: 31 %
Neutrophil, Synovial: 22 %
WBC, SYNOVIAL: 57 /mm3 (ref 0–200)

## 2017-10-10 ENCOUNTER — Telehealth: Payer: Self-pay | Admitting: Student in an Organized Health Care Education/Training Program

## 2017-10-10 NOTE — Telephone Encounter (Signed)
Pt has an appt on 10/18/2017 with Dr. Burr Medico. Ottis Stain, CMA

## 2017-10-10 NOTE — Telephone Encounter (Signed)
Received surgical clearance form for patient's knee replacement. Please have her come in for a visit to review her past medical history and perform any additional testing required for surgical risk assessment.  Everrett Coombe, MD PGY-3 [facility]

## 2017-10-16 DIAGNOSIS — B309 Viral conjunctivitis, unspecified: Secondary | ICD-10-CM | POA: Diagnosis not present

## 2017-10-18 ENCOUNTER — Other Ambulatory Visit: Payer: Self-pay

## 2017-10-18 ENCOUNTER — Ambulatory Visit (INDEPENDENT_AMBULATORY_CARE_PROVIDER_SITE_OTHER): Payer: Medicare Other | Admitting: Student in an Organized Health Care Education/Training Program

## 2017-10-18 ENCOUNTER — Encounter: Payer: Self-pay | Admitting: Student in an Organized Health Care Education/Training Program

## 2017-10-18 ENCOUNTER — Other Ambulatory Visit (HOSPITAL_COMMUNITY)
Admission: RE | Admit: 2017-10-18 | Discharge: 2017-10-18 | Disposition: A | Discharge status: Home / Self Care | Payer: Medicare Other | Source: Ambulatory Visit | Attending: Family Medicine | Admitting: Family Medicine

## 2017-10-18 ENCOUNTER — Ambulatory Visit (HOSPITAL_COMMUNITY)
Admission: RE | Admit: 2017-10-18 | Discharge: 2017-10-18 | Disposition: A | Discharge status: Home / Self Care | Payer: Medicare Other | Source: Ambulatory Visit | Attending: Family Medicine | Admitting: Family Medicine

## 2017-10-18 VITALS — BP 120/72 | HR 93 | Temp 98.4°F | Wt 127.0 lb

## 2017-10-18 DIAGNOSIS — N898 Other specified noninflammatory disorders of vagina: Secondary | ICD-10-CM

## 2017-10-18 DIAGNOSIS — Z01818 Encounter for other preprocedural examination: Secondary | ICD-10-CM

## 2017-10-18 DIAGNOSIS — Z139 Encounter for screening, unspecified: Secondary | ICD-10-CM | POA: Diagnosis not present

## 2017-10-18 DIAGNOSIS — Z7189 Other specified counseling: Secondary | ICD-10-CM

## 2017-10-18 DIAGNOSIS — Z23 Encounter for immunization: Secondary | ICD-10-CM

## 2017-10-18 DIAGNOSIS — I517 Cardiomegaly: Secondary | ICD-10-CM | POA: Diagnosis not present

## 2017-10-18 DIAGNOSIS — M1712 Unilateral primary osteoarthritis, left knee: Secondary | ICD-10-CM | POA: Diagnosis not present

## 2017-10-18 DIAGNOSIS — I10 Essential (primary) hypertension: Secondary | ICD-10-CM | POA: Diagnosis not present

## 2017-10-18 LAB — POCT WET PREP (WET MOUNT)
Clue Cells Wet Prep Whiff POC: NEGATIVE
Trichomonas Wet Prep HPF POC: ABSENT

## 2017-10-18 MED ORDER — DICLOFENAC SODIUM 1 % TD GEL: 2.0000 g | Freq: Four times a day (QID) | TRANSDERMAL | 3 refills | Status: DC

## 2017-10-18 NOTE — Patient Instructions (Signed)
It was a pleasure seeing you today in our clinic.  We drew blood work at today's visit. I will call or send you a letter with these results. If you do not hear from me within the next week, please give our office a call.  Our clinic's number is [phone]. Please call with questions or concerns about what we discussed today.  Be well, Dr. Montario Zilka   

## 2017-10-18 NOTE — Progress Notes (Addendum)
Subjective:  Crystal Cooke is a 67 y.o. female who presents to the [facility] today for surgical clearance. She is scheduled for a left knee replacement in October.    HPI:  Encounter for surgical risk stratification  - Left total knee replacement. Patient reports she is doing well today but is anxious about the surgery. She has questions about Diclofenac cream for her knees which her brother recommended. She says that if she can get the cream and it works she is going to cancel her surgery. Patient reports her knees are still bothering her and that the left one is bothering her more than the right. The left knee had fluid drained from it 10/09/17 and patient said that helped with the swelling initially but that it has since returned. She also complains of occasional locking of her left knee. She has stiffness and pain that is worst when she first gets up from sitting. She takes occasional tylenol.   Vaginal itching - patient disclosed to [place] who had her do a self-swab. Briefly, she denies hematuria, dysuria, vaginal discharge or dyspareunia. Vaginal complaint to be discussed further at a future visit if it persists.   Reports no surgical complications in the past. Denies any recent illness, HA, blurry vision, NVD, fever, chills, hematuria, dysuria. Acknowledges vaginal itching and discharge.   Objective:  Physical Exam: BP 120/72   Pulse 93   Temp 98.4 F (36.9 C) (Oral)   Wt 127 lb (57.6 kg)   LMP 02/07/1999 (Approximate)   SpO2 99%   BMI 24.00 kg/m   Gen: NAD, resting comfortably CV: RRR with no murmurs appreciated Pulm: NWOB, CTAB with no crackles, wheezes, or rhonchi GI: Normal bowel sounds present. Soft, Nontender, Nondistended. MSK: no edema, cyanosis, or clubbing noted Skin: warm, dry Neuro: grossly normal, moves all extremities Psych: Normal affect and thought content Knee: Normal to inspection with no erythema or effusion or obvious bony abnormalities. Palpation normal  with no warmth, joint line tenderness, patellar tenderness, or condyle tenderness. ROM full in flexion and extension and lower leg rotation. Ligaments with solid consistent endpoints including ACL, PCL, LCL, MCL. Negative [place] test. Non painful patellar compression. Patellar glide with notable crepitus. Patellar and quadriceps tendons unremarkable. Hamstring and quadriceps strength is normal.     Results for orders placed or performed in visit on 10/18/17 (from the past 24 hour(s))  POCT Wet Prep [facility])     Status: Abnormal   Collection Time: 10/18/17 10:37 AM  Result Value Ref Range   Source Wet Prep POC VAG    WBC, Wet Prep HPF POC 5-10    Bacteria Wet Prep HPF POC Many (A) Few   Clue Cells Wet Prep HPF POC None None   Clue Cells Wet Prep Whiff POC Negative Whiff    Yeast Wet Prep HPF POC None None   KOH Wet Prep POC None None   Trichomonas Wet Prep HPF POC Absent Absent    Assessment/Plan:  Crystal Cooke is a 67 y.o. female who presents to the [facility] today for surgical clearance. She is scheduled for a left knee replacement in October.    Surgical Risk stratification - plan to check labs for moderate risk surgery and risk stratify based on RCRI risk calculator. Labs drawn today include CBC to check for anemia, CMP for albumin and creatinine. If abnormal LFTs on CMP would consider PT/INR prior to surgery. -- CBC, CMP -- EKG unchanged from previous   Left knee osteoarthritis -  patient is unsure whether she will proceed with elective surgery for knee replacement. She would like to attempt more conservative management with medicines first, which seems appropriate. -- Diclofenac gel prescription sent to pharmacy  - recommended tylenol  Vaginal itching - self-swab wet prep completed prior to my entering the room. Wet prep was negative. Patient to follow up if symptoms persist for dedicated visit to focus on vaginal complaint.  [name] [name], [place] [facility]  10/18/2017 10:43 AM   I independently examined and interviewed the patient and edited the above note to reflect my findings and plan.  Everrett Coombe, MD PGY-3 [facility]

## 2017-10-19 LAB — COMPREHENSIVE METABOLIC PANEL
A/G RATIO: 1.6 (ref 1.2–2.2)
ALT: 16 IU/L (ref 0–32)
AST: 17 IU/L (ref 0–40)
Albumin: 4.4 g/dL (ref 3.6–4.8)
Alkaline Phosphatase: 110 IU/L (ref 39–117)
BUN / CREAT RATIO: 15 (ref 12–28)
BUN: 14 mg/dL (ref 8–27)
Bilirubin Total: 0.3 mg/dL (ref 0.0–1.2)
CALCIUM: 9.7 mg/dL (ref 8.7–10.3)
CO2: 28 mmol/L (ref 20–29)
Chloride: 101 mmol/L (ref 96–106)
Creatinine, Ser: 0.94 mg/dL (ref 0.57–1.00)
GFR, EST AFRICAN AMERICAN: 72 mL/min/{1.73_m2} (ref >59)
GFR, EST NON AFRICAN AMERICAN: 63 mL/min/{1.73_m2} (ref >59)
GLOBULIN, TOTAL: 2.7 g/dL (ref 1.5–4.5)
Glucose: 92 mg/dL (ref 65–99)
POTASSIUM: 4 mmol/L (ref 3.5–5.2)
SODIUM: 144 mmol/L (ref 134–144)
Total Protein: 7.1 g/dL (ref 6.0–8.5)

## 2017-10-19 LAB — CERVICOVAGINAL ANCILLARY ONLY
Chlamydia: NEGATIVE
Neisseria Gonorrhea: NEGATIVE

## 2017-10-19 LAB — CBC
HEMATOCRIT: 34.6 % (ref 34.0–46.6)
Hemoglobin: 11.4 g/dL (ref 11.1–15.9)
MCH: 31.7 pg (ref 26.6–33.0)
MCHC: 32.9 g/dL (ref 31.5–35.7)
MCV: 96 fL (ref 79–97)
Platelets: 301 10*3/uL (ref 150–450)
RBC: 3.6 x10E6/uL — ABNORMAL LOW (ref 3.77–5.28)
RDW: 12 % — AB (ref 12.3–15.4)
WBC: 7.8 10*3/uL (ref 3.4–10.8)

## 2017-11-01 ENCOUNTER — Ambulatory Visit: Payer: Medicare Other | Admitting: Family Medicine

## 2017-11-05 ENCOUNTER — Ambulatory Visit: Payer: Medicare Other | Admitting: Family Medicine

## 2017-11-07 DIAGNOSIS — M25562 Pain in left knee: Secondary | ICD-10-CM | POA: Diagnosis not present

## 2017-11-07 DIAGNOSIS — M25662 Stiffness of left knee, not elsewhere classified: Secondary | ICD-10-CM | POA: Diagnosis not present

## 2017-11-14 DIAGNOSIS — Z01818 Encounter for other preprocedural examination: Secondary | ICD-10-CM | POA: Diagnosis not present

## 2017-11-14 DIAGNOSIS — Z01812 Encounter for preprocedural laboratory examination: Secondary | ICD-10-CM | POA: Diagnosis not present

## 2017-11-14 DIAGNOSIS — S83282A Other tear of lateral meniscus, current injury, left knee, initial encounter: Secondary | ICD-10-CM | POA: Diagnosis not present

## 2017-11-19 ENCOUNTER — Telehealth: Payer: Self-pay | Admitting: Student in an Organized Health Care Education/Training Program

## 2017-11-19 NOTE — Telephone Encounter (Signed)
[facility] called regarding a surgical clearance for this patient. Pt has surgery scheduled for this Wednesday, Oct 16th and still has not gotten surgical clearance. Looks like labs were done at her last appt for the surgical clearance. They are faxing over a surgical clearance in hopes that the pt can be cleared before Wednesday because the pt will be very mad if she has to cancel her surgery. Please advise

## 2017-11-20 ENCOUNTER — Encounter: Payer: Self-pay | Admitting: Family Medicine

## 2017-11-20 NOTE — Telephone Encounter (Signed)
Will forward to Dr. Burr Medico.  Jazmin Hartsell,CMA

## 2017-11-20 NOTE — Telephone Encounter (Signed)
[facility] is calling back and really needs the surgical clearance and also the EKG's and labs faxed to them at [phone]. jw

## 2017-11-20 NOTE — Telephone Encounter (Signed)
Pt called nurse this morning checking on status of this. The surgical clearance form is in your box, her surgery is scheduled for tomorrow. Unsure if you will be able to have this completed today, if not I can get a preceptor to do this. Just let me know.

## 2017-11-20 NOTE — Progress Notes (Signed)
Filled out form for surgical clearance RCRiI23.9% Surgery for knee surgery Labs reviewed  EKG reviewed

## 2017-11-21 DIAGNOSIS — I10 Essential (primary) hypertension: Secondary | ICD-10-CM | POA: Diagnosis present

## 2017-11-21 DIAGNOSIS — Z9081 Acquired absence of spleen: Secondary | ICD-10-CM | POA: Diagnosis not present

## 2017-11-21 DIAGNOSIS — G8918 Other acute postprocedural pain: Secondary | ICD-10-CM | POA: Diagnosis not present

## 2017-11-21 DIAGNOSIS — K7689 Other specified diseases of liver: Secondary | ICD-10-CM | POA: Diagnosis present

## 2017-11-21 DIAGNOSIS — F419 Anxiety disorder, unspecified: Secondary | ICD-10-CM | POA: Diagnosis present

## 2017-11-21 DIAGNOSIS — Z96652 Presence of left artificial knee joint: Secondary | ICD-10-CM | POA: Diagnosis not present

## 2017-11-21 DIAGNOSIS — E785 Hyperlipidemia, unspecified: Secondary | ICD-10-CM | POA: Diagnosis present

## 2017-11-21 DIAGNOSIS — M25562 Pain in left knee: Secondary | ICD-10-CM | POA: Diagnosis not present

## 2017-11-21 DIAGNOSIS — M1712 Unilateral primary osteoarthritis, left knee: Secondary | ICD-10-CM | POA: Diagnosis present

## 2017-11-21 DIAGNOSIS — Z90411 Acquired partial absence of pancreas: Secondary | ICD-10-CM | POA: Diagnosis not present

## 2017-11-21 DIAGNOSIS — Z7982 Long term (current) use of aspirin: Secondary | ICD-10-CM | POA: Diagnosis not present

## 2017-11-22 NOTE — Telephone Encounter (Signed)
Phone note shows that Dr. Nori Riis completed this surgery clearance form. Katharina Caper, Kimm Sider D, [place]

## 2017-11-27 DIAGNOSIS — Z9889 Other specified postprocedural states: Secondary | ICD-10-CM | POA: Diagnosis not present

## 2017-11-27 DIAGNOSIS — M25562 Pain in left knee: Secondary | ICD-10-CM | POA: Diagnosis not present

## 2017-11-27 DIAGNOSIS — Z96659 Presence of unspecified artificial knee joint: Secondary | ICD-10-CM | POA: Insufficient documentation

## 2017-11-27 DIAGNOSIS — S83282D Other tear of lateral meniscus, current injury, left knee, subsequent encounter: Secondary | ICD-10-CM | POA: Diagnosis not present

## 2017-11-27 DIAGNOSIS — R262 Difficulty in walking, not elsewhere classified: Secondary | ICD-10-CM | POA: Diagnosis not present

## 2017-12-01 DIAGNOSIS — Z96652 Presence of left artificial knee joint: Secondary | ICD-10-CM | POA: Diagnosis not present

## 2017-12-01 DIAGNOSIS — M25461 Effusion, right knee: Secondary | ICD-10-CM | POA: Diagnosis not present

## 2017-12-01 DIAGNOSIS — M25462 Effusion, left knee: Secondary | ICD-10-CM | POA: Diagnosis not present

## 2017-12-01 DIAGNOSIS — Z79899 Other long term (current) drug therapy: Secondary | ICD-10-CM | POA: Diagnosis not present

## 2017-12-01 DIAGNOSIS — I10 Essential (primary) hypertension: Secondary | ICD-10-CM | POA: Diagnosis not present

## 2017-12-01 DIAGNOSIS — E7800 Pure hypercholesterolemia, unspecified: Secondary | ICD-10-CM | POA: Diagnosis not present

## 2017-12-10 DIAGNOSIS — R600 Localized edema: Secondary | ICD-10-CM | POA: Diagnosis not present

## 2017-12-10 DIAGNOSIS — M25662 Stiffness of left knee, not elsewhere classified: Secondary | ICD-10-CM | POA: Diagnosis not present

## 2017-12-10 DIAGNOSIS — M25562 Pain in left knee: Secondary | ICD-10-CM | POA: Diagnosis not present

## 2017-12-14 DIAGNOSIS — M25662 Stiffness of left knee, not elsewhere classified: Secondary | ICD-10-CM | POA: Diagnosis not present

## 2017-12-14 DIAGNOSIS — M25562 Pain in left knee: Secondary | ICD-10-CM | POA: Diagnosis not present

## 2017-12-24 DIAGNOSIS — M25562 Pain in left knee: Secondary | ICD-10-CM | POA: Diagnosis not present

## 2017-12-24 DIAGNOSIS — M25662 Stiffness of left knee, not elsewhere classified: Secondary | ICD-10-CM | POA: Diagnosis not present

## 2018-01-01 DIAGNOSIS — Z96652 Presence of left artificial knee joint: Secondary | ICD-10-CM | POA: Diagnosis not present

## 2018-01-08 ENCOUNTER — Other Ambulatory Visit: Payer: Self-pay | Admitting: Orthopedic Surgery

## 2018-01-08 ENCOUNTER — Ambulatory Visit: Payer: Medicare Other

## 2018-01-08 DIAGNOSIS — Z96652 Presence of left artificial knee joint: Secondary | ICD-10-CM

## 2018-01-08 DIAGNOSIS — M7989 Other specified soft tissue disorders: Secondary | ICD-10-CM

## 2018-01-09 ENCOUNTER — Ambulatory Visit
Admission: RE | Admit: 2018-01-09 | Discharge: 2018-01-09 | Disposition: A | Discharge status: Home / Self Care | Payer: Medicare Other | Source: Ambulatory Visit | Attending: Orthopedic Surgery | Admitting: Orthopedic Surgery

## 2018-01-09 ENCOUNTER — Encounter (INDEPENDENT_AMBULATORY_CARE_PROVIDER_SITE_OTHER): Payer: Self-pay

## 2018-01-09 ENCOUNTER — Ambulatory Visit (INDEPENDENT_AMBULATORY_CARE_PROVIDER_SITE_OTHER): Payer: Medicare Other | Admitting: Family Medicine

## 2018-01-09 ENCOUNTER — Other Ambulatory Visit: Payer: Self-pay | Admitting: Family Medicine

## 2018-01-09 ENCOUNTER — Telehealth: Payer: Self-pay | Admitting: *Deleted

## 2018-01-09 ENCOUNTER — Other Ambulatory Visit: Payer: Self-pay

## 2018-01-09 VITALS — BP 160/82 | HR 87 | Temp 98.3°F | Ht 61.0 in | Wt 125.0 lb

## 2018-01-09 DIAGNOSIS — I82432 Acute embolism and thrombosis of left popliteal vein: Secondary | ICD-10-CM

## 2018-01-09 DIAGNOSIS — F4322 Adjustment disorder with anxiety: Secondary | ICD-10-CM | POA: Diagnosis not present

## 2018-01-09 DIAGNOSIS — M7989 Other specified soft tissue disorders: Secondary | ICD-10-CM | POA: Insufficient documentation

## 2018-01-09 DIAGNOSIS — I82A12 Acute embolism and thrombosis of left axillary vein: Secondary | ICD-10-CM

## 2018-01-09 DIAGNOSIS — F411 Generalized anxiety disorder: Secondary | ICD-10-CM | POA: Diagnosis not present

## 2018-01-09 DIAGNOSIS — I82402 Acute embolism and thrombosis of unspecified deep veins of left lower extremity: Secondary | ICD-10-CM | POA: Insufficient documentation

## 2018-01-09 DIAGNOSIS — Z96652 Presence of left artificial knee joint: Secondary | ICD-10-CM | POA: Diagnosis not present

## 2018-01-09 HISTORY — DX: Acute embolism and thrombosis of unspecified deep veins of left lower extremity: I82.402

## 2018-01-09 MED ORDER — ALPRAZOLAM 0.5 MG PO TABS: 0.5000 mg | ORAL_TABLET | Freq: Every evening | ORAL | 0 refills | Status: DC | PRN

## 2018-01-09 MED ORDER — RIVAROXABAN (XARELTO) VTE STARTER PACK (15 & 20 MG): ORAL_TABLET | ORAL | 0 refills | Status: DC

## 2018-01-09 NOTE — Assessment & Plan Note (Signed)
New problem - related to fear surrounding diagnosis of new DVT.  Patient has pre-existing anxiety disorder for which she had received alprazolam #30 tablets in 03/2017. Rx alprazolam 0.25 mg one tab qhs prn #15 tab. No RF.

## 2018-01-09 NOTE — Telephone Encounter (Signed)
Received call from [facility].  They performed a venous US and found a DVT in popliteal vein (results in Epic).  The MD there wanted [place] to start her on blood thinners.  Consulted with Dr. Gwendlyn Deutscher who wants her to come in on nurse visit and we will have preceptor see her and start blood thinners.  Pt informed and will be here @ 1:30. Crystal Cooke, Salome Spotted, CMA

## 2018-01-09 NOTE — Patient Instructions (Signed)
Crystal Cooke - The ultrasound of your leg showed a blood clot.  It is called a deep vein thrombosis or DVT in doctor jargon.  It is not uncommon after knee replacement.  The treatment of a leg DVT is a blood thinner.  Dr Malene Blaydes is prescribing Xarelto (rivaroxaban) as your blood thinner.   He sent in a Prescription for this new medicine to your pharmacy.   You will start by taking the 15 mg tablets, one tablet with food twice a day for 21 days.  On day 22, start taking a 20 mg tablet once a day until you have taken the Xarelto for a total of 3 months.

## 2018-01-09 NOTE — Assessment & Plan Note (Addendum)
New problem Initial DVT, (+) Provoked Low bleeding risk Normal SCr Wt ~55 Kg  Start rivaroxaban 15 mg twice a day with food for 21 days, the 20 mg daily with food for total 3 months of rivaroxaban therapy.  Recommend compression stocking left leg. RTC PCP one month.  Bleeding signs discussed.  Addendum: ICD10 for acute upper extremity DVT incorrectly  entered and associated with visit.  I am unable to cancel it.

## 2018-01-09 NOTE — Progress Notes (Signed)
   Subjective:   Crystal Cooke is accompanied by daughter Sources of clinical information for visit is/are patient, relative(s), past medical records and record from [facility] not available for visit, it was requested and reported as not completed. . Nursing assessment for this office visit was reviewed with the patient for accuracy and revision.  Previous Report(s) Reviewed: imaging reports: Venous US reprot from today  Depression screen PHQ 2/9 01/09/2018  Decreased Interest 0  Down, Depressed, Hopeless 0  PHQ - 2 Score 0   Fall Risk  01/09/2018 07/17/2017 06/08/2017 03/12/2017 11/07/2016  Falls in the past year? 0 No No No No  Number falls in past yr: 0 - - - -  Injury with Fall? 0 - - - -     Adult vaccines due  Topic Date Due  . TETANUS/TDAP  11/13/2025   There are no preventive care reminders to display for this patient.  Health Maintenance Due  Topic Date Due  . Hepatitis C Screening  08-06-1950  . DEXA SCAN  03/02/2014  . MAMMOGRAM  11/08/2017      Patient ID: Crystal Cooke, female    DOB: [birth year], 67 y.o.   MRN: [id number] Dgt are patient upset because they had not left this acute work-in appointment within one hour of arrival.  Both coming to door of preceptor room to complain to physician providing care that this visit was taking too long.   HPI  Left Leg Swelling - Onset after left knee replacement 11/21/17.  Swelling reduces with elevation. Mild pain in lower leg, nonspecific location - Worsened swelling in leg over last few days.   Went to orthopedist who performed Venous ultrasound left leg that showed a popliteal vein thrombosis.   - Denies shortness of breath. No chest pain. No cough/hemoptysis.  - No personal history of blood clots.  PMH lists history of afib - No FH of blood clots - No history of bleeding issues. No melena/hematochezia. Normal colonoscopy 2016.  - Takes aspirin for primary CV disease prevention  SH: No smoking, No lcohol   Review of  Systems No fever/chills See HPI    Objective:   Physical Exam VS reviewed GEN: Alert, Anxious, cooperative EXT: left calf circumference 2 cm greater than right. Trace pretibial edema left.  No right leg edema. No peripheral leg edema. Feet without deformity or lesions. Palpable bilateral pedal pulses. Left knee incision site healed. SKIN: No lesion nor rashes lower legs Gait: Ambulating with cane, decreased stance phase left leg.  Psych: Perseverating on having to leave quickly    Assessment & Plan:  Visit Problem List with A/P  Leg DVT (deep venous thromboembolism), acute, left ([place]), Provoked New problem Initial DVT, (+) Provoked Low bleeding risk Normal SCr Wt ~55 Kg  Start rivaroxaban 15 mg twice a day with food for 21 days, the 20 mg daily with food for total 3 months of rivaroxaban therapy.  Recommend compression stocking left leg. RTC PCP one month.  Bleeding signs discussed.  Adjustment disorder with anxious mood New problem - related to fear surrounding diagnosis of new DVT.  Patient has pre-existing anxiety disorder for which she had received alprazolam #30 tablets in 03/2017. Rx alprazolam 0.25 mg one tab qhs prn #15 tab. No RF.

## 2018-01-10 ENCOUNTER — Telehealth: Payer: Self-pay

## 2018-01-10 NOTE — Telephone Encounter (Signed)
Verbal order received from Dr. McDiarmid that it is okay to separate these prescriptions. [facility] aware. Danley Danker, RN [facility] RN)

## 2018-01-10 NOTE — Telephone Encounter (Signed)
Please resend the Xarelto as two separate prescriptions. The [facility] does not have it as it was sent as a combo pack.  Danley Danker, RN [facility] RN)

## 2018-01-18 ENCOUNTER — Ambulatory Visit: Payer: Medicare Other | Admitting: Family Medicine

## 2018-01-24 DIAGNOSIS — M1711 Unilateral primary osteoarthritis, right knee: Secondary | ICD-10-CM | POA: Diagnosis not present

## 2018-01-24 DIAGNOSIS — Z86718 Personal history of other venous thrombosis and embolism: Secondary | ICD-10-CM | POA: Diagnosis not present

## 2018-01-24 DIAGNOSIS — Z96652 Presence of left artificial knee joint: Secondary | ICD-10-CM | POA: Diagnosis not present

## 2018-01-25 DIAGNOSIS — M1711 Unilateral primary osteoarthritis, right knee: Secondary | ICD-10-CM | POA: Diagnosis not present

## 2018-02-01 ENCOUNTER — Other Ambulatory Visit: Payer: Self-pay | Admitting: Family Medicine

## 2018-02-07 ENCOUNTER — Other Ambulatory Visit: Payer: Self-pay

## 2018-02-07 ENCOUNTER — Encounter: Payer: Self-pay | Admitting: Student in an Organized Health Care Education/Training Program

## 2018-02-07 ENCOUNTER — Telehealth: Payer: Self-pay | Admitting: *Deleted

## 2018-02-07 ENCOUNTER — Ambulatory Visit (INDEPENDENT_AMBULATORY_CARE_PROVIDER_SITE_OTHER): Payer: Medicare Other | Admitting: Student in an Organized Health Care Education/Training Program

## 2018-02-07 VITALS — BP 124/72 | HR 79 | Temp 98.6°F | Ht 61.0 in | Wt 123.6 lb

## 2018-02-07 DIAGNOSIS — I82402 Acute embolism and thrombosis of unspecified deep veins of left lower extremity: Secondary | ICD-10-CM

## 2018-02-07 DIAGNOSIS — Z96652 Presence of left artificial knee joint: Secondary | ICD-10-CM

## 2018-02-07 MED ORDER — RIVAROXABAN 20 MG PO TABS: 20.0000 mg | ORAL_TABLET | Freq: Every day | ORAL | 0 refills | Status: DC

## 2018-02-07 NOTE — Patient Instructions (Signed)
It was a pleasure seeing you today in our clinic.  Here is the treatment plan we have discussed and agreed upon together:  A consult was placed to physical therapy at today's visit.  You will receive a call to schedule an appointment. If you do not receive a call within two weeks please call our office so we can place the consult again.  Our clinic's number is [phone]. Please call with questions or concerns about what we discussed today.  Be well, Dr. Burr Medico

## 2018-02-07 NOTE — Progress Notes (Signed)
Subjective:    Crystal Cooke - 68 y.o. female MRN [id number]  Date of birth: [birth year]  HPI  Crystal Cooke is here for counseling regarding her anticoagulation therapy.  Patient underwent total left knee replacement in October with complication of subsequent popliteal DVT. She was started on xarelto with 1 month at 15 mg BID dosing and now on the 20 mg daily maintenance dose. She endorses 100% compliance with the medication. She presents with questions regarding anticoagulation, whether she needs to adhere to a special diet while on xarelto. She reports that her knee has improved in swelling and remained warm since surgery but she has not experienced any fevers and has not felt systemically ill. She has very limited ROM at the knee but has not initiated PT yet.   Health Maintenance:  Health Maintenance Due  Topic Date Due  . Hepatitis C Screening  [birth year]  . DEXA SCAN  03/02/2014  . MAMMOGRAM  11/08/2017    -  reports that she has never smoked. She has never used smokeless tobacco. - Review of Systems: Per HPI. - Past Medical History: Patient Active Problem List   Diagnosis Date Noted  . Leg DVT (deep venous thromboembolism), acute, left ([place]), Provoked 01/09/2018  . Adjustment disorder with anxious mood 01/09/2018  . Unilateral primary osteoarthritis, right knee 07/06/2017  . Bilateral leg edema 06/08/2017  . Dislocation of right knee with lateral meniscus tear 01/16/2017  . Chronic pain of right knee 11/07/2016  . Torn rotator cuff 03/02/2016  . Essential hypertension 03/02/2016  . HLD (hyperlipidemia) 03/02/2016  . 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 Pancreatic cyst 08/28/2014  . Hepatic cyst 08/28/2014   - Medications: reviewed and updated Current Outpatient Medications  Medication Sig Dispense Refill  . acetaminophen (TYLENOL) 500 MG tablet Take 1,000 mg by mouth every 6 (six) hours as needed for mild pain.    Marland Kitchen aspirin EC 81 MG tablet Take 81 mg by mouth.    Marland Kitchen  atorvastatin (LIPITOR) 40 MG tablet Take 1 tablet (40 mg total) by mouth daily. 90 tablet 0  . CARTIA XT 300 MG 24 hr capsule Take 1 capsule (300 mg total) by mouth every morning. 90 capsule 3  . ciprofloxacin-dexamethasone (CIPRODEX) OTIC suspension Place 4 drops into both ears 2 (two) times daily. 7.5 mL 0  . diclofenac sodium (VOLTAREN) 1 % GEL Apply 2 g topically 4 (four) times daily. 100 g 3  . ibuprofen (ADVIL,MOTRIN) 800 MG tablet     . Multiple Vitamin (MULTIVITAMIN WITH MINERALS) TABS tablet Take 1 tablet by mouth every morning.     . Omega-3 Fatty Acids (FISH OIL) 1000 MG CAPS Take 1 capsule by mouth every morning.     . promethazine (PHENERGAN) 25 MG tablet Take 1 tablet (25 mg total) by mouth every 12 (twelve) hours. 20 tablet 0  . Red Yeast Rice 600 MG CAPS Take by mouth.    . rivaroxaban (XARELTO) 20 MG TABS tablet Take 1 tablet (20 mg total) by mouth daily with supper. 60 tablet 0   No current facility-administered medications for this visit.     Review of Systems See HPI     Objective:   Physical Exam BP 124/72   Pulse 79   Temp 98.6 F (37 C) (Oral)   Ht 5\' 1"  (1.549 m)   Wt 123 lb 9.6 oz (56.1 kg)   LMP 02/07/1999 (Approximate)   SpO2 97%   BMI 23.35 kg/m  GEN: NAD, alert, cooperative,  and pleasant. RESPIRATORY: Comfortable work of breathing, speaks in full sentences CV: Regular rate noted, distal extremities well perfused and warm without edema GI: Soft, nondistended SKIN: warm and dry, no rashes or lesions NEURO: II-XII grossly intact MSK: warm, moderately swollen Left knee is not tender to palpation. Well-healed surgical wound. Very limited ROM (~10 degrees in flexion). Negative Homan sign PSYCH: AAOx3, appropriate affect      Assessment & Plan:   Leg DVT (deep venous thromboembolism), acute, left ([facility]), Provoked Refilled 20 mg daily of xarelto, patient currently completing a 3 month course for provoked DVT (surgery). Will complete course in February.  PT ordered for limited ROM and rehab. -  No evidence of infection at this time.  - She continues to follow with surgery postoperatively. -  Return precautions advised. - answered all questions   Orders Placed This Encounter  Procedures  . Ambulatory referral to Physical Therapy    Referral Priority:   Routine    Referral Type:   Physical Medicine    Referral Reason:   Specialty Services Required    Requested Specialty:   Physical Therapy    Number of Visits Requested:   1    Meds ordered this encounter  Medications  . rivaroxaban (XARELTO) 20 MG TABS tablet    Sig: Take 1 tablet (20 mg total) by mouth daily with supper.    Dispense:  60 tablet    Refill:  0    Everrett Coombe, MD,MS,  PGY3 02/08/2018 2:49 PM

## 2018-02-07 NOTE — Telephone Encounter (Signed)
Pt wants bp meds refilled. Izsak Meir Kennon Holter, CMA

## 2018-02-08 ENCOUNTER — Other Ambulatory Visit: Payer: Self-pay | Admitting: Student in an Organized Health Care Education/Training Program

## 2018-02-08 DIAGNOSIS — I10 Essential (primary) hypertension: Secondary | ICD-10-CM

## 2018-02-08 MED ORDER — CARTIA XT 300 MG PO CP24: 300.0000 mg | ORAL_CAPSULE | Freq: Every morning | ORAL | 3 refills | Status: DC

## 2018-02-08 NOTE — Telephone Encounter (Signed)
Cartia refilled @[facility]  in [place]

## 2018-02-08 NOTE — Assessment & Plan Note (Signed)
Refilled 20 mg daily of xarelto, patient currently completing a 3 month course for provoked DVT (surgery). Will complete course in February. PT ordered for limited ROM and rehab. -  No evidence of infection at this time.  - She continues to follow with surgery postoperatively. -  Return precautions advised. - answered all questions

## 2018-02-13 DIAGNOSIS — N2889 Other specified disorders of kidney and ureter: Secondary | ICD-10-CM | POA: Diagnosis not present

## 2018-02-18 ENCOUNTER — Ambulatory Visit: Payer: Medicare Other | Admitting: Student in an Organized Health Care Education/Training Program

## 2018-02-22 ENCOUNTER — Ambulatory Visit: Payer: Medicare Other | Admitting: Physical Therapy

## 2018-02-26 ENCOUNTER — Encounter: Payer: Self-pay | Admitting: Student in an Organized Health Care Education/Training Program

## 2018-02-26 ENCOUNTER — Other Ambulatory Visit: Payer: Self-pay

## 2018-02-26 ENCOUNTER — Ambulatory Visit (INDEPENDENT_AMBULATORY_CARE_PROVIDER_SITE_OTHER): Payer: Medicare Other | Admitting: Student in an Organized Health Care Education/Training Program

## 2018-02-26 VITALS — BP 122/76 | HR 86 | Temp 98.5°F | Ht 61.0 in | Wt 126.0 lb

## 2018-02-26 DIAGNOSIS — Z129 Encounter for screening for malignant neoplasm, site unspecified: Secondary | ICD-10-CM | POA: Diagnosis not present

## 2018-02-26 DIAGNOSIS — Z1239 Encounter for other screening for malignant neoplasm of breast: Secondary | ICD-10-CM | POA: Diagnosis not present

## 2018-02-26 DIAGNOSIS — E2839 Other primary ovarian failure: Secondary | ICD-10-CM

## 2018-02-26 DIAGNOSIS — Z9189 Other specified personal risk factors, not elsewhere classified: Secondary | ICD-10-CM

## 2018-02-26 DIAGNOSIS — I82402 Acute embolism and thrombosis of unspecified deep veins of left lower extremity: Secondary | ICD-10-CM

## 2018-02-26 DIAGNOSIS — Z0000 Encounter for general adult medical examination without abnormal findings: Secondary | ICD-10-CM

## 2018-02-26 NOTE — Progress Notes (Signed)
   CC: Left knee swelling  HPI: Crystal Cooke is a 68 y.o. female with PMH:  Past Medical History:  Diagnosis Date  . Allergy   . Anxiety   . Atrial fibrillation ([place])    h/o a fib,  NSR with last EKG, has never seen a cardiologist per pt   . DDD (degenerative disc disease), lumbar   . Hypercholesteremia   . Hypertension   . Leg DVT (deep venous thromboembolism), acute, left ([place]) 01/09/2018    Presents for further anticoagulation counseling. Patient has known left DVT after left knee replacement. She is currently undergoing 3 months of xarelto therapy. She has presented previously for counseling regarding xarelto and all questions were answered at that time.  She presents again today and seems to have forgotten many aspects of our previous conversation. She is very concerned that her knee remains swollen. She did not go to physical therapy. She has not had fevers. She continues to have decreased ROM in her knee.   Overdue health maintenance Dexa form and mammogram form provided today  Review of Symptoms:  See HPI for ROS.   CC, SH/smoking status, and VS noted.  Objective: BP 122/76   Pulse 86   Temp 98.5 F (36.9 C) (Oral)   Ht 5\' 1"  (1.549 m)   Wt 126 lb (57.2 kg)   LMP 02/07/1999 (Approximate)   SpO2 97%   BMI 23.81 kg/m  GEN: NAD, alert, cooperative, and pleasant. RESPIRATORY: Comfortable work of breathing, speaks in full sentences CV: Regular rate noted, distal extremities well perfused and warm without edema GI: Soft, nondistended SKIN: warm and dry, no rashes or lesions NEURO: II-XII grossly intact MSK: Moves 4 extremities equally. +swelling noted around the right knee and in the right leg with some warmth, no surrounding erythema. No drainage. Dec ROM (<10 degrees) in flexion at the knee. PSYCH: AAOx3, appropriate affect   Assessment and plan:  Leg DVT (deep venous thromboembolism), acute, left ([place]), Provoked Answered all questions Explained mechanism of  xarelto to help prevent the clot  From growing, but her body has to absorb the current blood clot Continue xarelto She should go to PT for decreased ROM She continues to follow with orthopedics Mammogram ordered because patient should be UTD on age-appropriate cancer screening, especially in the setting of recent blood clot  Everrett Coombe, MD,MS,  PGY3 02/27/2018 3:47 PM

## 2018-02-26 NOTE — Patient Instructions (Signed)
It was a pleasure seeing you today in our clinic. Today we discussed your anticoagulation. You have a blood clot in your knee which will gradually be absorbed by your body over the next couple of months. Please continue to take Xarelto.  Please follow up with physical therapy to start treatment!  Our clinic's number is [phone]. Please call with questions or concerns about what we discussed today.  Be well, Dr. Burr Medico

## 2018-02-27 NOTE — Assessment & Plan Note (Signed)
Answered all questions Explained mechanism of xarelto to help prevent the clot  From growing, but her body has to absorb the current blood clot Continue xarelto She should go to PT for decreased ROM She continues to follow with orthopedics

## 2018-02-28 ENCOUNTER — Other Ambulatory Visit: Payer: Self-pay | Admitting: Student in an Organized Health Care Education/Training Program

## 2018-02-28 DIAGNOSIS — Z1231 Encounter for screening mammogram for malignant neoplasm of breast: Secondary | ICD-10-CM

## 2018-03-04 ENCOUNTER — Telehealth: Payer: Self-pay

## 2018-03-04 ENCOUNTER — Encounter: Payer: Self-pay | Admitting: Physical Therapy

## 2018-03-04 ENCOUNTER — Ambulatory Visit: Payer: Medicare Other | Attending: Family Medicine | Admitting: Physical Therapy

## 2018-03-04 DIAGNOSIS — M25662 Stiffness of left knee, not elsewhere classified: Secondary | ICD-10-CM | POA: Diagnosis not present

## 2018-03-04 DIAGNOSIS — R600 Localized edema: Secondary | ICD-10-CM

## 2018-03-04 DIAGNOSIS — R262 Difficulty in walking, not elsewhere classified: Secondary | ICD-10-CM

## 2018-03-04 DIAGNOSIS — M25562 Pain in left knee: Secondary | ICD-10-CM | POA: Insufficient documentation

## 2018-03-04 NOTE — Telephone Encounter (Signed)
Scheduled pt to see PCP on 2/11 @210pm .

## 2018-03-04 NOTE — Therapy (Addendum)
[place], [place], [address] Phone: [phone]   Fax:  [phone]  Physical Therapy Evaluation / Discharge summary  Patient Details  Name: Crystal Cooke MRN: [id number] Date of Birth: 04-Feb-1951 Referring Provider (PT): Everrett Coombe, MD REsident   Encounter Date: 03/04/2018  PT End of Session - 03/04/18 1247    Visit Number  1    Number of Visits  18    Date for PT Re-Evaluation  04/29/18    Authorization Type  MCR/MCD    PT Start Time  1145    PT Stop Time  1235    PT Time Calculation (min)  50 min    Activity Tolerance  Patient tolerated treatment well    Behavior During Therapy  [facility] for tasks assessed/performed       Past Medical History:  Diagnosis Date  . Allergy   . Anxiety   . Atrial fibrillation ([place])    h/o a fib,  NSR with last EKG, has never seen a cardiologist per pt   . DDD (degenerative disc disease), lumbar   . Hypercholesteremia   . Hypertension   . Leg DVT (deep venous thromboembolism), acute, left ([place]) 01/09/2018    Past Surgical History:  Procedure Laterality Date  . COLONOSCOPY  05/05/14   Dr.Benson- normal colonoscopy, retroflexed views revealed no abnormalities.   . ESOPHAGOGASTRODUODENOSCOPY  05/05/14   Dr.Benson- normal EGD, retroflexed views revealed no abnormalities   . EUS N/A 09/24/2014   Procedure: UPPER ENDOSCOPIC ULTRASOUND (EUS) RADIAL;  Surgeon: Milus Banister, MD;  Location: [facility];  Service: Endoscopy;  Laterality: N/A;  . KNEE ARTHROSCOPY Right 01/05/2017   Procedure: RIGHT KNEE ARTHROSCOPY, PARTIAL LATERAL MENISCECTOMY,;  Surgeon: Marybelle Killings, MD;  Location: [facility];  Service: Orthopedics;  Laterality: Right;  . none    . PANCREAS SURGERY     partial    There were no vitals filed for this visit.   Subjective Assessment - 03/04/18 1157    Subjective  Pt relays she had Lt TKA in october 2019 that was complicated by DVT. She  has not moved her knee much since and has not had PT. She is now on blood thinners and PCP has now cleared her for PT to work on ROM as this is very limited now    Pertinent History  PMH: Lt TKA and DVT, anx, Rt RTC trear, Pancreatic cancer but had half of her panreas removed.     Limitations  Sitting;Standing;Lifting;Walking    How long can you stand comfortably?  15 min    How long can you walk comfortably?  5-10 min    Diagnostic tests  [place]S. showing DVT    Patient Stated Goals  get my knee to bend more and walk better    Currently in Pain?  Yes    Pain Score  8     Pain Location  Knee    Pain Orientation  Left    Pain Descriptors / Indicators  Aching;Throbbing    Pain Type  Surgical pain    Pain Radiating Towards  Lt leg    Pain Onset  More than a month ago    Pain Frequency  Constant    Aggravating Factors   any activity    Pain Relieving Factors  heat    Multiple Pain Sites  No         [facility] PT Assessment - 03/04/18 0001  Assessment   Medical Diagnosis  Lt knee pain, S/P Lt TKA(Oct 5883) with DVT complication    Referring Provider (PT)  Everrett Coombe, MD REsident    Onset Date/Surgical Date  --   Judge Stall   Next MD Visit  not scheduled    Prior Therapy  none      Precautions   Precaution Comments  Lt leg DVT      Restrictions   Weight Bearing Restrictions  No      Balance Screen   Has the patient fallen in the past 6 months  No      [place] residence    Additional Comments  apartment, 4 and then 3 steps with handrail      Prior Function   Level of Independence  Independent with basic ADLs   now needs help with groceries, and does not drive much   Vocation  Retired    Biomedical scientist  from [employer]   Overall Cognitive Status  Within Functional Limits for tasks assessed      Observation/Other Assessments   Focus on Therapeutic Outcomes (FOTO)   68% limited      Sensation   Light Touch  Appears  Intact      Coordination   Gross Motor Movements are Fluid and Coordinated  Yes      ROM / Strength   AROM / PROM / Strength  AROM;PROM;Strength      AROM   AROM Assessment Site  Knee    Right/Left Knee  Left    Left Knee Extension  -10    Left Knee Flexion  65      PROM   PROM Assessment Site  Knee    Right/Left Knee  Left    Left Knee Extension  -5    Left Knee Flexion  75      Strength   Overall Strength Comments  Lt hip/knee 4+/5 bilat grossly tested in sitting      Flexibility   Soft Tissue Assessment /Muscle Length  --   severe tightness in Lt knee quads and H.S     Palpation   Patella mobility  hypomobiliy all planes    Palpation comment  TTP anterior and posterior knee      Special Tests   Other special tests  edema measurment at jt line Lt knee 14.5 inch vs Rt knee 12.75 inch      Ambulation/Gait   Gait Comments  abnormal gait with decreased Lt hip/knee flexion casusing foot ER, limp, and decreased step length and heel strike                Objective measurements completed on examination: See above findings.      [facility] Adult PT Treatment/Exercise - 03/04/18 0001      Exercises   Exercises  Knee/Hip      Knee/Hip Exercises: Stretches   Other Knee/Hip Stretches  AAROM heel slides 5 sec X 15      Modalities   Modalities  Cryotherapy      Cryotherapy   Number Minutes Cryotherapy  7 Minutes    Cryotherapy Location  Knee    Type of Cryotherapy  Ice pack      Manual Therapy   Manual therapy comments  PROM, patella mobs, knee mobs to inc flexion             PT Education - 03/04/18 1246  Education Details  HEP, POC, need for gentle consistent ROM    Person(s) Educated  Patient    Methods  Explanation;Demonstration;Verbal cues;Handout    Comprehension  Verbalized understanding;Need further instruction       PT Short Term Goals - 03/04/18 1253      PT SHORT TERM GOAL #1   Title  Pt will be I and compliant with HEP. 4 weeks  04/04/18    Status  New        PT Long Term Goals - 03/04/18 1253      PT LONG TERM GOAL #1   Title  Pt will increase Lt knee flexion ROM >105 deg to improve function. 8 weeks 04/29/18    Status  New      PT LONG TERM GOAL #2   Title  Pt will improve Lt knee strength to 5/5 MMT. 8 weeks 04/29/18    Status  New      PT LONG TERM GOAL #3   Title  Pt will improve gait to [facility] gait pattern and increase to community distances at least 400 ft. 8 weeks 04/29/18    Status  New      PT LONG TERM GOAL #4   Title  Pt will improve FOTO to less than 52% limited    Status  New      PT LONG TERM GOAL #5   Title  Pt will report overall less than 3-4/10 pain with usual activity and stairs.     Status  New             Plan - 03/04/18 1249    Clinical Impression Statement  Pt presents with Lt knee pain and severe ROM limitations, S/P Lt TKA(Oct 2563) with DVT complication. She is now on blood thinners and cleared by PCP to being PT for ROM. She has decreased ROM, patella hypermobility, decreased strength, increased pain and swelling, and difficulty walking. She will benefit from skilled PT to address these deficits.     History and Personal Factors relevant to plan of care:  PMH: Lt TKA and DVT, anx, Rt RTC trear, Pancreatic cancer but had half of her panreas removed.     Clinical Presentation  Evolving    Clinical Presentation due to:  DVT complication and knee flexion contracture    Clinical Decision Making  Moderate    Rehab Potential  Good    Clinical Impairments Affecting Rehab Potential  DVT complication and knee flexion contracture    PT Frequency  3x / week   2-3   PT Duration  8 weeks    PT Treatment/Interventions  Cryotherapy;Electrical Stimulation;Moist Heat;Ultrasound;Gait training;Stair training;Functional mobility training;Therapeutic activities;Therapeutic exercise;Neuromuscular re-education;Manual techniques;Passive range of motion;Taping;Dry needling;Vasopneumatic Device;Joint  Manipulations    PT Next Visit Plan  review HEP, progress ROM as albe    PT Home Exercise Plan  heel slides, HSS, sitting and supine  knee flexion ROM AAROM, LAQ, quadset    Consulted and Agree with Plan of Care  Patient       Patient will benefit from skilled therapeutic intervention in order to improve the following deficits and impairments:  Abnormal gait, Decreased activity tolerance, Decreased endurance, Decreased range of motion, Decreased strength, Difficulty walking, Hypomobility, Pain  Visit Diagnosis: Acute pain of left knee  Stiffness of left knee, not elsewhere classified  Difficulty in walking, not elsewhere classified  Localized edema     Problem List Patient Active Problem List   Diagnosis Date Noted  . Leg  DVT (deep venous thromboembolism), acute, left ([place]), Provoked 01/09/2018  . Adjustment disorder with anxious mood 01/09/2018  . Unilateral primary osteoarthritis, right knee 07/06/2017  . Dislocation of right knee with lateral meniscus tear 01/16/2017  . Chronic pain of right knee 11/07/2016  . Torn rotator cuff 03/02/2016  . Essential hypertension 03/02/2016  . HLD (hyperlipidemia) 03/02/2016  . 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 Pancreatic cyst 08/28/2014  . Hepatic cyst 08/28/2014    Silvestre Mesi 03/04/2018, 12:57 PM  [facility] [address] [place], [place], [address] Phone: [phone]   Fax:  [phone]  Name: Crystal Cooke MRN: [id number]  Date of Birth: 1950/12/06        PHYSICAL THERAPY DISCHARGE SUMMARY  Visits from Start of Care: 1  Current functional level related to goals / functional outcomes: See goals   Remaining deficits: unknown   Education / Equipment: HEP  Plan:                                                    Patient goals were not met. Patient is being discharged due to not returning since the last visit.  ?????    Kristoffer Leamon PT, DPT, LAT,  ATC  04/16/18  1:47 PM

## 2018-03-04 NOTE — Telephone Encounter (Signed)
An MRI is not indicated at this time. She is post-op and has a known blood clot which are the likely causes of her stable knee pain and inflammation.  - She should start PT as advised to start regaining strength and mobility of the leg - If there has been a change it would be reasonable to schedule an OV for re-evaluation - agree with giving ED precautions.

## 2018-03-04 NOTE — Patient Instructions (Signed)
Access Code: [email or web address]  URL: [email or web address]  Date: 03/04/2018  Prepared by: Elsie Ra   Exercises  Supine Quad Set - 10 reps - 2-3 sets - 5 sec hold - 2x daily - 6x weekly  Supine Hamstring Stretch with Strap - 3 sets - 30 hold - 2x daily - 6x weekly  Supine Heel Slide with Strap - 10 reps - 2-3 sets - 5 hold - 2x daily - 6x weekly  Seated Knee Flexion AAROM - 10 reps - 2 sets - 5 hold - 2x daily - 6x weekly  Seated Long Arc Quad - 10 reps - 2-3 sets - 3 hold - 2x daily - 6x weekly  Supine Knee Flexion supine AAROM at Wall - 3 sets - 1-2 min hold - 2x daily - 6x weekly

## 2018-03-04 NOTE — Telephone Encounter (Signed)
Pt calls nurse line stating she wants her pcp to order her a MRI for her knee. Pt stated she feels something else is going on with her knee, besides the recent blood clot. Pt stated her knee hurts so bad all the time and is inflamed. Pt denies any SOB and she is still taking her blood thinner as prescribed. I offered pt an apt to be evaluated, she denied. "I just want an MRI, I know my body and I know something is wrong." ED precautions given to her. Please advise.

## 2018-03-05 ENCOUNTER — Ambulatory Visit: Payer: Medicare Other | Admitting: Student in an Organized Health Care Education/Training Program

## 2018-03-07 DIAGNOSIS — N2889 Other specified disorders of kidney and ureter: Secondary | ICD-10-CM | POA: Insufficient documentation

## 2018-03-07 DIAGNOSIS — R319 Hematuria, unspecified: Secondary | ICD-10-CM | POA: Diagnosis not present

## 2018-03-07 DIAGNOSIS — N390 Urinary tract infection, site not specified: Secondary | ICD-10-CM | POA: Diagnosis not present

## 2018-03-08 ENCOUNTER — Telehealth: Payer: Self-pay | Admitting: Student in an Organized Health Care Education/Training Program

## 2018-03-08 NOTE — Telephone Encounter (Signed)
Called and asked patient if she would like a spot in my [facility] clinic on Monday 2/3 but she is not available. If she calls back she can have any of the spots in my clinic on Monday afternon.

## 2018-03-12 ENCOUNTER — Ambulatory Visit: Payer: Medicare Other | Admitting: Physical Therapy

## 2018-03-13 ENCOUNTER — Encounter: Payer: Medicare Other | Admitting: Physical Therapy

## 2018-03-15 ENCOUNTER — Ambulatory Visit: Payer: Medicare Other | Admitting: Physical Therapy

## 2018-03-18 ENCOUNTER — Other Ambulatory Visit: Payer: Self-pay

## 2018-03-18 ENCOUNTER — Encounter (HOSPITAL_COMMUNITY): Payer: Self-pay

## 2018-03-18 ENCOUNTER — Telehealth: Payer: Self-pay | Admitting: Physical Therapy

## 2018-03-18 ENCOUNTER — Ambulatory Visit: Payer: Medicare Other | Attending: Family Medicine | Admitting: Physical Therapy

## 2018-03-18 ENCOUNTER — Emergency Department (HOSPITAL_COMMUNITY)
Admission: EM | Admit: 2018-03-18 | Discharge: 2018-03-18 | Disposition: A | Discharge status: Home / Self Care | Payer: Medicare Other | Attending: Emergency Medicine | Admitting: Emergency Medicine

## 2018-03-18 DIAGNOSIS — Z7982 Long term (current) use of aspirin: Secondary | ICD-10-CM | POA: Diagnosis not present

## 2018-03-18 DIAGNOSIS — I10 Essential (primary) hypertension: Secondary | ICD-10-CM | POA: Diagnosis not present

## 2018-03-18 DIAGNOSIS — D689 Coagulation defect, unspecified: Secondary | ICD-10-CM | POA: Diagnosis not present

## 2018-03-18 DIAGNOSIS — Z79899 Other long term (current) drug therapy: Secondary | ICD-10-CM | POA: Diagnosis not present

## 2018-03-18 DIAGNOSIS — Z7901 Long term (current) use of anticoagulants: Secondary | ICD-10-CM | POA: Insufficient documentation

## 2018-03-18 DIAGNOSIS — K068 Other specified disorders of gingiva and edentulous alveolar ridge: Secondary | ICD-10-CM | POA: Insufficient documentation

## 2018-03-18 MED ORDER — SILVER NITRATE-POT NITRATE 75-25 % EX MISC
1.0000 | Freq: Once | CUTANEOUS | Status: AC
  Administered 2018-03-18: 1 via TOPICAL

## 2018-03-18 MED ORDER — SILVER NITRATE-POT NITRATE 75-25 % EX MISC
CUTANEOUS | Status: AC
  Administered 2018-03-18: 1 via TOPICAL
  Filled 2018-03-18: qty 1

## 2018-03-18 NOTE — Discharge Instructions (Addendum)
Please leave your partials out for the next couple days so that you do not irritate the sites that were bleeding.  Hold pressure over the areas that are bleeding constantly for 5 minutes and the bleeding should stop.  You can also put a ice pack over your mouth.  Please do not take any Xarelto today, Monday, February 10.  Restart your usual dose tomorrow, Tuesday, February 11.

## 2018-03-18 NOTE — Telephone Encounter (Signed)
Pt no show for PT appointment today but chart review did show they where in ER earlier this morning. They where contacted and informed of this with voicemail left along with their next appointment timeand date.  Elsie Ra, PT, DPT 03/18/18 3:08 PM

## 2018-03-18 NOTE — ED Provider Notes (Signed)
[facility] EMERGENCY DEPARTMENT Provider Note   CSN: [id number] Arrival date & time: 03/18/18  0057  Time seen 2:40 AM   History   Chief Complaint Chief Complaint  Patient presents with  . Coagulation Disorder    HPI Crystal Cooke is a 68 y.o. female.  HPI patient states she is on Xarelto.  She states she normally takes it at 6 AM however on February 8 she accidentally took 2 pills.  Today, February 9 she waited until 8 PM and took her usual morning dose.  She states she went to bed about 9 PM and 11 AM she woke up with some blood in her mouth.  She states she has 2 places one on the right one her left where she is having some bleeding from her gums.  She states she called poison control and she wants blood work done to check her blood.  PCP Everrett Coombe, MD   Past Medical History:  Diagnosis Date  . Allergy   . Anxiety   . Atrial fibrillation ([place])    h/o a fib,  NSR with last EKG, has never seen a cardiologist per pt   . DDD (degenerative disc disease), lumbar   . Hypercholesteremia   . Hypertension   . Leg DVT (deep venous thromboembolism), acute, left ([place]) 01/09/2018    Patient Active Problem List   Diagnosis Date Noted  . Leg DVT (deep venous thromboembolism), acute, left ([place]), Provoked 01/09/2018  . Adjustment disorder with anxious mood 01/09/2018  . Unilateral primary osteoarthritis, right knee 07/06/2017  . Dislocation of right knee with lateral meniscus tear 01/16/2017  . Chronic pain of right knee 11/07/2016  . Torn rotator cuff 03/02/2016  . Essential hypertension 03/02/2016  . HLD (hyperlipidemia) 03/02/2016  . 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 Pancreatic cyst 08/28/2014  . Hepatic cyst 08/28/2014    Past Surgical History:  Procedure Laterality Date  . COLONOSCOPY  05/05/14   Dr.Benson- normal colonoscopy, retroflexed views revealed no abnormalities.   . ESOPHAGOGASTRODUODENOSCOPY  05/05/14   Dr.Benson- normal EGD, retroflexed views revealed no  abnormalities   . EUS N/A 09/24/2014   Procedure: UPPER ENDOSCOPIC ULTRASOUND (EUS) RADIAL;  Surgeon: Milus Banister, MD;  Location: [facility];  Service: Endoscopy;  Laterality: N/A;  . KNEE ARTHROSCOPY Right 01/05/2017   Procedure: RIGHT KNEE ARTHROSCOPY, PARTIAL LATERAL MENISCECTOMY,;  Surgeon: Marybelle Killings, MD;  Location: [facility];  Service: Orthopedics;  Laterality: Right;  . none    . PANCREAS SURGERY     partial     OB History    Gravida  5   Para  5   Term  5   Preterm      AB      Living  5     SAB      TAB      Ectopic      Multiple      Live Births               Home Medications    Prior to Admission medications   Medication Sig Start Date End Date Taking? Authorizing Provider  acetaminophen (TYLENOL) 500 MG tablet Take 1,000 mg by mouth every 6 (six) hours as needed for mild pain.    [provider]  aspirin EC 81 MG tablet Take 81 mg by mouth.    [provider]  atorvastatin (LIPITOR) 40 MG tablet Take 1 tablet (40 mg total) by mouth daily. 04/03/17  Everrett Coombe, MD  CARTIA XT 300 MG 24 hr capsule Take 1 capsule (300 mg total) by mouth every morning. 02/08/18   Everrett Coombe, MD  ciprofloxacin-dexamethasone (CIPRODEX) OTIC suspension Place 4 drops into both ears 2 (two) times daily. 07/17/17   Nicolette Bang, DO  diclofenac sodium (VOLTAREN) 1 % GEL Apply 2 g topically 4 (four) times daily. 10/18/17   Everrett Coombe, MD  ibuprofen (ADVIL,MOTRIN) 800 MG tablet  02/03/17   [provider]  Multiple Vitamin (MULTIVITAMIN WITH MINERALS) TABS tablet Take 1 tablet by mouth every morning.     [provider]  Omega-3 Fatty Acids (FISH OIL) 1000 MG CAPS Take 1 capsule by mouth every morning.     [provider]  promethazine (PHENERGAN) 25 MG tablet Take 1 tablet (25 mg total) by mouth every 12 (twelve) hours. 01/16/17   Marybelle Killings, MD  Red Yeast Rice 600 MG CAPS Take by mouth.     [provider]  rivaroxaban (XARELTO) 20 MG TABS tablet Take 1 tablet (20 mg total) by mouth daily with supper. 02/07/18   Everrett Coombe, MD    Family History Family History  Problem Relation Age of Onset  . Lung cancer Mother           . Hypertension Mother   . Cancer Mother   . Diabetes Father   . Hypertension Father   . Lung cancer Father        deceased age 98  . Cancer Father   . Colon cancer Sister        deceased 2014/05/27, age 48  . Pancreatic cancer Neg Hx     Social History Social History   Tobacco Use  . Smoking status: Never Smoker  . Smokeless tobacco: Never Used  Substance Use Topics  . Alcohol use: No  . Drug use: No     Allergies   Levaquin [levofloxacin]; Oxycodone; and Statins   Review of Systems Review of Systems  All other systems reviewed and are negative.    Physical Exam Updated Vital Signs BP (!) 144/79 (BP Location: Right Arm)   Pulse 85   Temp 98.4 F (36.9 C) (Oral)   Resp 18   Ht 5\' 1"  (1.549 m)   Wt 57.2 kg   LMP 02/07/1999 (Approximate)   SpO2 99%   BMI 23.81 kg/m   Vital signs normal    Physical Exam Vitals signs and nursing note reviewed.  Constitutional:      Appearance: Normal appearance.  HENT:     Head: Normocephalic and atraumatic.     Right Ear: External ear normal.     Left Ear: External ear normal.     Nose: Nose normal.     Mouth/Throat:     Comments: Patient is missing her upper incisors, she has her cuspid and first molars on either side.  At the gumline between her cuspid and first molar on the right there is a small oozing of blood without obvious lesions seen.  When I examined the teeth on the left I do not see any active bleeding at this time or aches source of the bleeding. Eyes:     Extraocular Movements: Extraocular movements intact.     Conjunctiva/sclera: Conjunctivae normal.  Neck:     Musculoskeletal: Normal range of motion.  Cardiovascular:     Rate and Rhythm: Normal rate.    Pulmonary:     Effort: Pulmonary effort is normal. No respiratory distress.  Neurological:  General: No focal deficit present.     Mental Status: She is alert and oriented to person, place, and time.     Cranial Nerves: No cranial nerve deficit.  Psychiatric:        Mood and Affect: Mood is anxious.        Speech: Speech normal.        Behavior: Behavior normal.      ED Treatments / Results  Labs (all labs ordered are listed, but only abnormal results are displayed) Labs Reviewed - No data to display  EKG None  Radiology No results found.  Procedures Procedures (including critical care time)  Medications Ordered in ED Medications  silver nitrate applicators applicator 1 Stick (1 Stick Topical Given 03/18/18 0250)     Initial Impression / Assessment and Plan / ED Course  I have reviewed the triage vital signs and the nursing notes.  Pertinent labs & imaging results that were available during my care of the patient were reviewed by me and considered in my medical decision making (see chart for details).     40 5 AM silver nitrate was applied to the oozing area on the right side.  Patient continues to persist and wanting blood work done to see how thinner blood he is in "I do not know why I am bleeding".  I tried to explain to her she took extra dose of her medicine but that does not seem to calm her down.  Patient was asked to not wear her partials because this will irritate her gums that are already bleeding.  She should miss her dose of Xarelto today and restart it again the following day.  She was advised to put pressure on the area that is bleeding if it should recur.  Patient has not had enough bleeding to be concerned about having a new anemia.  She certainly does not have criteria to get the reversal agent so blood work was not done.  Final Clinical Impressions(s) / ED Diagnoses   Final diagnoses:  Gums, bleeding  Coagulopathy [facility])    ED Discharge Orders     None     Plan discharge  Rolland Porter, MD, Barbette Or, MD 03/18/18 0330

## 2018-03-18 NOTE — ED Triage Notes (Signed)
Pt from home, taking Xarelto for blood clot treatment and woke up tonight with gums bleeding on anterior upper palate where Partial is present. Pt denies N/V denies pain.

## 2018-03-18 NOTE — ED Notes (Signed)
ED Provider at bedside. 

## 2018-03-19 ENCOUNTER — Encounter: Payer: Self-pay | Admitting: Student in an Organized Health Care Education/Training Program

## 2018-03-19 ENCOUNTER — Ambulatory Visit (INDEPENDENT_AMBULATORY_CARE_PROVIDER_SITE_OTHER): Payer: Medicare Other | Admitting: Student in an Organized Health Care Education/Training Program

## 2018-03-19 ENCOUNTER — Other Ambulatory Visit: Payer: Self-pay

## 2018-03-19 ENCOUNTER — Ambulatory Visit (INDEPENDENT_AMBULATORY_CARE_PROVIDER_SITE_OTHER): Payer: Medicare Other | Admitting: Licensed Clinical Social Worker

## 2018-03-19 VITALS — BP 120/74 | HR 86 | Temp 98.6°F | Ht 61.0 in | Wt 125.5 lb

## 2018-03-19 DIAGNOSIS — E2839 Other primary ovarian failure: Secondary | ICD-10-CM

## 2018-03-19 DIAGNOSIS — I82402 Acute embolism and thrombosis of unspecified deep veins of left lower extremity: Secondary | ICD-10-CM | POA: Diagnosis not present

## 2018-03-19 DIAGNOSIS — F411 Generalized anxiety disorder: Secondary | ICD-10-CM

## 2018-03-19 DIAGNOSIS — F4322 Adjustment disorder with anxiety: Secondary | ICD-10-CM

## 2018-03-19 MED ORDER — SERTRALINE HCL 25 MG PO TABS: 25.0000 mg | ORAL_TABLET | Freq: Every day | ORAL | 0 refills | Status: DC

## 2018-03-19 NOTE — BH Specialist Note (Signed)
Integrated Behavioral Health Initial Visit  MRN: [id number] Name: Crystal Cooke  Session Start time: 2:15  Session End time: 2:30 Total time: 15 minutes Type of Service: Integrated Behavioral Health  Warm Hand Off Completed.      Patient verbally consented to meet with [facility] Consultant about presenting concerns. SUBJECTIVE: Crystal Cooke is a 68 y.o. female referred by Dr. Burr Medico for assistance with managing symptoms of anxiety  Report of symptoms: feeling anxious, constant worry about health, tearful,   ASSESSMENT: Mood: Euthymic and Affect: Appropriate and Tearful at times.  Patient is pleasant and engaged in conversation.  She currently experiencing symptoms of anxiety which are exacerbated by chronic medical concerns. GAD scores below do not fully represent the current and future state of patient.  Patient may benefit from and is in agreement to receive further assessment and brief therapeutic interventions to assist with managing her symptoms.  She reports wanting to get better, stop worrying and get back to doing things that she enjoys. GOALS: Patient will: 1. Reduce symptoms of: anxiety 2. Increase knowledge and/or ability of: coping skills, self-management skills and stress reduction  3. Improve / getting back her quality of life PLAN: 1. Follow up with LCSW when returns to see PCP 2. Implement relaxed breathing 3 times daily 3. LCSW will F/U with phone call in 1 week _______________________________________________________ Duration of CURRENT symptoms:on an off for years, notices increase in symptoms in Oct after surgery  Impact on function:decreased in her quality of life, limitations with walking Psychiatric History - Diagnoses:adjustment disorder with anxious mood - Hospitalizations:  none - Pharmacotherapy: start zoloft 25mg  today - Outpatient therapy: none  LIFE CONTEXT: Family and Social: lives alone, has 2 children that live local, used Medicaid  transportation to appointments Self-Care: none at this time Life Changes: blood clot in leg and surgery Oct. 2019  INTERVENTION: Client interviewed and appropriate assessments performed.  Provided client with supportive counseling and demonstration of relaxed breathing as well as psycho education.  GAD 7 : Generalized Anxiety Score 03/19/2018  Nervous, Anxious, on Edge 1  Control/stop worrying 1  Worry too much - different things 1  Trouble relaxing 1  Restless 0  Easily annoyed or irritable 0  Afraid - awful might happen 0  Total GAD 7 Score 4  Anxiety Difficulty Somewhat difficult     Crystal Cooke, [place]   [phone] 2:56 PM

## 2018-03-19 NOTE — Assessment & Plan Note (Addendum)
Continue Xarelto.  Patient advised to take her pill when she gets home today.  She may resume every morning dosing tomorrow morning.  Reviewed risks and benefits of Coumadin versus Xarelto, patient elects to continue with Xarelto.  Last dose will be 3/4.  She went to PT x1 and then cancelled all further treatments. Advised her to please call back and reschedule PT. Reviewed importance of strengthening and ROM exercises in order to gain back function in the knee. She expresses understanding.

## 2018-03-19 NOTE — Patient Instructions (Signed)
Please schedule follow up to be seen next week or in 2 weeks.

## 2018-03-19 NOTE — Assessment & Plan Note (Addendum)
Patient has anxiety specifically surrounding her DVT/knee swelling/leg weakness.  Behavioral health worked with the patient at today's visit.  See separate note for details.  Relaxation techniques were reviewed.  Zoloft 25 mg daily was started at today's visit, will likely need titrating up at the next visit.  Patient requires frequent follow-up given anxiety surrounding her current diagnosis and treatment.

## 2018-03-19 NOTE — Progress Notes (Signed)
CC: Anxiety, DVT, took too many Xarelto  HPI: Crystal Cooke is a 68 y.o. female with PMH significant for L DVT after knee replacement.   Patient was seen in the ED yesterday because she accidentally took two doses of her eliquis in one day on February 8. On February 9, she waited until 8 PM and took her usual morning dose. She went to ED for bleeding in her gums and called poison control.  Timeline for xarelto: - February 8th: Accidentally took 2 doses of xarelto - February 9th: took one dose of xarelto at [place] started bleeding. Called poison control. Went to ED. - February 10th: did not take any Xarelto  Today is February 11th. She did not take any xarelto today because she wanted to discuss it at our [place]. Her gums have stopped bleeding. She asks about risks vs benefits of taking Coumadin instead of Xarelto.  She has a lot of anxiety surrounding her knee swelling and her blood thinner.  Is currently on a 35-month course of Xarelto with the last dose on 3/4.  Review of Symptoms:  See HPI for ROS.   CC, SH/smoking status, and VS noted.  Objective: BP 120/74   Pulse 86   Temp 98.6 F (37 C) (Oral)   Ht 5\' 1"  (1.549 m)   Wt 56.9 kg   LMP 02/07/1999 (Approximate)   SpO2 97%   BMI 23.71 kg/m  GEN: NAD, alert, cooperative, and pleasant. RESPIRATORY: Comfortable work of breathing, speaks in full sentences CV: Regular rate noted, distal extremities well perfused and warm without edema GI: Soft, nondistended SKIN: warm and dry, no rashes or lesions NEURO: II-XII grossly intact MSK: Moves 4 extremities equally PSYCH: AAOx3, appropriate affect  Assessment and plan:  Anxiety state Patient has anxiety specifically surrounding her DVT/knee swelling/leg weakness.  Behavioral health worked with the patient at today's visit.  See separate note for details.  Relaxation techniques were reviewed.  Zoloft 25 mg daily was started at today's visit, will likely need titrating up at the next  visit.  Patient requires frequent follow-up given anxiety surrounding her current diagnosis and treatment.  Leg DVT (deep venous thromboembolism), acute, left ([place]), Provoked Continue Xarelto.  Patient advised to take her pill when she gets home today.  She may resume every morning dosing tomorrow morning.  Reviewed risks and benefits of Coumadin versus Xarelto, patient elects to continue with Xarelto.  Last dose will be 3/4.  She went to PT x1 and then cancelled all further treatments. Advised her to please call back and reschedule PT. Reviewed importance of strengthening and ROM exercises in order to gain back function in the knee. She expresses understanding.  Ordered Dexa, health maintenance that is due.  Orders Placed This Encounter  Procedures  . DG Bone Density    Order if no menses in >6 months. Route chart to RN to schedule    Standing Status:   Future    Standing Expiration Date:   05/18/2019    Order Specific Question:   Reason for Exam (SYMPTOM  OR DIAGNOSIS REQUIRED)    Answer:   amenorrhea    Order Specific Question:   Preferred imaging location?    Answer:   [facility]    Meds ordered this encounter  Medications  . sertraline (ZOLOFT) 25 MG tablet    Sig: Take 1 tablet (25 mg total) by mouth daily.    Dispense:  30 tablet    Refill:  0  Everrett Coombe, MD,MS,  PGY3 03/19/2018 3:08 PM

## 2018-03-20 ENCOUNTER — Ambulatory Visit: Payer: Medicare Other | Admitting: Physical Therapy

## 2018-03-22 ENCOUNTER — Ambulatory Visit: Payer: Medicare Other | Admitting: Physical Therapy

## 2018-03-25 ENCOUNTER — Encounter: Payer: Medicare Other | Admitting: Physical Therapy

## 2018-03-26 ENCOUNTER — Other Ambulatory Visit (HOSPITAL_COMMUNITY)
Admission: RE | Admit: 2018-03-26 | Discharge: 2018-03-26 | Disposition: A | Discharge status: Home / Self Care | Payer: Medicare Other | Source: Ambulatory Visit | Attending: Orthopedic Surgery | Admitting: Orthopedic Surgery

## 2018-03-26 ENCOUNTER — Telehealth: Payer: Self-pay | Admitting: Licensed Clinical Social Worker

## 2018-03-26 DIAGNOSIS — Z96652 Presence of left artificial knee joint: Secondary | ICD-10-CM | POA: Insufficient documentation

## 2018-03-26 LAB — CBC WITH DIFFERENTIAL/PLATELET
Abs Immature Granulocytes: 0.01 10*3/uL (ref 0.00–0.07)
Basophils Absolute: 0.1 10*3/uL (ref 0.0–0.1)
Basophils Relative: 1 %
Eosinophils Absolute: 0 10*3/uL (ref 0.0–0.5)
Eosinophils Relative: 1 %
HCT: 36.5 % (ref 36.0–46.0)
Hemoglobin: 11.2 g/dL — ABNORMAL LOW (ref 12.0–15.0)
Immature Granulocytes: 0 %
Lymphocytes Relative: 39 %
Lymphs Abs: 3.1 10*3/uL (ref 0.7–4.0)
MCH: 29.8 pg (ref 26.0–34.0)
MCHC: 30.7 g/dL (ref 30.0–36.0)
MCV: 97.1 fL (ref 80.0–100.0)
Monocytes Absolute: 0.5 10*3/uL (ref 0.1–1.0)
Monocytes Relative: 6 %
NRBC: 0 % (ref 0.0–0.2)
Neutro Abs: 4.3 10*3/uL (ref 1.7–7.7)
Neutrophils Relative %: 53 %
Platelets: 312 10*3/uL (ref 150–400)
RBC: 3.76 MIL/uL — ABNORMAL LOW (ref 3.87–5.11)
RDW: 15 % (ref 11.5–15.5)
WBC: 8 10*3/uL (ref 4.0–10.5)

## 2018-03-26 LAB — C-REACTIVE PROTEIN

## 2018-03-26 LAB — SEDIMENTATION RATE: Sed Rate: 30 mm/hr — ABNORMAL HIGH (ref 0–22)

## 2018-03-26 NOTE — Telephone Encounter (Signed)
  03/26/2018 Name: Crystal Cooke MRN: [id number] DOB: 26-Aug-1950  Referred by: PCP, Dr.Feng for assistance with managing anxiety F/U call to  Ms. Crystal Cooke from [facility] consult during office visit with PCP. Patient on her way to an appointment, not a good time to talk.  Plan: LCSW will call back in 1 to 2 days  Barrera, [place]   [phone] 9:38 AM

## 2018-03-27 ENCOUNTER — Encounter: Payer: Medicare Other | Admitting: Physical Therapy

## 2018-03-29 ENCOUNTER — Ambulatory Visit: Payer: Medicare Other | Admitting: Physical Therapy

## 2018-03-29 NOTE — Telephone Encounter (Addendum)
  03/29/2018 Name: Crystal Cooke MRN: [id number] DOB: [birth year]  Referred by: PCP, Dr.Feng for assistance with managing anxiety.  2nd F/U call to [name] reference the above consult.  Patient reports "I am taking one day at a time".  She did not start taking the 25mg  of sertraline recommended by her PCP. Patient is utilizing relaxed breathing when she feels stressed or anxious.     Plan:  1. Patient will keep appointment with PCP March 4th  2. At this time patient does not plan to start taking medication  Everrett Coombe, MD has been notified of this outreach and Ms. Beryle Bagsby [facility] plan .   Casimer Lanius, [facility]   [phone] 2:01 PM

## 2018-04-01 ENCOUNTER — Encounter: Payer: Medicare Other | Admitting: Physical Therapy

## 2018-04-01 DIAGNOSIS — M25662 Stiffness of left knee, not elsewhere classified: Secondary | ICD-10-CM | POA: Diagnosis not present

## 2018-04-01 DIAGNOSIS — M6281 Muscle weakness (generalized): Secondary | ICD-10-CM | POA: Diagnosis not present

## 2018-04-01 DIAGNOSIS — M25462 Effusion, left knee: Secondary | ICD-10-CM | POA: Diagnosis not present

## 2018-04-01 DIAGNOSIS — M25562 Pain in left knee: Secondary | ICD-10-CM | POA: Diagnosis not present

## 2018-04-02 ENCOUNTER — Ambulatory Visit: Payer: Medicare Other

## 2018-04-03 ENCOUNTER — Encounter: Payer: Medicare Other | Admitting: Physical Therapy

## 2018-04-04 DIAGNOSIS — M6281 Muscle weakness (generalized): Secondary | ICD-10-CM | POA: Diagnosis not present

## 2018-04-04 DIAGNOSIS — M25462 Effusion, left knee: Secondary | ICD-10-CM | POA: Diagnosis not present

## 2018-04-04 DIAGNOSIS — M25662 Stiffness of left knee, not elsewhere classified: Secondary | ICD-10-CM | POA: Diagnosis not present

## 2018-04-04 DIAGNOSIS — M25562 Pain in left knee: Secondary | ICD-10-CM | POA: Diagnosis not present

## 2018-04-05 ENCOUNTER — Encounter: Payer: Medicare Other | Admitting: Physical Therapy

## 2018-04-08 ENCOUNTER — Ambulatory Visit: Payer: Medicare Other | Admitting: Student in an Organized Health Care Education/Training Program

## 2018-04-10 ENCOUNTER — Ambulatory Visit (INDEPENDENT_AMBULATORY_CARE_PROVIDER_SITE_OTHER): Payer: Medicare Other | Admitting: Student in an Organized Health Care Education/Training Program

## 2018-04-10 ENCOUNTER — Encounter: Payer: Self-pay | Admitting: Student in an Organized Health Care Education/Training Program

## 2018-04-10 ENCOUNTER — Other Ambulatory Visit: Payer: Self-pay

## 2018-04-10 ENCOUNTER — Ambulatory Visit: Payer: Medicare Other

## 2018-04-10 VITALS — BP 126/78 | HR 82 | Temp 98.1°F | Ht 61.0 in | Wt 126.0 lb

## 2018-04-10 DIAGNOSIS — I82402 Acute embolism and thrombosis of unspecified deep veins of left lower extremity: Secondary | ICD-10-CM | POA: Diagnosis not present

## 2018-04-10 DIAGNOSIS — J302 Other seasonal allergic rhinitis: Secondary | ICD-10-CM | POA: Diagnosis not present

## 2018-04-10 MED ORDER — FLUTICASONE PROPIONATE 50 MCG/ACT NA SUSP: 2.0000 | Freq: Every day | NASAL | 0 refills | Status: DC

## 2018-04-10 MED ORDER — CETIRIZINE HCL 5 MG PO TABS: 5.0000 mg | ORAL_TABLET | Freq: Every day | ORAL | 0 refills | Status: DC | PRN

## 2018-04-10 NOTE — Progress Notes (Signed)
   CC: follow up blood clot  HPI: Crystal Cooke is a 68 y.o. female with PMH: Past Medical History:  Diagnosis Date  . Allergy   . Anxiety   . Atrial fibrillation ([place])    h/o a fib,  NSR with last EKG, has never seen a cardiologist per pt   . DDD (degenerative disc disease), lumbar   . Hypercholesteremia   . Hypertension   . Leg DVT (deep venous thromboembolism), acute, left ([place]) 01/09/2018    History of DVT Patient presents today for follow-up of DVT.  She was started on anticoagulation for a provoked DVT after knee surgery on 12/4.  She started the medications on 12/4.  She has been taking them consistently since they were started with the exception of 1 to 2 days as seen in previous notes.  Today's sleep 3/4, indicating that she has completed a 70-month course of the medication.  She can stop taking blood thinners now.  Seasonal Allergies Patient reports persistent rhinorrhea, congestion and cough which she feels are seasonal allergies. She has not had any fevers, sore throat or ear pain. No wheezing or dyspnea. She has not been on seasonal allergy medication in the past per her report.  Review of Symptoms:  See HPI for ROS.   CC, SH/smoking status, and VS noted.  Objective: BP 126/78   Pulse 82   Temp 98.1 F (36.7 C) (Oral)   Ht 5\' 1"  (1.549 m)   Wt 126 lb (57.2 kg)   LMP 02/07/1999 (Approximate)   SpO2 98%   BMI 23.81 kg/m  GEN: NAD, alert, cooperative, and pleasant. RESPIRATORY: Comfortable work of breathing, speaks in full sentences, CTA bil CV: Regular rate noted, distal extremities well perfused, RRR GI: Soft, nondistended SKIN: warm and dry, no rashes or lesions NEURO: II-XII grossly intact MSK: Moves 4 extremities equally, Left knee warm with well-healed surgical scar, some edema, negative homan sign, mild swelling of the knee PSYCH: AAOx3, appropriate affect   Assessment and plan:  Leg DVT (deep venous thromboembolism), acute, left ([place]),  Provoked Patient has completed 3 months of anticoagulation therapy with xarelto for her provoked left LE DVT. Anticoagulation caused her a significant amount of anxiety which interfered with her functioning, as documented in pervious notes. She may discontinue therapy at this time.  Seasonal allergies - cetirizine (ZYRTEC) 5 MG tablet; Take 1 tablet (5 mg total) by mouth daily as needed for allergies.  Dispense: 30 tablet; Refill: 0 - fluticasone (FLONASE) 50 MCG/ACT nasal spray; Place 2 sprays into both nostrils daily.  Dispense: 1 g; Refill: 0    No orders of the defined types were placed in this encounter.   Meds ordered this encounter  Medications  . cetirizine (ZYRTEC) 5 MG tablet    Sig: Take 1 tablet (5 mg total) by mouth daily as needed for allergies.    Dispense:  30 tablet    Refill:  0  . fluticasone (FLONASE) 50 MCG/ACT nasal spray    Sig: Place 2 sprays into both nostrils daily.    Dispense:  1 g    Refill:  0    One bottle   Everrett Coombe, MD,MS,  PGY3 04/12/2018 10:42 PM

## 2018-04-10 NOTE — Assessment & Plan Note (Addendum)
Patient has completed 3 months of anticoagulation therapy with xarelto for her provoked left LE DVT. Anticoagulation caused her a significant amount of anxiety which interfered with her functioning, as documented in pervious notes. She may discontinue therapy at this time.

## 2018-04-10 NOTE — Patient Instructions (Signed)
It was a pleasure seeing you today in our clinic. Here is the treatment plan we have discussed and agreed upon together:  Please stop taking your blood thinner. You have completed a 3 month course.  I prescribed allergy medication for you.  Our clinic's number is [phone]. Please call with questions or concerns about what we discussed today.  Be well, Dr. Burr Medico

## 2018-04-11 DIAGNOSIS — J302 Other seasonal allergic rhinitis: Secondary | ICD-10-CM | POA: Insufficient documentation

## 2018-04-11 DIAGNOSIS — M25562 Pain in left knee: Secondary | ICD-10-CM | POA: Diagnosis not present

## 2018-04-11 DIAGNOSIS — M25662 Stiffness of left knee, not elsewhere classified: Secondary | ICD-10-CM | POA: Diagnosis not present

## 2018-04-11 DIAGNOSIS — M6281 Muscle weakness (generalized): Secondary | ICD-10-CM | POA: Diagnosis not present

## 2018-04-11 DIAGNOSIS — M25462 Effusion, left knee: Secondary | ICD-10-CM | POA: Diagnosis not present

## 2018-04-11 NOTE — Assessment & Plan Note (Signed)
-   cetirizine (ZYRTEC) 5 MG tablet; Take 1 tablet (5 mg total) by mouth daily as needed for allergies.  Dispense: 30 tablet; Refill: 0 - fluticasone (FLONASE) 50 MCG/ACT nasal spray; Place 2 sprays into both nostrils daily.  Dispense: 1 g; Refill: 0

## 2018-04-16 ENCOUNTER — Telehealth: Payer: Self-pay

## 2018-04-16 NOTE — Telephone Encounter (Signed)
Patient wants PCP to call her. She thinks she needs a [place] to check that her DVT is resolved. Does not understand why she is just to stop the medication without being checked that it is gone. She is very anxious about this.  Callback is [phone].  Danley Danker, RN [facility] RN)

## 2018-04-18 NOTE — Telephone Encounter (Signed)
This was discussed at Ms. Crystal Cooke office visit. She does not need an ultrasound. She has completed the treatment for a provoked  blood clot. If she continues to have a lot of anxiety she may benefit from another office visit.

## 2018-04-19 NOTE — Telephone Encounter (Signed)
Pt informed of below. Instructed pt that if she feels like her anxiety doesn't get better to be sure to address that with her PCP at her next visit. Patient understood and was thankful for the call. Haynes Giannotti Zimmerman Rumple, CMA

## 2018-05-01 ENCOUNTER — Telehealth: Payer: Self-pay | Admitting: Student in an Organized Health Care Education/Training Program

## 2018-05-01 NOTE — Telephone Encounter (Signed)
Due to ongoing concerns with social distancing in light of COVID-19 cases, I called this patient to offer options for their visit on 3/30. She is agreeable to a phone visit. She was placed on the telemedicine schedule for 1:30 PM on Monday 3/30.

## 2018-05-06 ENCOUNTER — Telehealth (INDEPENDENT_AMBULATORY_CARE_PROVIDER_SITE_OTHER): Payer: Medicare Other | Admitting: Student in an Organized Health Care Education/Training Program

## 2018-05-06 ENCOUNTER — Other Ambulatory Visit: Payer: Self-pay | Admitting: Student in an Organized Health Care Education/Training Program

## 2018-05-06 ENCOUNTER — Other Ambulatory Visit: Payer: Self-pay

## 2018-05-06 ENCOUNTER — Ambulatory Visit: Payer: Medicare Other | Admitting: Student in an Organized Health Care Education/Training Program

## 2018-05-06 DIAGNOSIS — I10 Essential (primary) hypertension: Secondary | ICD-10-CM

## 2018-05-06 DIAGNOSIS — F411 Generalized anxiety disorder: Secondary | ICD-10-CM

## 2018-05-06 MED ORDER — CARTIA XT 300 MG PO CP24: 300.0000 mg | ORAL_CAPSULE | Freq: Every morning | ORAL | 3 refills | Status: DC

## 2018-05-06 MED ORDER — SERTRALINE HCL 25 MG PO TABS: 25.0000 mg | ORAL_TABLET | Freq: Every day | ORAL | 0 refills | Status: DC

## 2018-05-06 NOTE — Progress Notes (Signed)
[name] Telemedicine Visit  Patient consented to have visit conducted via telephone.  Knee - anxiety [facility] in [place]  Encounter participants: Patient: Crystal Cooke  Provider: Everrett Coombe  Others (if applicable):   Chief Complaint: Anxiety  HPI: Patient reports 2 concerns.  She reports that she is very anxious about the coronavirus.  Additionally she reports that she has anxiety surrounding her knee.  We have discussed her anxiety about her knee and history of DVT in the past.  Anxiety about coronavirus For her coronavirus concerns, the patient reports that she has had 5 days of coughing, headache, sweating without fever.  She has had rhinorrhea.  She has not had any new muscle aches.  No shortness of breath.  She has been taking cold and flu medication over-the-counter.  She has been socially distancing and washing her hands.  During the interview she speaks in full sentences and is not in any respiratory distress over the phone. She wants to know why her doctor will not allow her to get a test for coronavirus.  Anxiety about her hx of DVT  patient is status post a 20-month course of Xarelto for first provoked DVT.  She continues to have limited range of motion at the knee.  She continues to have intermittent swelling at the knee.  She is not able to complete physical therapy at this time due to coronavirus and social distancing.  She reports that she has been trying to do PT exercises at home.  Generalized anxiety Patient reports that she feels anxious daily. She feels this interferes with her daily life. She has been evaluated for anxiety  In the past and prescribed zoloft but she does not recall ever taking this medication. She is open to medical therapy for anxiety at this time.  ROS: No N/V/D/C, no chest pain, no congestion  Pertinent PMHx:  Anxiety First provoked DVT HTN HLD  Assessment/Plan: 1. Anxiety about coronavirus Explained limited  testing availability Strict call/ED precautions were discussed including dyspnea Continue social distancing and hand washing measures  2. Anxiety about hx of DVT Encouraged continued PT to improve ROM at the knee  3. Generalized Anxiety - sertraline (ZOLOFT) 25 MG tablet; Take 1 tablet (25 mg total) by mouth daily.  Dispense: 30 tablet; Refill: 0 - scheduled a follow up telemedicine visit for 2 weeks from now, would consider titrating to 50 mg at that time if appropriate  Time spent on phone with patient: 20 minutes

## 2018-05-20 ENCOUNTER — Telehealth: Payer: Self-pay | Admitting: Family Medicine

## 2018-05-20 ENCOUNTER — Other Ambulatory Visit: Payer: Self-pay

## 2018-05-20 ENCOUNTER — Telehealth: Payer: Medicare Other

## 2018-05-20 NOTE — Telephone Encounter (Signed)
Patient is scheduled for telemedicine appointment today at 1:30.  Called patient regarding this appointment, patient did not answer.  Left voicemail to return call.

## 2018-06-06 ENCOUNTER — Telehealth: Payer: Self-pay | Admitting: Family Medicine

## 2018-06-06 NOTE — Telephone Encounter (Signed)
Patient feels overall well with no fevers or shortness of breath.  She has noticed some decreased taste and would like the coronavirus testing.  Discussed that we are not offering widespread testing at this time.  She voiced good understanding.  We discussed self-isolation and quarantine measures as well as red flag symptoms.

## 2018-06-13 DIAGNOSIS — Z96652 Presence of left artificial knee joint: Secondary | ICD-10-CM | POA: Diagnosis not present

## 2018-06-13 DIAGNOSIS — R2242 Localized swelling, mass and lump, left lower limb: Secondary | ICD-10-CM | POA: Diagnosis not present

## 2018-06-14 ENCOUNTER — Other Ambulatory Visit (HOSPITAL_COMMUNITY): Payer: Self-pay | Admitting: Orthopedic Surgery

## 2018-06-14 ENCOUNTER — Other Ambulatory Visit: Payer: Self-pay | Admitting: Orthopedic Surgery

## 2018-06-14 DIAGNOSIS — R2242 Localized swelling, mass and lump, left lower limb: Secondary | ICD-10-CM

## 2018-06-18 ENCOUNTER — Ambulatory Visit (HOSPITAL_COMMUNITY)
Admission: RE | Admit: 2018-06-18 | Discharge: 2018-06-18 | Disposition: A | Discharge status: Home / Self Care | Payer: Medicare Other | Source: Ambulatory Visit | Attending: Orthopedic Surgery | Admitting: Orthopedic Surgery

## 2018-06-18 ENCOUNTER — Other Ambulatory Visit: Payer: Self-pay

## 2018-06-18 DIAGNOSIS — R2242 Localized swelling, mass and lump, left lower limb: Secondary | ICD-10-CM | POA: Insufficient documentation

## 2018-06-18 DIAGNOSIS — Z86718 Personal history of other venous thrombosis and embolism: Secondary | ICD-10-CM | POA: Diagnosis not present

## 2018-07-12 ENCOUNTER — Ambulatory Visit: Payer: Medicare Other | Admitting: Student in an Organized Health Care Education/Training Program

## 2018-07-19 ENCOUNTER — Ambulatory Visit: Payer: Medicare Other | Admitting: Student in an Organized Health Care Education/Training Program

## 2018-07-31 ENCOUNTER — Ambulatory Visit: Payer: Medicare Other | Admitting: Student in an Organized Health Care Education/Training Program

## 2018-08-19 DIAGNOSIS — H04123 Dry eye syndrome of bilateral lacrimal glands: Secondary | ICD-10-CM | POA: Diagnosis not present

## 2018-08-19 DIAGNOSIS — H1033 Unspecified acute conjunctivitis, bilateral: Secondary | ICD-10-CM | POA: Diagnosis not present

## 2018-08-19 DIAGNOSIS — H1045 Other chronic allergic conjunctivitis: Secondary | ICD-10-CM | POA: Diagnosis not present

## 2018-08-21 DIAGNOSIS — R7301 Impaired fasting glucose: Secondary | ICD-10-CM | POA: Diagnosis not present

## 2018-08-21 DIAGNOSIS — E782 Mixed hyperlipidemia: Secondary | ICD-10-CM | POA: Diagnosis not present

## 2018-08-21 DIAGNOSIS — R631 Polydipsia: Secondary | ICD-10-CM | POA: Diagnosis not present

## 2018-08-21 DIAGNOSIS — Z0189 Encounter for other specified special examinations: Secondary | ICD-10-CM | POA: Diagnosis not present

## 2018-08-21 DIAGNOSIS — I10 Essential (primary) hypertension: Secondary | ICD-10-CM | POA: Diagnosis not present

## 2018-08-21 DIAGNOSIS — R42 Dizziness and giddiness: Secondary | ICD-10-CM | POA: Diagnosis not present

## 2018-09-25 DIAGNOSIS — Z0001 Encounter for general adult medical examination with abnormal findings: Secondary | ICD-10-CM | POA: Diagnosis not present

## 2018-09-25 DIAGNOSIS — R42 Dizziness and giddiness: Secondary | ICD-10-CM | POA: Diagnosis not present

## 2018-09-25 DIAGNOSIS — I10 Essential (primary) hypertension: Secondary | ICD-10-CM | POA: Diagnosis not present

## 2018-09-25 DIAGNOSIS — R945 Abnormal results of liver function studies: Secondary | ICD-10-CM | POA: Diagnosis not present

## 2018-09-25 DIAGNOSIS — M25561 Pain in right knee: Secondary | ICD-10-CM | POA: Diagnosis not present

## 2018-09-25 DIAGNOSIS — E782 Mixed hyperlipidemia: Secondary | ICD-10-CM | POA: Diagnosis not present

## 2018-09-25 DIAGNOSIS — R7303 Prediabetes: Secondary | ICD-10-CM | POA: Diagnosis not present

## 2018-10-13 IMAGING — DX DG LUMBAR SPINE COMPLETE 4+V
5 series · 5 of 5 positions shown · non-contrast
Comparison: 06/06/2016

CLINICAL DATA: Low back pain radiating down left leg.

EXAM:
LUMBAR SPINE - COMPLETE 4+ VIEW

[l-spine ap]
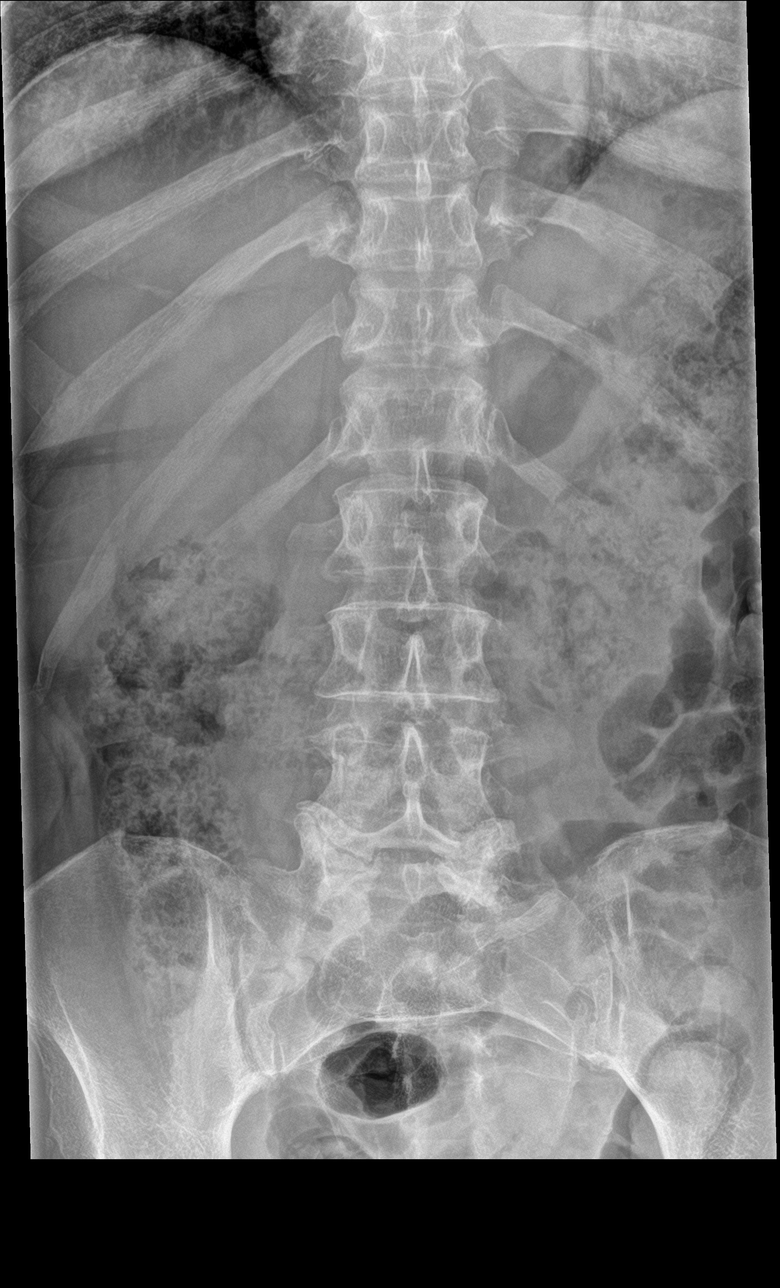

[l-spine obl (1 of 2)]
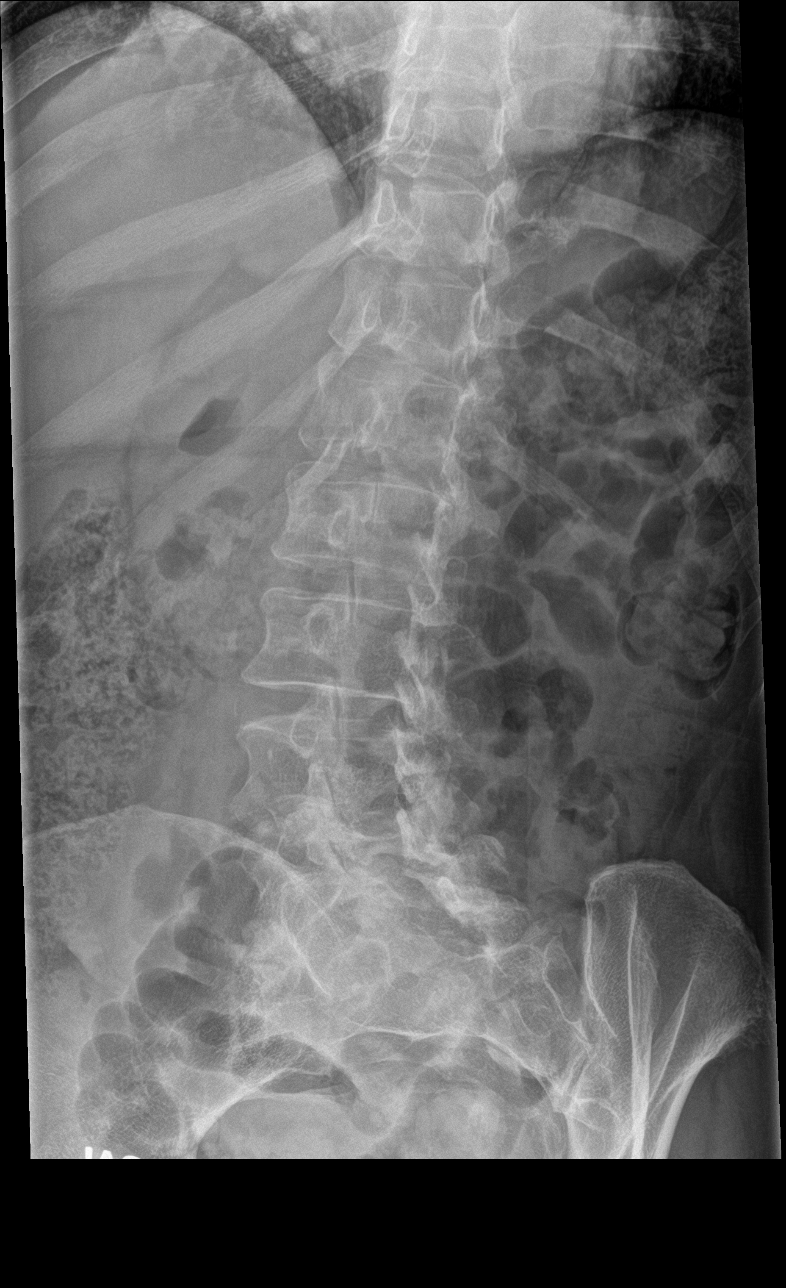

[l-spine obl (2 of 2)]
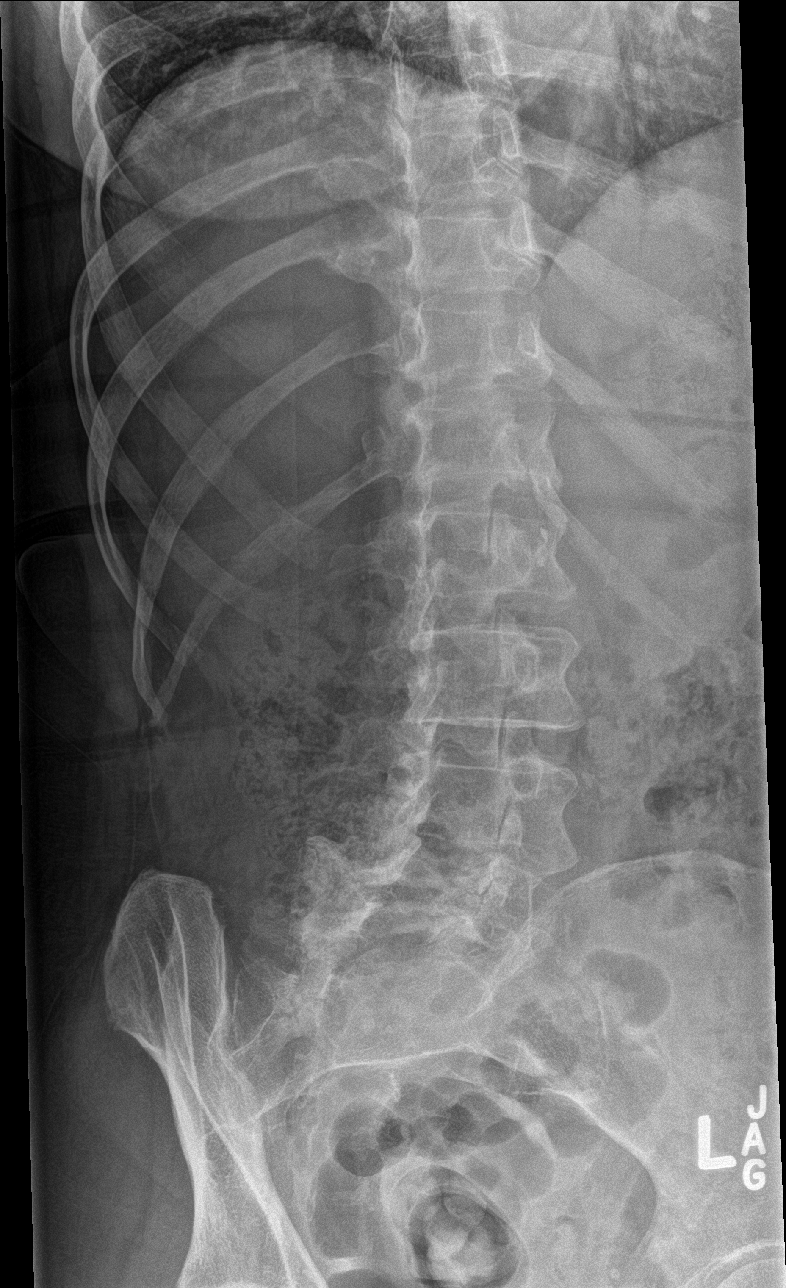

[l-spine lat]
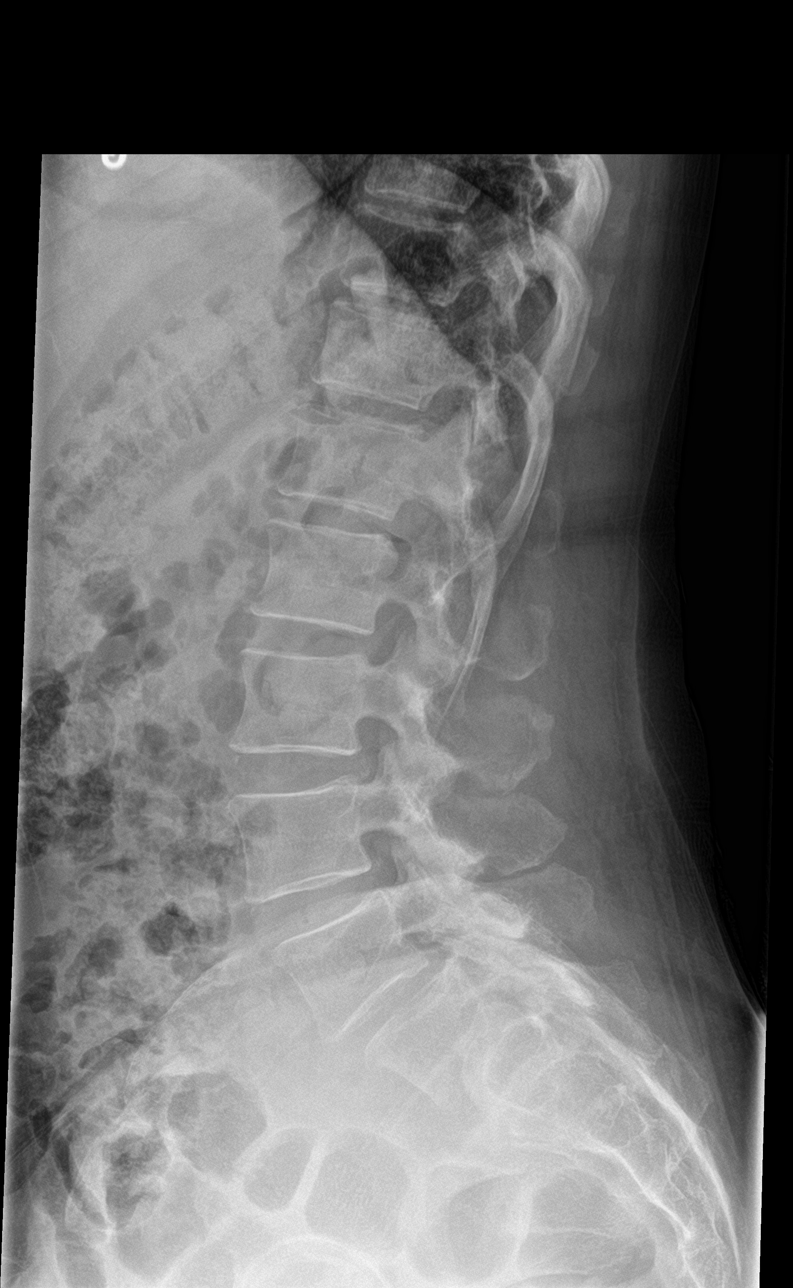

[l-spine spot]
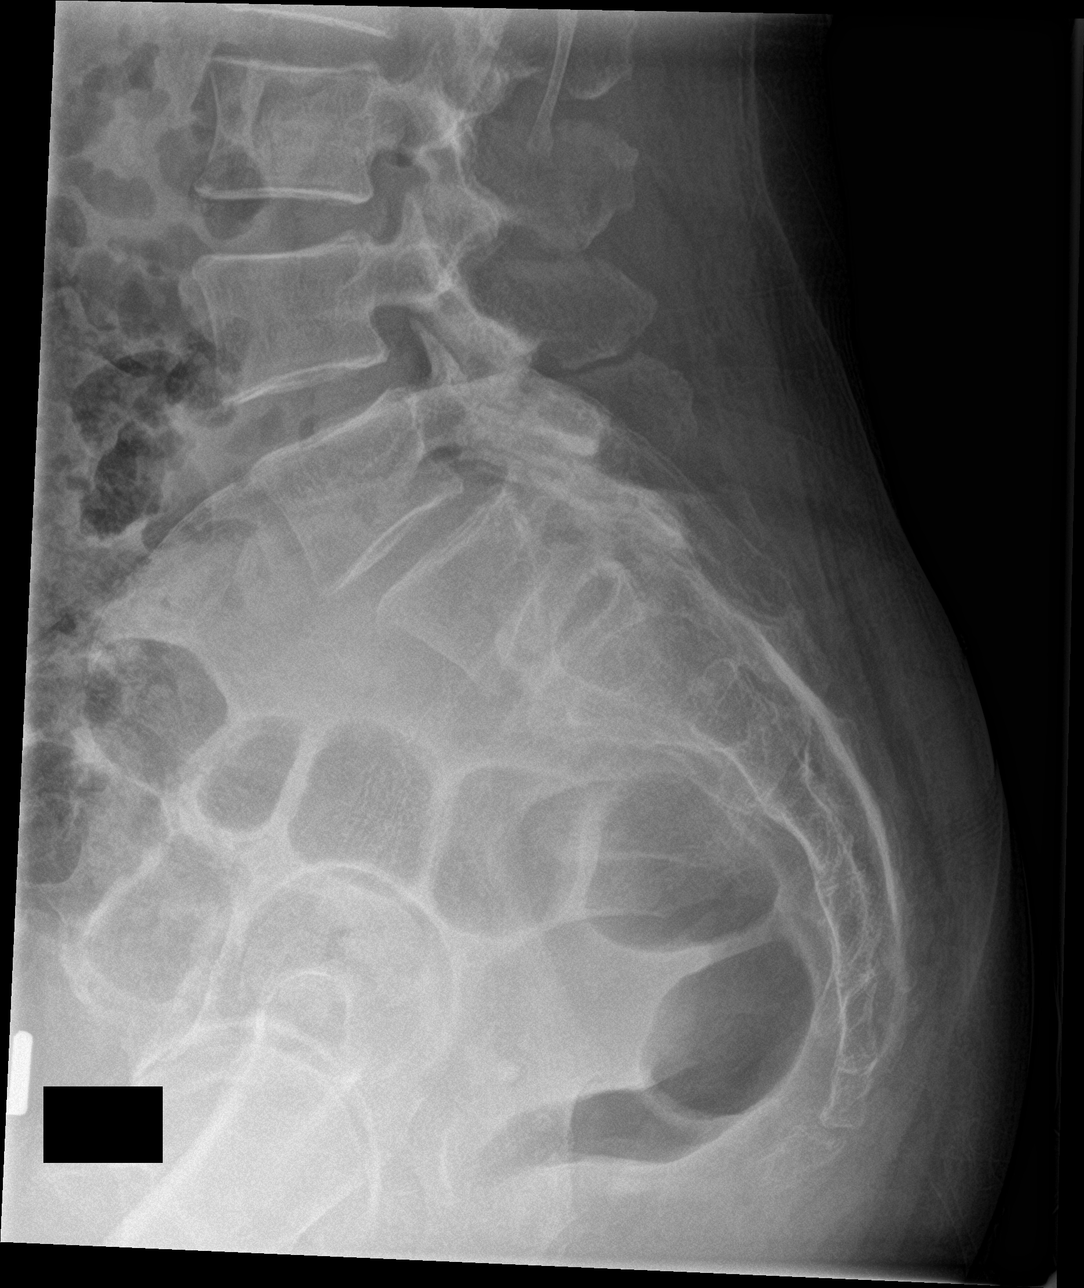

[5 of 5 positions shown; findings below may reference images not displayed]

FINDINGS: Transitional anatomy with partial sacralization of L5. There is 5 mm
of anterolisthesis of L4 on L5 and L5 on S1. Disc space narrowing at
L4-5 and L5-S1. No acute findings. S by joints are symmetric and
unremarkable.
IMPRESSION: Degenerative disc disease and grade 1 anterolisthesis in the lower
lumbar spine as above. No acute findings.

## 2018-10-16 DIAGNOSIS — M25561 Pain in right knee: Secondary | ICD-10-CM | POA: Diagnosis not present

## 2018-10-16 DIAGNOSIS — M25562 Pain in left knee: Secondary | ICD-10-CM | POA: Diagnosis not present

## 2018-10-23 DIAGNOSIS — M25562 Pain in left knee: Secondary | ICD-10-CM | POA: Diagnosis not present

## 2018-10-25 DIAGNOSIS — M25562 Pain in left knee: Secondary | ICD-10-CM | POA: Diagnosis not present

## 2018-11-05 DIAGNOSIS — K5900 Constipation, unspecified: Secondary | ICD-10-CM | POA: Diagnosis not present

## 2018-11-05 DIAGNOSIS — K7689 Other specified diseases of liver: Secondary | ICD-10-CM | POA: Diagnosis not present

## 2018-11-05 DIAGNOSIS — Z23 Encounter for immunization: Secondary | ICD-10-CM | POA: Diagnosis not present

## 2018-11-05 DIAGNOSIS — K862 Cyst of pancreas: Secondary | ICD-10-CM | POA: Diagnosis not present

## 2018-12-09 DIAGNOSIS — M25562 Pain in left knee: Secondary | ICD-10-CM | POA: Diagnosis not present

## 2018-12-13 NOTE — H&P (Signed)
[facility] 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  Status post left total knee arthroplasty by Dr. Harlow Mares in [place] in October of 2019.   History of present illness: She had left total knee arthroplasty in October of [id number], complicated by DVT which was treated.  Her postoperative physical therapy was reduced due to same and COVID.  She has had stiffness and inability to flex her knee since.  She has intermittent swelling.  Never had any fever or systemic symptoms and no incisional issues per her report.  She tried therapy on her own and recently formal physical therapy which she did not tolerate.  This still limits her ADLs and quality of life.   Past medical history:  Significant for hypertension.  Denies DVT prior to her most recent postoperative DVT.  No history of MI or CVA.   Past surgical history: Splenectomy and partial pancreatectomy, followed by Dr. Joneen Caraway at [facility].  Social history: She is a nonsmoker.   EXAMINATION: Well appearing and in no apparent distress.  Ambulates with a mildly antalgic gait.  Range of motion of the left knee 2-95 degrees.  Neurovascularly intact.  Incision fully healed.  Minimal swelling.  No sign of infection.    X-RAYS: None today.  Previous x-rays show stable left total knee arthroplasty without sign of loosening or acute osseous abnormalities.    ASSESSMENT/PLAN: Arthrofibrosis.  Status post left total knee replacement performed by Dr. Harlow Mares in October of 2019.  She has failed conservative measures, home therapy and formal physical therapy.  Details, risks and benefits of closed manipulation discussed at length, including damage to components, bone  and associated structures.  We will plan for this and Norco/Prednisone taper postoperatively, along with immediate postoperative physical therapy.    Crystal Amble.  Percell Cooke, M.D.  Dictated by: Crystal Lofty, Crystal Cooke Electronically verified by Crystal Cooke, M.D. TDM(HCM):jjh Cc: Dr. Allyn Kenner, fax: [phone] D 12-11-18 T 12-12-18

## 2018-12-19 DIAGNOSIS — H43399 Other vitreous opacities, unspecified eye: Secondary | ICD-10-CM | POA: Diagnosis not present

## 2018-12-19 DIAGNOSIS — R2242 Localized swelling, mass and lump, left lower limb: Secondary | ICD-10-CM | POA: Diagnosis not present

## 2018-12-19 DIAGNOSIS — Z86718 Personal history of other venous thrombosis and embolism: Secondary | ICD-10-CM | POA: Diagnosis not present

## 2018-12-19 DIAGNOSIS — R600 Localized edema: Secondary | ICD-10-CM | POA: Diagnosis not present

## 2018-12-19 DIAGNOSIS — M79605 Pain in left leg: Secondary | ICD-10-CM | POA: Diagnosis not present

## 2018-12-19 DIAGNOSIS — H6690 Otitis media, unspecified, unspecified ear: Secondary | ICD-10-CM | POA: Diagnosis not present

## 2018-12-24 ENCOUNTER — Ambulatory Visit: Payer: Medicare Other | Admitting: Physical Therapy

## 2018-12-25 ENCOUNTER — Ambulatory Visit: Payer: Medicare Other | Admitting: Physical Therapy

## 2019-01-01 DIAGNOSIS — H25813 Combined forms of age-related cataract, bilateral: Secondary | ICD-10-CM | POA: Diagnosis not present

## 2019-01-01 DIAGNOSIS — H60311 Diffuse otitis externa, right ear: Secondary | ICD-10-CM | POA: Diagnosis not present

## 2019-01-01 DIAGNOSIS — I10 Essential (primary) hypertension: Secondary | ICD-10-CM | POA: Diagnosis not present

## 2019-01-01 DIAGNOSIS — E782 Mixed hyperlipidemia: Secondary | ICD-10-CM | POA: Diagnosis not present

## 2019-01-07 ENCOUNTER — Ambulatory Visit: Payer: Medicare Other | Admitting: Physical Therapy

## 2019-01-14 ENCOUNTER — Ambulatory Visit (HOSPITAL_BASED_OUTPATIENT_CLINIC_OR_DEPARTMENT_OTHER): Admit: 2019-01-14 | Payer: Medicare Other | Admitting: Orthopedic Surgery

## 2019-01-14 ENCOUNTER — Encounter (HOSPITAL_BASED_OUTPATIENT_CLINIC_OR_DEPARTMENT_OTHER): Payer: Self-pay

## 2019-01-14 SURGERY — MANIPULATION, KNEE, CLOSED
Anesthesia: Choice | Site: Knee | Laterality: Left

## 2019-01-15 ENCOUNTER — Other Ambulatory Visit: Payer: Self-pay

## 2019-01-15 DIAGNOSIS — Z20828 Contact with and (suspected) exposure to other viral communicable diseases: Secondary | ICD-10-CM | POA: Diagnosis not present

## 2019-01-15 DIAGNOSIS — Z712 Person consulting for explanation of examination or test findings: Secondary | ICD-10-CM | POA: Diagnosis not present

## 2019-01-15 DIAGNOSIS — Z20822 Contact with and (suspected) exposure to covid-19: Secondary | ICD-10-CM

## 2019-01-16 LAB — NOVEL CORONAVIRUS, NAA: SARS-CoV-2, NAA: NOT DETECTED

## 2019-01-17 ENCOUNTER — Telehealth: Payer: Self-pay

## 2019-01-17 NOTE — Telephone Encounter (Signed)
Caller given negative result and verbalized understanding  

## 2019-03-10 DIAGNOSIS — R82998 Other abnormal findings in urine: Secondary | ICD-10-CM | POA: Diagnosis not present

## 2019-03-10 DIAGNOSIS — N2889 Other specified disorders of kidney and ureter: Secondary | ICD-10-CM | POA: Diagnosis not present

## 2019-03-10 DIAGNOSIS — N281 Cyst of kidney, acquired: Secondary | ICD-10-CM | POA: Diagnosis not present

## 2019-03-10 DIAGNOSIS — R3129 Other microscopic hematuria: Secondary | ICD-10-CM | POA: Diagnosis not present

## 2019-03-26 DIAGNOSIS — N390 Urinary tract infection, site not specified: Secondary | ICD-10-CM | POA: Diagnosis not present

## 2019-03-26 DIAGNOSIS — H9209 Otalgia, unspecified ear: Secondary | ICD-10-CM | POA: Diagnosis not present

## 2019-03-31 DIAGNOSIS — M25562 Pain in left knee: Secondary | ICD-10-CM | POA: Diagnosis not present

## 2019-03-31 DIAGNOSIS — M25561 Pain in right knee: Secondary | ICD-10-CM | POA: Diagnosis not present

## 2019-04-07 ENCOUNTER — Ambulatory Visit: Payer: Medicare Other | Attending: Internal Medicine

## 2019-04-07 DIAGNOSIS — Z23 Encounter for immunization: Secondary | ICD-10-CM | POA: Insufficient documentation

## 2019-04-07 NOTE — Progress Notes (Signed)
   Covid-19 Vaccination Clinic  Name:  Crystal Cooke    MRN: [id number] DOB: December 31, 1950  04/07/2019  [name] was observed post Covid-19 immunization for 15 minutes without incidence. She was provided with Vaccine Information Sheet and instruction to access the V-Safe system.   [name] was instructed to call 911 with any severe reactions post vaccine: Marland Kitchen Difficulty breathing  . Swelling of your face and throat  . A fast heartbeat  . A bad rash all over your body  . Dizziness and weakness    Immunizations Administered    Name Date Dose VIS Date Route   Pfizer COVID-19 Vaccine 04/07/2019 11:15 AM 0.3 mL 01/17/2019 Intramuscular   Manufacturer: [place]   Lot: [id number]   [place]: [id number]

## 2019-05-06 ENCOUNTER — Ambulatory Visit: Payer: Medicare Other | Attending: Internal Medicine

## 2019-05-06 DIAGNOSIS — R21 Rash and other nonspecific skin eruption: Secondary | ICD-10-CM | POA: Diagnosis not present

## 2019-05-06 DIAGNOSIS — E7800 Pure hypercholesterolemia, unspecified: Secondary | ICD-10-CM | POA: Diagnosis not present

## 2019-05-06 DIAGNOSIS — I10 Essential (primary) hypertension: Secondary | ICD-10-CM | POA: Diagnosis not present

## 2019-05-06 DIAGNOSIS — Z888 Allergy status to other drugs, medicaments and biological substances status: Secondary | ICD-10-CM | POA: Diagnosis not present

## 2019-05-06 DIAGNOSIS — Z79899 Other long term (current) drug therapy: Secondary | ICD-10-CM | POA: Diagnosis not present

## 2019-05-06 DIAGNOSIS — Z23 Encounter for immunization: Secondary | ICD-10-CM

## 2019-05-06 DIAGNOSIS — Z881 Allergy status to other antibiotic agents status: Secondary | ICD-10-CM | POA: Diagnosis not present

## 2019-05-06 NOTE — Progress Notes (Signed)
   Covid-19 Vaccination Clinic  Name:  Crystal Cooke    MRN: [id number] DOB: Sep 04, 1950  05/06/2019  [name] was observed post Covid-19 immunization for 15 minutes without incident. She was provided with Vaccine Information Sheet and instruction to access the V-Safe system.   [name] was instructed to call 911 with any severe reactions post vaccine: Marland Kitchen Difficulty breathing  . Swelling of face and throat  . A fast heartbeat  . A bad rash all over body  . Dizziness and weakness   Immunizations Administered    Name Date Dose VIS Date Route   Pfizer COVID-19 Vaccine 05/06/2019 10:55 AM 0.3 mL 01/17/2019 Intramuscular   Manufacturer: [place]   Lot: [id number]   [place]: [id number]

## 2019-05-09 ENCOUNTER — Other Ambulatory Visit: Payer: Self-pay | Admitting: Student in an Organized Health Care Education/Training Program

## 2019-05-09 DIAGNOSIS — I10 Essential (primary) hypertension: Secondary | ICD-10-CM

## 2019-05-12 DIAGNOSIS — R42 Dizziness and giddiness: Secondary | ICD-10-CM | POA: Diagnosis not present

## 2019-05-12 DIAGNOSIS — Z0001 Encounter for general adult medical examination with abnormal findings: Secondary | ICD-10-CM | POA: Diagnosis not present

## 2019-05-12 DIAGNOSIS — I10 Essential (primary) hypertension: Secondary | ICD-10-CM | POA: Diagnosis not present

## 2019-05-12 DIAGNOSIS — H43399 Other vitreous opacities, unspecified eye: Secondary | ICD-10-CM | POA: Diagnosis not present

## 2019-05-12 DIAGNOSIS — R7301 Impaired fasting glucose: Secondary | ICD-10-CM | POA: Diagnosis not present

## 2019-05-12 DIAGNOSIS — Z712 Person consulting for explanation of examination or test findings: Secondary | ICD-10-CM | POA: Diagnosis not present

## 2019-05-12 DIAGNOSIS — E782 Mixed hyperlipidemia: Secondary | ICD-10-CM | POA: Diagnosis not present

## 2019-05-12 DIAGNOSIS — H6690 Otitis media, unspecified, unspecified ear: Secondary | ICD-10-CM | POA: Diagnosis not present

## 2019-05-12 DIAGNOSIS — Z0189 Encounter for other specified special examinations: Secondary | ICD-10-CM | POA: Diagnosis not present

## 2019-05-12 DIAGNOSIS — R631 Polydipsia: Secondary | ICD-10-CM | POA: Diagnosis not present

## 2019-05-12 DIAGNOSIS — R21 Rash and other nonspecific skin eruption: Secondary | ICD-10-CM | POA: Diagnosis not present

## 2019-05-27 DIAGNOSIS — H9203 Otalgia, bilateral: Secondary | ICD-10-CM | POA: Diagnosis not present

## 2019-05-27 DIAGNOSIS — Z96652 Presence of left artificial knee joint: Secondary | ICD-10-CM | POA: Diagnosis not present

## 2019-05-27 DIAGNOSIS — R945 Abnormal results of liver function studies: Secondary | ICD-10-CM | POA: Diagnosis not present

## 2019-05-27 DIAGNOSIS — R944 Abnormal results of kidney function studies: Secondary | ICD-10-CM | POA: Diagnosis not present

## 2019-05-27 DIAGNOSIS — R7303 Prediabetes: Secondary | ICD-10-CM | POA: Diagnosis not present

## 2019-05-27 DIAGNOSIS — J309 Allergic rhinitis, unspecified: Secondary | ICD-10-CM | POA: Diagnosis not present

## 2019-05-27 DIAGNOSIS — I10 Essential (primary) hypertension: Secondary | ICD-10-CM | POA: Diagnosis not present

## 2019-06-06 DIAGNOSIS — L299 Pruritus, unspecified: Secondary | ICD-10-CM | POA: Diagnosis not present

## 2019-06-06 DIAGNOSIS — H9201 Otalgia, right ear: Secondary | ICD-10-CM | POA: Diagnosis not present

## 2019-09-25 DIAGNOSIS — R631 Polydipsia: Secondary | ICD-10-CM | POA: Diagnosis not present

## 2019-09-25 DIAGNOSIS — R944 Abnormal results of kidney function studies: Secondary | ICD-10-CM | POA: Diagnosis not present

## 2019-09-25 DIAGNOSIS — Z0189 Encounter for other specified special examinations: Secondary | ICD-10-CM | POA: Diagnosis not present

## 2019-09-25 DIAGNOSIS — H9203 Otalgia, bilateral: Secondary | ICD-10-CM | POA: Diagnosis not present

## 2019-09-25 DIAGNOSIS — R945 Abnormal results of liver function studies: Secondary | ICD-10-CM | POA: Diagnosis not present

## 2019-09-25 DIAGNOSIS — R42 Dizziness and giddiness: Secondary | ICD-10-CM | POA: Diagnosis not present

## 2019-09-25 DIAGNOSIS — I10 Essential (primary) hypertension: Secondary | ICD-10-CM | POA: Diagnosis not present

## 2019-09-25 DIAGNOSIS — J309 Allergic rhinitis, unspecified: Secondary | ICD-10-CM | POA: Diagnosis not present

## 2019-09-25 DIAGNOSIS — E782 Mixed hyperlipidemia: Secondary | ICD-10-CM | POA: Diagnosis not present

## 2019-09-25 DIAGNOSIS — M545 Low back pain: Secondary | ICD-10-CM | POA: Diagnosis not present

## 2019-09-25 DIAGNOSIS — R319 Hematuria, unspecified: Secondary | ICD-10-CM | POA: Diagnosis not present

## 2019-09-25 DIAGNOSIS — Z96652 Presence of left artificial knee joint: Secondary | ICD-10-CM | POA: Diagnosis not present

## 2019-10-03 DIAGNOSIS — M25562 Pain in left knee: Secondary | ICD-10-CM | POA: Diagnosis not present

## 2019-10-03 DIAGNOSIS — M25561 Pain in right knee: Secondary | ICD-10-CM | POA: Diagnosis not present

## 2019-10-10 DIAGNOSIS — Z23 Encounter for immunization: Secondary | ICD-10-CM | POA: Diagnosis not present

## 2019-10-16 DIAGNOSIS — R509 Fever, unspecified: Secondary | ICD-10-CM | POA: Diagnosis not present

## 2019-10-16 DIAGNOSIS — K862 Cyst of pancreas: Secondary | ICD-10-CM | POA: Diagnosis not present

## 2019-10-16 DIAGNOSIS — N281 Cyst of kidney, acquired: Secondary | ICD-10-CM | POA: Diagnosis not present

## 2019-10-16 DIAGNOSIS — I2699 Other pulmonary embolism without acute cor pulmonale: Secondary | ICD-10-CM | POA: Diagnosis not present

## 2019-10-16 DIAGNOSIS — R600 Localized edema: Secondary | ICD-10-CM | POA: Diagnosis not present

## 2019-10-16 DIAGNOSIS — Z8679 Personal history of other diseases of the circulatory system: Secondary | ICD-10-CM | POA: Diagnosis not present

## 2019-10-16 DIAGNOSIS — N2889 Other specified disorders of kidney and ureter: Secondary | ICD-10-CM | POA: Diagnosis not present

## 2019-10-16 DIAGNOSIS — I480 Paroxysmal atrial fibrillation: Secondary | ICD-10-CM | POA: Diagnosis not present

## 2019-10-16 DIAGNOSIS — N289 Disorder of kidney and ureter, unspecified: Secondary | ICD-10-CM | POA: Diagnosis not present

## 2019-10-16 DIAGNOSIS — M7989 Other specified soft tissue disorders: Secondary | ICD-10-CM | POA: Diagnosis not present

## 2019-10-16 DIAGNOSIS — R0602 Shortness of breath: Secondary | ICD-10-CM | POA: Diagnosis not present

## 2019-10-17 DIAGNOSIS — I2699 Other pulmonary embolism without acute cor pulmonale: Secondary | ICD-10-CM | POA: Diagnosis not present

## 2019-10-18 DIAGNOSIS — R0602 Shortness of breath: Secondary | ICD-10-CM | POA: Diagnosis not present

## 2019-10-20 DIAGNOSIS — Z20828 Contact with and (suspected) exposure to other viral communicable diseases: Secondary | ICD-10-CM | POA: Diagnosis not present

## 2019-11-11 DIAGNOSIS — R944 Abnormal results of kidney function studies: Secondary | ICD-10-CM | POA: Diagnosis not present

## 2019-11-11 DIAGNOSIS — Z96652 Presence of left artificial knee joint: Secondary | ICD-10-CM | POA: Diagnosis not present

## 2019-11-11 DIAGNOSIS — I2694 Multiple subsegmental pulmonary emboli without acute cor pulmonale: Secondary | ICD-10-CM | POA: Diagnosis not present

## 2019-11-11 DIAGNOSIS — E782 Mixed hyperlipidemia: Secondary | ICD-10-CM | POA: Diagnosis not present

## 2019-11-11 DIAGNOSIS — J309 Allergic rhinitis, unspecified: Secondary | ICD-10-CM | POA: Diagnosis not present

## 2019-11-11 DIAGNOSIS — H9203 Otalgia, bilateral: Secondary | ICD-10-CM | POA: Diagnosis not present

## 2019-11-11 DIAGNOSIS — R7303 Prediabetes: Secondary | ICD-10-CM | POA: Diagnosis not present

## 2019-11-11 DIAGNOSIS — I10 Essential (primary) hypertension: Secondary | ICD-10-CM | POA: Diagnosis not present

## 2019-11-14 ENCOUNTER — Other Ambulatory Visit (HOSPITAL_COMMUNITY): Payer: Self-pay | Admitting: Internal Medicine

## 2019-11-14 DIAGNOSIS — Z1231 Encounter for screening mammogram for malignant neoplasm of breast: Secondary | ICD-10-CM

## 2019-11-24 ENCOUNTER — Encounter: Payer: Self-pay | Admitting: Internal Medicine

## 2019-11-25 DIAGNOSIS — M545 Low back pain, unspecified: Secondary | ICD-10-CM | POA: Diagnosis not present

## 2019-11-25 DIAGNOSIS — I2694 Multiple subsegmental pulmonary emboli without acute cor pulmonale: Secondary | ICD-10-CM | POA: Diagnosis not present

## 2019-11-25 DIAGNOSIS — I10 Essential (primary) hypertension: Secondary | ICD-10-CM | POA: Diagnosis not present

## 2019-11-25 DIAGNOSIS — R944 Abnormal results of kidney function studies: Secondary | ICD-10-CM | POA: Diagnosis not present

## 2019-11-26 ENCOUNTER — Ambulatory Visit (HOSPITAL_COMMUNITY)
Admission: RE | Admit: 2019-11-26 | Discharge: 2019-11-26 | Disposition: A | Discharge status: Home / Self Care | Payer: Medicare Other | Source: Ambulatory Visit | Attending: Internal Medicine | Admitting: Internal Medicine

## 2019-11-26 ENCOUNTER — Other Ambulatory Visit: Payer: Self-pay

## 2019-11-26 DIAGNOSIS — Z1231 Encounter for screening mammogram for malignant neoplasm of breast: Secondary | ICD-10-CM | POA: Insufficient documentation

## 2019-11-28 DIAGNOSIS — Z20822 Contact with and (suspected) exposure to covid-19: Secondary | ICD-10-CM | POA: Diagnosis not present

## 2019-12-12 ENCOUNTER — Encounter: Payer: Self-pay | Admitting: Internal Medicine

## 2019-12-12 ENCOUNTER — Ambulatory Visit (INDEPENDENT_AMBULATORY_CARE_PROVIDER_SITE_OTHER): Payer: Medicare Other | Admitting: Internal Medicine

## 2019-12-12 ENCOUNTER — Other Ambulatory Visit: Payer: Self-pay

## 2019-12-12 VITALS — BP 134/79 | HR 90 | Temp 97.8°F | Ht 61.0 in | Wt 126.0 lb

## 2019-12-12 DIAGNOSIS — Z800 Family history of malignant neoplasm of digestive organs: Secondary | ICD-10-CM | POA: Diagnosis not present

## 2019-12-12 DIAGNOSIS — K5909 Other constipation: Secondary | ICD-10-CM | POA: Diagnosis not present

## 2019-12-12 NOTE — Patient Instructions (Signed)
Begin Linzess 72 - one capsule daily - samples x 2 weeks.  Please call us in 2 weeks and let us know how you are doing.  We need to hold of on your colonoscopy until next year  Office visit with [place] in 3 months

## 2019-12-12 NOTE — Progress Notes (Signed)
Primary Care Physician:  Celene Squibb, MD Primary Gastroenterologist:  Dr. Gala Romney  Pre-Procedure History & Physical: HPI:  Crystal Cooke is a 69 y.o. female here for consideration of high risk screening colonoscopy.  Last colonoscopy was 2016 by Dr. Britta Mccreedy in [place].  Reported to be normal.  She was referred by Dr. Nevada Crane for consideration of a screening exam at this time.  Family history is significant that her sister was diagnosed with colon cancer late 10s and succumbed to the illness in her early 45s.  Aside from chronic constipation for which she takes prune juice daily,  she does not really have any lower GI tract symptoms.  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 Birdie Sons at [facility].  She had a partial pancreatectomy and splenectomy.  Clinically, doing well.  I do not have specifics of that episode for review including op notes and path.  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  On the last CT she was found to have a pulmonary embolus for which further evaluation revealed bilateral pulmonary embolus  -    last month.  She is now on Eliquis.  She has a prior history of a left lower extremity DVT after knee replacement.  Past Medical History:  Diagnosis Date  . Allergy   . Anxiety   . Atrial fibrillation ([place])    h/o a fib,  NSR with last EKG, has never seen a cardiologist per pt   . DDD (degenerative disc disease), lumbar   . Hypercholesteremia   . Hypertension   . Leg DVT (deep venous thromboembolism), acute, left ([place]) 01/09/2018    Past Surgical History:  Procedure Laterality Date  . COLONOSCOPY  05/05/14   Dr.Benson- normal colonoscopy, retroflexed views revealed no abnormalities.   . ESOPHAGOGASTRODUODENOSCOPY  05/05/14   Dr.Benson- normal EGD, retroflexed views revealed no abnormalities   . EUS N/A 09/24/2014   Procedure: UPPER ENDOSCOPIC  ULTRASOUND (EUS) RADIAL;  Surgeon: Milus Banister, MD;  Location: [facility];  Service: Endoscopy;  Laterality: N/A;  . KNEE ARTHROSCOPY Right 01/05/2017   Procedure: RIGHT KNEE ARTHROSCOPY, PARTIAL LATERAL MENISCECTOMY,;  Surgeon: Marybelle Killings, MD;  Location: [place];  Service: Orthopedics;  Laterality: Right;  . none    . PANCREAS SURGERY     partial    Prior to Admission medications   Medication Sig Start Date End Date Taking? Authorizing Provider  acetaminophen (TYLENOL) 500 MG tablet Take 1,000 mg by mouth every 6 (six) hours as needed for mild pain.   Yes [provider]  CARTIA XT 300 MG 24 hr capsule Take 1 capsule by mouth once daily in the morning 05/12/19  Yes [name], MD  cetirizine (ZYRTEC) 5 MG tablet Take 1 tablet (5 mg total) by mouth daily as needed for allergies. 04/10/18  Yes Everrett Coombe, MD  ELIQUIS 5 MG TABS tablet Take 5 mg by mouth 2 (two) times daily. 11/11/19  Yes [provider]  fluticasone (FLONASE) 50 MCG/ACT nasal spray Place 2 sprays into both nostrils daily. 04/10/18 12/12/19 Yes Everrett Coombe, MD  Multiple Vitamin (MULTIVITAMIN WITH MINERALS) TABS tablet Take 1 tablet by mouth every morning.    Yes [provider]  Omega-3 Fatty Acids (FISH OIL) 1000 MG CAPS Take 1 capsule by mouth every morning.    Yes [provider]  rosuvastatin (  CRESTOR) 5 MG tablet Take 5 mg by mouth daily. 11/11/19  Yes [provider]  sertraline (ZOLOFT) 25 MG tablet Take 1 tablet (25 mg total) by mouth daily. 05/06/18 12/12/19 Yes Everrett Coombe, MD  tiZANidine (ZANAFLEX) 2 MG tablet TAKE 1 TABLET BY MOUTH EVERY 8 HOURS AS NEEDED FOR MUSCLE RELAXER 08/18/19  Yes [provider]    Allergies as of 12/12/2019 - Review Complete 12/12/2019  Allergen Reaction Noted  . Levaquin [levofloxacin] Other (See Comments) 03/02/2016  . Oxycodone Nausea Only 01/09/2018  . Statins Other (See Comments) 08/31/2013    Family  History  Problem Relation Age of Onset  . Lung cancer Mother           . Hypertension Mother   . Cancer Mother   . Diabetes Father   . Hypertension Father   . Lung cancer Father        deceased age 43  . Cancer Father   . Colon cancer Sister        deceased May 08, 2014, age 67  . Pancreatic cancer Neg Hx     Social History   Socioeconomic History  . Marital status: Widowed    Spouse name: Not on file  . Number of children: 5  . Years of education: 73  . Highest education level: Not on file  Occupational History  . 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 Smoking status: Never Smoker  . Smokeless tobacco: Never Used  Vaping Use  . Vaping Use: Never used  Substance and Sexual Activity  . Alcohol use: No  . Drug use: No  . Sexual activity: Yes    Birth control/protection: None, Post-menopausal  Other Topics Concern  . Not on file  Social History Narrative   Lives alone   [facility] for exercise   Social Determinants of Health   Financial Resource Strain:   . Difficulty of Paying Living Expenses: Not on file  Food Insecurity:   . Worried About Charity fundraiser in the Last Year: Not on file  . Ran Out of Food in the Last Year: Not on file  Transportation Needs:   . Lack of Transportation (Medical): Not on file  . Lack of Transportation (Non-Medical): Not on file  Physical Activity:   . Days of Exercise per Week: Not on file  . Minutes of Exercise per Session: Not on file  Stress:   . Feeling of Stress : Not on file  Social Connections:   . Frequency of Communication with Friends and Family: Not on file  . Frequency of Social Gatherings with Friends and Family: Not on file  . Attends Religious Services: Not on file  . Active Member of Clubs or Organizations: Not on file  . Attends Archivist Meetings: Not on file  . Marital Status: Not on file  Intimate Partner Violence:   . Fear of Current or Ex-Partner: Not on file  . Emotionally  Abused: Not on file  . Physically Abused: Not on file  . Sexually Abused: Not on file    Review of Systems: See HPI, otherwise negative ROS  Physical Exam: BP 134/79   Pulse 90   Temp 97.8 F (36.6 C) (Temporal)   Ht 5\' 1"  (1.549 m)   Wt 126 lb (57.2 kg)   LMP 02/07/1999 (Approximate)   BMI 23.81 kg/m  General:   Alert,  Well-developed, well-nourished, pleasant and cooperative in NAD Neck:  Supple; no masses  or thyromegaly. No significant cervical adenopathy. Lungs:  Clear throughout to auscultation.   No wheezes, crackles, or rhonchi. No acute distress. Heart:  Regular rate and rhythm; no murmurs, clicks, rubs,  or gallops. Abdomen: Well-healed vertical midline surgical scar.  Abdomen is soft and nontender without appreciable mass organomegaly  Pulses:  Normal pulses noted. Extremities:  Without clubbing or edema.  Impression/Plan: 69 year old lady with a family history of colon cancer in a first-degree relative at a young age referred for colorectal cancer screening.  Negative colonoscopy 2016.  In the interim, underwent a partial pancreatectomy and a splenectomy for pancreatic tumor.  Clinically, apparently doing very well from that episode.  I need to obtain records from [facility] to complete her database.  Most recent issue is bilateral pulmonary emboli found about 1 month ago at [facility] for which she is now on Eliquis.  Aside from constipation, she is devoid of any lower GI tract symptoms.  Positive family history colon cancer first-degree relative at a young age.  Colonoscopy is a reasonable thing to do.  However, we need to hold off for now given the recent diagnosis of pulmonary embolus.  Chronic constipation without any alarm features.   Recommendations:  Begin Linzess 72 - one capsule daily - samples x 2 weeks.  Please call in 2 weeks and let us know how you are doing.  We need to hold of on your colonoscopy until next year  Office visit with [place] in 3  months   Notice: This dictation was prepared with Dragon dictation along with smaller phrase technology. Any transcriptional errors that result from this process are unintentional and may not be corrected upon review.

## 2019-12-23 DIAGNOSIS — Z23 Encounter for immunization: Secondary | ICD-10-CM | POA: Diagnosis not present

## 2020-01-15 DIAGNOSIS — L309 Dermatitis, unspecified: Secondary | ICD-10-CM | POA: Diagnosis not present

## 2020-01-20 DIAGNOSIS — L309 Dermatitis, unspecified: Secondary | ICD-10-CM | POA: Diagnosis not present

## 2020-02-02 DIAGNOSIS — M791 Myalgia, unspecified site: Secondary | ICD-10-CM | POA: Diagnosis not present

## 2020-02-02 DIAGNOSIS — Z20822 Contact with and (suspected) exposure to covid-19: Secondary | ICD-10-CM | POA: Diagnosis not present

## 2020-02-03 DIAGNOSIS — F064 Anxiety disorder due to known physiological condition: Secondary | ICD-10-CM | POA: Diagnosis not present

## 2020-02-03 DIAGNOSIS — R21 Rash and other nonspecific skin eruption: Secondary | ICD-10-CM | POA: Diagnosis not present

## 2020-02-03 DIAGNOSIS — F4521 Hypochondriasis: Secondary | ICD-10-CM | POA: Diagnosis not present

## 2020-02-11 DIAGNOSIS — M79671 Pain in right foot: Secondary | ICD-10-CM | POA: Diagnosis not present

## 2020-02-25 ENCOUNTER — Ambulatory Visit (INDEPENDENT_AMBULATORY_CARE_PROVIDER_SITE_OTHER): Payer: Medicare Other | Admitting: Orthopedic Surgery

## 2020-02-25 ENCOUNTER — Ambulatory Visit: Payer: Medicare Other

## 2020-02-25 ENCOUNTER — Encounter: Payer: Self-pay | Admitting: Orthopedic Surgery

## 2020-02-25 ENCOUNTER — Other Ambulatory Visit: Payer: Self-pay

## 2020-02-25 VITALS — BP 123/80 | HR 84 | Ht 61.0 in | Wt 120.0 lb

## 2020-02-25 DIAGNOSIS — M79674 Pain in right toe(s): Secondary | ICD-10-CM

## 2020-02-25 DIAGNOSIS — S92514A Nondisplaced fracture of proximal phalanx of right lesser toe(s), initial encounter for closed fracture: Secondary | ICD-10-CM | POA: Diagnosis not present

## 2020-02-25 NOTE — Progress Notes (Signed)
New Patient Visit  Assessment: Crystal Cooke is a 70 y.o. female with the following: Minimally displaced fracture of the right fourth toe, proximal phalanx  Plan: Continue weightbearing as tolerated, primarily through the heel. Okay to transition to a regular shoe as tolerated. Fourth toe was buddy taped to the third toe in clinic today.  She can continue with this for comfort Anticipate progressive improvement in pain and swelling.  Follow-up in approximately 6 weeks.  Follow-up: Return in about 6 weeks (around 04/07/2020).  Subjective:  Chief Complaint  Patient presents with  . Toe Pain    Right 4th toe since 02/11/20     History of Present Illness: Crystal Cooke is a 70 y.o. female who presents to clinic today for evaluation of her right foot.  She sustained an injury to the right fourth toe, when she stubbed this walking in her house.  She noted immediate pain and swelling.  She was evaluated in the urgent care center, approximately 2 weeks ago and x-rays at that time demonstrated a minimally displaced fracture of the proximal phalanx.  She has been able to ambulate in a regular shoe, but states she has been wearing regular shoes.  She notes persistent swelling in the foot, which is concerning to her.  She is not taking any medications on a consistent basis.  No numbness or tingling.  No pain elsewhere.   Review of Systems: No fevers or chills No numbness or tingling No chest pain No shortness of breath No bowel or bladder dysfunction No GI distress No headaches   Medical History:  Past Medical History:  Diagnosis Date  . Allergy   . Anxiety   . Atrial fibrillation ([place])    h/o a fib,  NSR with last EKG, has never seen a cardiologist per pt   . DDD (degenerative disc disease), lumbar   . Hypercholesteremia   . Hypertension   . Leg DVT (deep venous thromboembolism), acute, left ([place]) 01/09/2018    Past Surgical History:  Procedure Laterality Date  . COLONOSCOPY   05/05/14   Dr.Benson- normal colonoscopy, retroflexed views revealed no abnormalities.   . ESOPHAGOGASTRODUODENOSCOPY  05/05/14   Dr.Benson- normal EGD, retroflexed views revealed no abnormalities   . EUS N/A 09/24/2014   Procedure: UPPER ENDOSCOPIC ULTRASOUND (EUS) RADIAL;  Surgeon: Milus Banister, MD;  [facility];  Service: Endoscopy;  Laterality: N/A;  . KNEE ARTHROSCOPY Right 01/05/2017   Procedure: RIGHT KNEE ARTHROSCOPY, PARTIAL LATERAL MENISCECTOMY,;  Surgeon: Marybelle Killings, MD;  Location: [facility];  Service: Orthopedics;  Laterality: Right;  . none    . PANCREAS SURGERY     partial    Family History  Problem Relation Age of Onset  . Lung cancer Mother           . Hypertension Mother   . Cancer Mother   . Diabetes Father   . Hypertension Father   . Lung cancer Father        deceased age 75  . Cancer Father   . Colon cancer Sister        deceased 05/14/14, age 62  . Pancreatic cancer Neg Hx    Social History   Tobacco Use  . Smoking status: Never Smoker  . Smokeless tobacco: Never Used  Vaping Use  . Vaping Use: Never used  Substance Use Topics  . Alcohol use: No  . Drug use: No    Allergies  Allergen Reactions  . Levaquin [Levofloxacin]  Other (See Comments)    "tendon damage"  . Oxycodone Nausea Only    Dizziness and headaches  . Statins Other (See Comments)    "takes the taste out of my mouth    Current Meds  Medication Sig  . acetaminophen (TYLENOL) 500 MG tablet Take 1,000 mg by mouth every 6 (six) hours as needed for mild pain.  Marland Kitchen CARTIA XT 300 MG 24 hr capsule Take 1 capsule by mouth once daily in the morning  . cetirizine (ZYRTEC) 5 MG tablet Take 1 tablet (5 mg total) by mouth daily as needed for allergies.  Marland Kitchen ELIQUIS 5 MG TABS tablet Take 5 mg by mouth 2 (two) times daily.  . Multiple Vitamin (MULTIVITAMIN WITH MINERALS) TABS tablet Take 1 tablet by mouth every morning.   . Omega-3 Fatty Acids (FISH OIL) 1000 MG CAPS Take  1 capsule by mouth every morning.  . rosuvastatin (CRESTOR) 5 MG tablet Take 5 mg by mouth daily.  Marland Kitchen tiZANidine (ZANAFLEX) 2 MG tablet TAKE 1 TABLET BY MOUTH EVERY 8 HOURS AS NEEDED FOR MUSCLE RELAXER    Objective: BP 123/80   Pulse 84   Ht 5\' 1"  (1.549 m)   Wt 120 lb (54.4 kg)   LMP 02/07/1999 (Approximate)   BMI 22.67 kg/m   Physical Exam:  General: Alert and oriented, no acute distress Gait: Mildly antalgic right-sided gait.  Evaluation the right foot demonstrates swelling to the fourth toe.  No ecchymosis is appreciated.  Tenderness to palpation along the length of the fourth toe.  Toes warm and well perfused.  No additional injuries are noted.  Sensation is intact distally.    IMAGING: I personally ordered and reviewed the following images   X-rays of the right fourth toe demonstrates a minimally displaced fracture to the proximal phalanx.  Fracture is not intra-articular.  Overall alignment remains acceptable.  No additional injuries are noted.  Impression: Right fourth toe, proximal phalanx fracture in acceptable alignment.   New Medications:  No orders of the defined types were placed in this encounter.     Mordecai Rasmussen, MD  02/25/2020 11:43 AM

## 2020-03-16 ENCOUNTER — Ambulatory Visit: Payer: Medicare Other | Admitting: Internal Medicine

## 2020-03-26 DIAGNOSIS — M25562 Pain in left knee: Secondary | ICD-10-CM | POA: Diagnosis not present

## 2020-03-26 DIAGNOSIS — M25561 Pain in right knee: Secondary | ICD-10-CM | POA: Diagnosis not present

## 2020-04-01 DIAGNOSIS — R631 Polydipsia: Secondary | ICD-10-CM | POA: Diagnosis not present

## 2020-04-01 DIAGNOSIS — R945 Abnormal results of liver function studies: Secondary | ICD-10-CM | POA: Diagnosis not present

## 2020-04-01 DIAGNOSIS — J309 Allergic rhinitis, unspecified: Secondary | ICD-10-CM | POA: Diagnosis not present

## 2020-04-01 DIAGNOSIS — I2694 Multiple subsegmental pulmonary emboli without acute cor pulmonale: Secondary | ICD-10-CM | POA: Diagnosis not present

## 2020-04-01 DIAGNOSIS — R42 Dizziness and giddiness: Secondary | ICD-10-CM | POA: Diagnosis not present

## 2020-04-01 DIAGNOSIS — R319 Hematuria, unspecified: Secondary | ICD-10-CM | POA: Diagnosis not present

## 2020-04-01 DIAGNOSIS — E782 Mixed hyperlipidemia: Secondary | ICD-10-CM | POA: Diagnosis not present

## 2020-04-01 DIAGNOSIS — M545 Low back pain, unspecified: Secondary | ICD-10-CM | POA: Diagnosis not present

## 2020-04-01 DIAGNOSIS — Z96652 Presence of left artificial knee joint: Secondary | ICD-10-CM | POA: Diagnosis not present

## 2020-04-01 DIAGNOSIS — Z0189 Encounter for other specified special examinations: Secondary | ICD-10-CM | POA: Diagnosis not present

## 2020-04-01 DIAGNOSIS — R944 Abnormal results of kidney function studies: Secondary | ICD-10-CM | POA: Diagnosis not present

## 2020-04-01 DIAGNOSIS — I10 Essential (primary) hypertension: Secondary | ICD-10-CM | POA: Diagnosis not present

## 2020-04-07 ENCOUNTER — Ambulatory Visit: Payer: Medicare Other | Admitting: Orthopedic Surgery

## 2020-04-08 ENCOUNTER — Encounter: Payer: Self-pay | Admitting: Orthopedic Surgery

## 2020-04-13 ENCOUNTER — Ambulatory Visit (INDEPENDENT_AMBULATORY_CARE_PROVIDER_SITE_OTHER): Payer: Medicare Other | Admitting: Internal Medicine

## 2020-04-13 ENCOUNTER — Other Ambulatory Visit: Payer: Self-pay

## 2020-04-13 ENCOUNTER — Encounter: Payer: Self-pay | Admitting: Internal Medicine

## 2020-04-13 VITALS — BP 134/82 | HR 86 | Temp 97.1°F | Ht 61.0 in | Wt 118.4 lb

## 2020-04-13 DIAGNOSIS — Z800 Family history of malignant neoplasm of digestive organs: Secondary | ICD-10-CM | POA: Diagnosis not present

## 2020-04-13 DIAGNOSIS — K5909 Other constipation: Secondary | ICD-10-CM

## 2020-04-13 NOTE — Progress Notes (Signed)
Primary Care Physician:  Celene Squibb, MD Primary Gastroenterologist:  Dr. Gala Romney  Pre-Procedure History & Physical: HPI:  Crystal Cooke is a 70 y.o. female here for for follow-up chronic constipation.  Patient is due/overdue for high risk screening colonoscopy given history of her sister with colon cancer.  She has personal history of pancreatic tumor removed and splenectomy at [facility] back in 2016.  She is regularly followed over there without any apparent recurrence.  I saw her last fall to contemplate a high risk screening colonoscopy.  She had just been diagnosed with a pulmonary embolus in September of last year.  Has been on Eliquis.  Plan as I understand it is for her to remain on Eliquis indefinitely. She was prescribed Linzess by me at her last office visit.  She was concerned about potential side effects and did not take it.  She has been trying to manage constipation with prune juice taken regularly.  However she feels intermittently bloated and does not feel she evacuates well.  MiraLAX did not work well for this nice lady.  Past Medical History:  Diagnosis Date  . Allergy   . Anxiety   . Atrial fibrillation ([place])    h/o a fib,  NSR with last EKG, has never seen a cardiologist per pt   . DDD (degenerative disc disease), lumbar   . Hypercholesteremia   . Hypertension   . Leg DVT (deep venous thromboembolism), acute, left ([place]) 01/09/2018    Past Surgical History:  Procedure Laterality Date  . COLONOSCOPY  05/05/14   Dr.Benson- normal colonoscopy, retroflexed views revealed no abnormalities.   . ESOPHAGOGASTRODUODENOSCOPY  05/05/14   Dr.Benson- normal EGD, retroflexed views revealed no abnormalities   . EUS N/A 09/24/2014   Procedure: UPPER ENDOSCOPIC ULTRASOUND (EUS) RADIAL;  Surgeon: Milus Banister, MD;  [facility];  Service: Endoscopy;  Laterality: N/A;  . KNEE ARTHROSCOPY Right 01/05/2017   Procedure: RIGHT KNEE ARTHROSCOPY, PARTIAL LATERAL MENISCECTOMY,;   Surgeon: Marybelle Killings, MD;  Location: [place];  Service: Orthopedics;  Laterality: Right;  . none    . PANCREAS SURGERY     partial    Prior to Admission medications   Medication Sig Start Date End Date Taking? Authorizing Provider  acetaminophen (TYLENOL) 500 MG tablet Take 1,000 mg by mouth every 6 (six) hours as needed for mild pain.   Yes [provider]  CARTIA XT 300 MG 24 hr capsule Take 1 capsule by mouth once daily in the morning 05/12/19  Yes [name], MD  cetirizine (ZYRTEC) 5 MG tablet Take 1 tablet (5 mg total) by mouth daily as needed for allergies. 04/10/18  Yes Everrett Coombe, MD  ELIQUIS 5 MG TABS tablet Take 5 mg by mouth 2 (two) times daily. 11/11/19  Yes [provider]  fluticasone (FLONASE) 50 MCG/ACT nasal spray Place 2 sprays into both nostrils daily. Patient taking differently: Place 2 sprays into both nostrils as needed. 04/10/18 12/12/19 Yes Everrett Coombe, MD  Multiple Vitamin (MULTIVITAMIN WITH MINERALS) TABS tablet Take 1 tablet by mouth every morning.    Yes [provider]  Omega-3 Fatty Acids (FISH OIL) 1000 MG CAPS Take 1 capsule by mouth every morning.   Yes [provider]  rosuvastatin (CRESTOR) 5 MG tablet Take 5 mg by mouth daily. Sometimes doesn't take everyday 11/11/19  Yes [provider]  sertraline (ZOLOFT) 25 MG tablet Take 1 tablet (25 mg total) by mouth daily. Patient taking  differently: Take 25 mg by mouth as needed. 05/06/18 12/12/19 Yes Everrett Coombe, MD  traMADol (ULTRAM) 50 MG tablet Take 50 mg by mouth every 8 (eight) hours as needed. 01/05/20  Yes [provider]    Allergies as of 04/13/2020 - Review Complete 04/13/2020  Allergen Reaction Noted  . Levaquin [levofloxacin] Other (See Comments) 03/02/2016  . Oxycodone Nausea Only 01/09/2018  . Statins Other (See Comments) 08/31/2013    Family History  Problem Relation Age of Onset  . Lung cancer Mother           .  Hypertension Mother   . Cancer Mother   . Diabetes Father   . Hypertension Father   . Lung cancer Father        deceased age 2  . Cancer Father   . Colon cancer Sister        deceased 2014-05-13, age 21  . Pancreatic cancer Neg Hx     Social History   Socioeconomic History  . Marital status: Widowed    Spouse name: Not on file  . Number of children: 5  . Years of education: 74  . Highest education level: Not on file  Occupational History  . 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 Smoking status: Never Smoker  . Smokeless tobacco: Never Used  Vaping Use  . Vaping Use: Never used  Substance and Sexual Activity  . Alcohol use: No  . Drug use: No  . Sexual activity: Yes    Birth control/protection: None, Post-menopausal  Other Topics Concern  . Not on file  Social History Narrative   Lives alone   [place] for exercise   Social Determinants of Health   Financial Resource Strain: Not on file  Food Insecurity: Not on file  Transportation Needs: Not on file  Physical Activity: Not on file  Stress: Not on file  Social Connections: Not on file  Intimate Partner Violence: Not on file    Review of Systems: See HPI, otherwise negative ROS  Physical Exam: BP 134/82   Pulse 86   Temp (!) 97.1 F (36.2 C) (Temporal)   Ht 5\' 1"  (1.549 m)   Wt 118 lb 6.4 oz (53.7 kg)   LMP 02/07/1999 (Approximate)   BMI 22.37 kg/m  General:   Alert,  Well-developed, well-nourished, pleasant and cooperative in NAD Neck:  Supple; no masses or thyromegaly. No significant cervical adenopathy. Lungs:  Clear throughout to auscultation.   No wheezes, crackles, or rhonchi. No acute distress. Heart:  Regular rate and rhythm; no murmurs, clicks, rubs,  or gallops. Abdomen: Non-distended, normal bowel sounds.  Soft and nontender without appreciable mass or hepatosplenomegaly.  Pulses:  Normal pulses noted. Extremities:  Without clubbing or edema.  Impression/Plan: 70 year old  lady with longstanding chronic constipation suboptimally managed on prune juice as above.   She was hesitant to try Linzess previously.  Had a lengthy discussion about the risks and benefits.  She is now willing to give a low dose a trial run.  In addition, she is due/overdue for high risk screening colonoscopy given her family history.  Diagnosed with pulmonary embolism a little over 6 months ago.  Patient has been on Eliquis since that time.  I have offered her a high risk screening colonoscopy at this time.  Her Eliquis dosing regimen will need to be addressed around the time of the procedure.  Recommendations:  Constipation information provided  Trial of Linzess 72-1 capsule daily  x2 weeks.  Samples provided  Please let me know how you are doing in 2 weeks on Linzess and we will go from there  We will schedule a high risk screening colonoscopy -ASA 3/propofol.  The risks, benefits, limitations, alternatives and imponderables have been reviewed with the patient. Questions have been answered. All parties are agreeable.   Eliquis will need to be stopped for 2 days prior to the procedure.  We will touch base with patient's  hematologist/oncologist at [facility] to get their okay to take this approach..  Further recommendations to follow.    Notice: This dictation was prepared with Dragon dictation along with smaller phrase technology. Any transcriptional errors that result from this process are unintentional and may not be corrected upon review.

## 2020-04-13 NOTE — Patient Instructions (Signed)
Constipation information provided  Trial of Linzess 72-1 capsule daily x2 weeks.  Samples provided  Please let me know how you are doing in 2 weeks on Linzess and we will go from there  We will schedule a high risk screening colonoscopy -ASA 3/propofol  Eliquis will need to be stopped for 2 days prior to the procedure.  We will touch base with your hematologist/oncologist at [facility] to get their okay.  Further recommendations to follow.

## 2020-04-16 ENCOUNTER — Telehealth: Payer: Self-pay

## 2020-04-16 NOTE — Telephone Encounter (Signed)
Pt was seen on Tuesday 04/13/20 with Dr. Gala Romney. Eliquis will need to be stopped for 2 days prior to pts procedure.  I've contacted the hematologist/oncology department at [facility] and they didn't have record of pt. I spoke with pt to ask if she had a doctors name so I can contact them. Pt is going to call back with the doctor she saw Monday. Clearance letter will be sent when this is confirmed.

## 2020-04-27 ENCOUNTER — Telehealth: Payer: Self-pay | Admitting: Internal Medicine

## 2020-04-27 NOTE — Telephone Encounter (Signed)
Left a detailed message for pt. When I spoke with pt previously, she was suppose to give me the name of her doctors. When I called [facility], they had no record of pt.

## 2020-04-27 NOTE — Telephone Encounter (Signed)
[phone] PLEASE CALL PATIENT, SHE IS SUPPOSED TO LET YOU KONW ABOUT A MEDICATION THAT SHE NEEDS TO STOP FOR A PROCEDURE

## 2020-04-27 NOTE — Telephone Encounter (Signed)
Spoke with pt. Pt states the doctor she saw at [facility] name is Dr. Azzie Glatter. Per pt, Dr. Eustace Charda Janis office told pt that she would need to reach out to her PCP Dr. Wende Neighbors to have them contact her office. I will reach out to Dr. Eustace Damen [place] office.

## 2020-04-27 NOTE — Telephone Encounter (Signed)
Fowarding to [place] to follow up on clearance

## 2020-04-28 NOTE — Telephone Encounter (Signed)
[place] and was given a different number to contact pts doctor. Dr. Clent Demark is pts PCP. I will send a letter to Dr. Lissa Merlin. Fax [phone], phone [phone].

## 2020-04-29 NOTE — Telephone Encounter (Signed)
Letter faxed to Dr. Eustace Eathan Groman office.

## 2020-05-04 NOTE — Telephone Encounter (Signed)
Dr. Gala Romney, I received a letter from Dr. Eustace Jahmya Onofrio office stating that pt hasn't been seen by them. I have called at different times and one person will say that pt has been seen and another will say she has never been seen at [facility]. Please advise if it's ok if I go through pts PCP to get clearance for her to have procedure?

## 2020-05-05 DIAGNOSIS — G4700 Insomnia, unspecified: Secondary | ICD-10-CM | POA: Diagnosis not present

## 2020-05-05 DIAGNOSIS — I2694 Multiple subsegmental pulmonary emboli without acute cor pulmonale: Secondary | ICD-10-CM | POA: Diagnosis not present

## 2020-05-05 DIAGNOSIS — F419 Anxiety disorder, unspecified: Secondary | ICD-10-CM | POA: Diagnosis not present

## 2020-05-05 DIAGNOSIS — I10 Essential (primary) hypertension: Secondary | ICD-10-CM | POA: Diagnosis not present

## 2020-05-05 DIAGNOSIS — R945 Abnormal results of liver function studies: Secondary | ICD-10-CM | POA: Diagnosis not present

## 2020-05-05 DIAGNOSIS — R944 Abnormal results of kidney function studies: Secondary | ICD-10-CM | POA: Diagnosis not present

## 2020-05-05 DIAGNOSIS — R7303 Prediabetes: Secondary | ICD-10-CM | POA: Diagnosis not present

## 2020-05-05 DIAGNOSIS — J309 Allergic rhinitis, unspecified: Secondary | ICD-10-CM | POA: Diagnosis not present

## 2020-05-05 DIAGNOSIS — Z96652 Presence of left artificial knee joint: Secondary | ICD-10-CM | POA: Diagnosis not present

## 2020-05-05 DIAGNOSIS — E782 Mixed hyperlipidemia: Secondary | ICD-10-CM | POA: Diagnosis not present

## 2020-05-05 DIAGNOSIS — H9203 Otalgia, bilateral: Secondary | ICD-10-CM | POA: Diagnosis not present

## 2020-05-05 NOTE — Telephone Encounter (Signed)
Note placed with orders. Will call pt to schedule once we receive Dr. Roseanne Kaufman June schedule

## 2020-05-05 NOTE — Telephone Encounter (Signed)
FYI, I received a letter from Dr. Wende Neighbors. Pt is ok to be off Eliquis 2 days prior to TCS and then restart ASAP after procedure. Letter will be scanned in chart.

## 2020-05-06 NOTE — Telephone Encounter (Signed)
Noted  

## 2020-05-06 NOTE — Telephone Encounter (Signed)
noted 

## 2020-05-06 NOTE — Telephone Encounter (Signed)
We will go with Dr. Durene Cal permission.  Thanks

## 2020-05-24 ENCOUNTER — Telehealth: Payer: Self-pay | Admitting: *Deleted

## 2020-05-24 MED ORDER — PEG 3350-KCL-NA BICARB-NACL 420 G PO SOLR: ORAL | 0 refills | Status: DC

## 2020-05-24 NOTE — Telephone Encounter (Signed)
Called pt. She has been scheduled for TCS with propofol, ASA 3 with Dr. Gala Romney on 6/6 at 1:45pm. Aware will mail prep instructions with pre-op/covid test appt. Confirmed pharmacy/address.

## 2020-05-25 ENCOUNTER — Encounter: Payer: Self-pay | Admitting: *Deleted

## 2020-06-23 DIAGNOSIS — Z1159 Encounter for screening for other viral diseases: Secondary | ICD-10-CM | POA: Diagnosis not present

## 2020-06-23 DIAGNOSIS — H6123 Impacted cerumen, bilateral: Secondary | ICD-10-CM | POA: Diagnosis not present

## 2020-06-25 DIAGNOSIS — Z202 Contact with and (suspected) exposure to infections with a predominantly sexual mode of transmission: Secondary | ICD-10-CM | POA: Diagnosis not present

## 2020-06-25 DIAGNOSIS — R634 Abnormal weight loss: Secondary | ICD-10-CM | POA: Diagnosis not present

## 2020-06-25 DIAGNOSIS — Z86711 Personal history of pulmonary embolism: Secondary | ICD-10-CM | POA: Diagnosis not present

## 2020-06-28 DIAGNOSIS — A6000 Herpesviral infection of urogenital system, unspecified: Secondary | ICD-10-CM | POA: Insufficient documentation

## 2020-06-28 NOTE — Progress Notes (Signed)
Crystal Cooke, [place] [address]   CLINIC:  Medical Oncology/Hematology  Patient Care Team: Celene Squibb, MD as PCP - General (Internal Medicine) Gala Romney Cristopher Estimable, MD as Consulting Physician (Gastroenterology)  CHIEF COMPLAINTS/PURPOSE OF CONSULTATION:  Evaluation of history of pulmonary embolism  HISTORY OF PRESENTING ILLNESS:  Crystal Cooke 70 y.o. female is here because of an evaluation of a history of pulmonary embolism, at the request of Dr. Delphina Cahill.  She is scheduled for a routine colonoscopy in 2 weeks.  She is currently taking Eliquis. She had a left leg DVT on 01/09/2018, a few weeks after knee replacement surgery. She started taking Eliquis once daily for 3 months. She is currently taking Eliquis BID. At that time her energy levels were unaffected. She received her first Covid immunization [employer]) in 03/012021 and she received her second dose on 05/06/2019. She denies any bleeding issues. She reports improved breathing. She reports occasional sharp CP that lasts 20 minutes that goes away upon laying down. This pain has occurred for 3 weeks, and is worsened with movement of arms, inhalation, and exhalation. She denies any cardiac issues, and she is not currently seeing a cardiologist.   She has never had  A miscarriage and she denies a family history of miscarriages, lupus, RA, or blood clots of legs or lungs. Her sister colon cancer, her father had lung cancer, and her brother had lung cancer. Her mother also had lung cancer. Her mother and brother smoked, but her father did not smoke. She is widowed and lives alone. 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 She has never smoked.  MEDICAL HISTORY:  Past Medical History:  Diagnosis Date  . Allergy   . Anxiety   . Atrial fibrillation ([place])    h/o a fib,  NSR with last EKG, has never seen a cardiologist per pt   . DDD (degenerative disc disease), lumbar   .  Hypercholesteremia   . Hypertension   . Leg DVT (deep venous thromboembolism), acute, left ([place]) 01/09/2018    SURGICAL HISTORY: Past Surgical History:  Procedure Laterality Date  . COLONOSCOPY  05/05/14   Dr.Benson- normal colonoscopy, retroflexed views revealed no abnormalities.   . ESOPHAGOGASTRODUODENOSCOPY  05/05/14   Dr.Benson- normal EGD, retroflexed views revealed no abnormalities   . EUS N/A 09/24/2014   Procedure: UPPER ENDOSCOPIC ULTRASOUND (EUS) RADIAL;  Surgeon: Milus Banister, MD;  Location[facility];  Service: Endoscopy;  Laterality: N/A;  . KNEE ARTHROSCOPY Right 01/05/2017   Procedure: RIGHT KNEE ARTHROSCOPY, PARTIAL LATERAL MENISCECTOMY,;  Surgeon: Marybelle Killings, MD;  Location: [place];  Service: Orthopedics;  Laterality: Right;  . none    . PANCREAS SURGERY     partial    SOCIAL HISTORY: Social History   Socioeconomic History  . Marital status: Widowed    Spouse name: Not on file  . Number of children: 5  . Years of education: 65  . Highest education level: Not on file  Occupational History  . 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 Smoking status: Never Smoker  . Smokeless tobacco: Never Used  Vaping Use  . Vaping Use: Never used  Substance and Sexual Activity  . Alcohol use: No  . Drug use: No  . Sexual activity: Yes    Birth control/protection: None, Post-menopausal  Other Topics Concern  . Not on file  Social History Narrative   Lives alone   [place] for exercise   Social Determinants of Health   Financial Resource Strain: Medium Risk  . Difficulty of Paying Living Expenses: Somewhat hard  Food Insecurity: No Food Insecurity  . Worried About Charity fundraiser in the Last Year: Never true  . Ran Out of Food in the Last Year: Never true  Transportation Needs: No Transportation Needs  . Lack of Transportation (Medical): No  . Lack of Transportation (Non-Medical): No  Physical Activity: Inactive   . Days of Exercise per Week: 0 days  . Minutes of Exercise per Session: 0 min  Stress: Stress Concern Present  . Feeling of Stress : To some extent  Social Connections: Socially Isolated  . Frequency of Communication with Friends and Family: More than three times a week  . Frequency of Social Gatherings with Friends and Family: Once a week  . Attends Religious Services: Never  . Active Member of Clubs or Organizations: No  . Attends Archivist Meetings: Never  . Marital Status: Widowed  Intimate Partner Violence: Not At Risk  . Fear of Current or Ex-Partner: No  . Emotionally Abused: No  . Physically Abused: No  . Sexually Abused: No    FAMILY HISTORY: Family History  Problem Relation Age of Onset  . Lung cancer Mother           . Hypertension Mother   . Cancer Mother   . Diabetes Father   . Hypertension Father   . Lung cancer Father        deceased age 51  . Cancer Father   . Colon cancer Sister        deceased Jun 04, 2014, age 53  . Pancreatic cancer Neg Hx     ALLERGIES:  is allergic to levaquin [levofloxacin], oxycodone, and statins.  MEDICATIONS:  Current Outpatient Medications  Medication Sig Dispense Refill  . ALPRAZolam (XANAX) 0.5 MG tablet Take 0.5 mg by mouth daily as needed.    Marland Kitchen azithromycin (ZITHROMAX) 500 MG tablet Take 1,000 mg by mouth once.    Marland Kitchen CARTIA XT 300 MG 24 hr capsule Take 1 capsule by mouth once daily in the morning 90 capsule 0  . ELIQUIS 5 MG TABS tablet Take 5 mg by mouth 2 (two) times daily.    . hydrOXYzine (ATARAX/VISTARIL) 50 MG tablet TAKE 1 TABLET BY MOUTH EVERY 4 TO 6 HOURS AS NEEDED FOR ITCHING    . Multiple Vitamin (MULTIVITAMIN WITH MINERALS) TABS tablet Take 1 tablet by mouth every morning.     . rosuvastatin (CRESTOR) 5 MG tablet Take 5 mg by mouth daily. Sometimes doesn't take everyday    . sulfamethoxazole-trimethoprim (BACTRIM DS) 800-160 MG tablet Take 1 tablet by mouth 2 (two) times daily.    Marland Kitchen acetaminophen (TYLENOL)  500 MG tablet Take 1,000 mg by mouth every 6 (six) hours as needed for mild pain. (Patient not taking: Reported on 06/29/2020)    . cetirizine (ZYRTEC) 5 MG tablet Take 1 tablet (5 mg total) by mouth daily as needed for allergies. (Patient not taking: Reported on 06/29/2020) 30 tablet 0  . sertraline (ZOLOFT) 25 MG tablet Take 1 tablet (25 mg total) by mouth daily. (Patient not taking: Reported on 06/29/2020) 30 tablet 0  . traMADol (ULTRAM) 50 MG tablet Take 50 mg by mouth every 8 (eight) hours as needed. (Patient not taking: Reported on 06/29/2020)     No current facility-administered medications for this visit.  REVIEW OF SYSTEMS:   Review of Systems  Constitutional: Positive for appetite change (75%) and fatigue (75%).  Cardiovascular: Positive for chest pain (sharp; occasional).  Neurological: Positive for headaches.  Hematological: Does not bruise/bleed easily.  Psychiatric/Behavioral: The patient is nervous/anxious.   All other systems reviewed and are negative.    PHYSICAL EXAMINATION: ECOG PERFORMANCE STATUS: 1 - Symptomatic but completely ambulatory  Vitals:   06/29/20 1331  BP: 122/78  Pulse: 78  Resp: 18  Temp: 97.8 F (36.6 C)  SpO2: 98%   Filed Weights   06/29/20 1331  Weight: 119 lb 11.2 oz (54.3 kg)   Physical Exam Vitals reviewed.  Constitutional:      Appearance: Normal appearance.  Cardiovascular:     Rate and Rhythm: Normal rate and regular rhythm.     Pulses: Normal pulses.     Heart sounds: Normal heart sounds.  Pulmonary:     Effort: Pulmonary effort is normal.     Breath sounds: Normal breath sounds.  Chest:  Breasts:     Left: Tenderness present.    Abdominal:     Palpations: Abdomen is soft. There is no hepatomegaly, splenomegaly or mass.     Tenderness: There is no abdominal tenderness.  Musculoskeletal:     Right lower leg: No edema.     Left lower leg: No edema.  Neurological:     General: No focal deficit present.     Mental  Status: She is alert and oriented to person, place, and time.  Psychiatric:        Mood and Affect: Mood normal.        Behavior: Behavior normal.      LABORATORY DATA:  I have reviewed the data as listed Recent Results (from the past 2160 hour(s))  D-dimer, quantitative     Status: None   Collection Time: 06/29/20  2:11 PM  Result Value Ref Range   D-Dimer, Quant <0.27 0.00 - 0.50 ug/mL-FEU    Comment: (NOTE) At the manufacturer cut-off value of 0.5 g/mL FEU, this assay has a negative predictive value of 95-100%.This assay is intended for use in conjunction with a clinical pretest probability (PTP) assessment model to exclude pulmonary embolism (PE) and deep venous thrombosis (DVT) in outpatients suspected of PE or DVT. Results should be correlated with clinical presentation. Performed at [facility], [address], [place], [place] [address]     RADIOGRAPHIC STUDIES: I have personally reviewed the radiological images as listed and agreed with the findings in the report. No results found.  ASSESSMENT:  1.  Recurrent DVT and PE: - Provoked left leg popliteal vein DVT on 01/09/2018, 2 weeks after left knee surgery. - She was treated with 3 months of Xarelto. - CTAP on 10/16/2019 for surveillance of resected IPMN showed incidental filling defect in the right lower lobe segmental pulmonary artery concerning for pulmonary embolism. - CT chest angiogram on 10/16/2019 shows acute right lower lobe subsegmental pulmonary artery and left lower lobe segmental and subsegmental pulmonary artery thrombi. - She did not have any provoking factors. - She has been on Eliquis since then.  No history of miscarriages.  2.  Social/family history: -She is widowed.  She lives at home by herself.  She is a retired Quarry manager, worked in [employer]. - Never smoker.  No family history of lupus anticoagulant or miscarriages.  No family history of connective tissue disorders.  No family history of  thrombosis. - Sister died of colon cancer.  Father died of lung cancer and was a non-smoker.  Brother and mother died of lung cancer, both smokers.  3.  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 - Pathology showed IPMN, 5 cm, low-grade, 0/11 lymph nodes involved. - She also had liver cyst resection which was benign.   PLAN:  1.  Recurrent DVT and PE: - She had first episode of left leg DVT in 2019 which was provoked after knee surgery. - Second episode of pulmonary embolism on 10/16/2019 was unprovoked. - Hence she will benefit from indefinite anticoagulation.  No further hypercoagulable testing needed at this time. - I have checked a D-dimer today which was undetectable. - She reports left chest pain, sometimes positional and occasionally gets worse with breathing.  She also has mild tenderness in the area lateral to her sternum.  She gets this pain on exertion and sometimes at rest. - Because of atypical chest pain, recommended cardiology evaluation. - RTC 6 months to evaluate risk-benefit ratio.  We will also check D-dimer at that time.   All questions were answered. The patient knows to call the clinic with any problems, questions or concerns.   Derek Jack, MD 06/29/20 5:41 PM  [place] [phone]   I, Thana Ates, am acting as a scribe for Dr. Derek Jack.  I, Derek Jack MD, have reviewed the above documentation for accuracy and completeness, and I agree with the above.

## 2020-06-29 ENCOUNTER — Inpatient Hospital Stay (HOSPITAL_COMMUNITY): Payer: Medicare Other

## 2020-06-29 ENCOUNTER — Other Ambulatory Visit: Payer: Self-pay

## 2020-06-29 ENCOUNTER — Inpatient Hospital Stay (HOSPITAL_COMMUNITY): Payer: Medicare Other | Attending: Hematology | Admitting: Hematology

## 2020-06-29 VITALS — BP 122/78 | HR 78 | Temp 97.8°F | Resp 18 | Wt 119.7 lb

## 2020-06-29 DIAGNOSIS — Z86718 Personal history of other venous thrombosis and embolism: Secondary | ICD-10-CM | POA: Insufficient documentation

## 2020-06-29 DIAGNOSIS — Z800 Family history of malignant neoplasm of digestive organs: Secondary | ICD-10-CM | POA: Insufficient documentation

## 2020-06-29 DIAGNOSIS — Z86711 Personal history of pulmonary embolism: Secondary | ICD-10-CM | POA: Diagnosis not present

## 2020-06-29 DIAGNOSIS — Z801 Family history of malignant neoplasm of trachea, bronchus and lung: Secondary | ICD-10-CM | POA: Diagnosis not present

## 2020-06-29 DIAGNOSIS — Z802 Family history of malignant neoplasm of other respiratory and intrathoracic organs: Secondary | ICD-10-CM

## 2020-06-29 DIAGNOSIS — Z7901 Long term (current) use of anticoagulants: Secondary | ICD-10-CM | POA: Diagnosis not present

## 2020-06-29 LAB — D-DIMER, QUANTITATIVE: D-Dimer, Quant: <0.27 ug/mL-FEU (ref 0.00–0.50)

## 2020-06-29 NOTE — Patient Instructions (Signed)
[place] [facility] Discharge Instructions  You were seen and examined today by Dr. Delton Coombes. Dr. Delton Coombes is a hematologist, meaning he specializes in the management of blood conditions. Dr. Delton Coombes discussed your past medical history, family history of cancer/blood disorders, and the events that led to you being here today.   You were referred to Dr. Delton Coombes to see how long you will require anticoagulation with Eliquis following the blood clots in your lungs. Without symptoms, such as shortness of breath, there is no role to re-scan to check the blood clots that were in your lung. Given that your symptoms have resolved, the Eliquis has done it's job.   Stop the blood thinner for two days prior to your colonoscopy. Dr. Delton Coombes has recommended a referral to cardiology for the chest pain that you have been experiencing.  Dr. 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 There is no further testing required.   Thank you for choosing [place] [facility] to provide your oncology and hematology care.  To afford each patient quality time with our provider, please arrive at least 15 minutes before your scheduled appointment time.   If you have a lab appointment with the [place] please come in thru the Main Entrance and check in at the main information desk.  You need to re-schedule your appointment should you arrive 10 or more minutes late.  We strive to give you quality time with our providers, and arriving late affects you and other patients whose appointments are after yours.  Also, if you no show three or more times for appointments you may be dismissed from the clinic at the providers discretion.     Again, thank you for choosing [facility].  Our hope is that these requests will decrease the amount of time that you wait  before being seen by our physicians.       _____________________________________________________________  Should you have questions after your visit to [facility], please contact our office at [phone] and follow the prompts.  Our office hours are 8:00 a.m. and 4:30 p.m. Monday - Friday.  Please note that voicemails left after 4:00 p.m. may not be returned until the following business day.  We are closed weekends and major holidays.  You do have access to a nurse 24-7, just call the main number to the clinic [phone] and do not press any options, hold on the line and a nurse will answer the phone.    For prescription refill requests, have your pharmacy contact our office and allow 72 hours.    Due to Covid, you will need to wear a mask upon entering the hospital. If you do not have a mask, a mask will be given to you at the Main Entrance upon arrival. For doctor visits, patients may have 1 support person age 37 or older with them. For treatment visits, patients can not have anyone with them due to social distancing guidelines and our immunocompromised population.

## 2020-07-07 NOTE — Patient Instructions (Signed)
Crystal Cooke  07/07/2020     @PREFPERIOPPHARMACY @   Your procedure is scheduled on  07/12/2020.   Report to [facility] at  1145  A.M.   Call this number if you have problems the morning of surgery:  [phone]   Remember:  Follow the diet and prep instructions given to you by the office.                     Take these medicines the morning of surgery with A SIP OF WATER  Xanax(if needed), cartia XT, ultram (if needed).     Please brush your teeth.  Do not wear jewelry, make-up or nail polish.  Do not wear lotions, powders, or perfumes, or deodorant.  Do not shave 48 hours prior to surgery.  Men may shave face and neck.  Do not bring valuables to the hospital.  [facility] is not responsible for any belongings or valuables.  Contacts, dentures or bridgework may not be worn into surgery.  Leave your suitcase in the car.  After surgery it may be brought to your room.  For patients admitted to the hospital, discharge time will be determined by your treatment team.  Patients discharged the day of surgery will not be allowed to drive home and must have someone with them for 24 hours.    Special instructions:  DO NOT smoke tobacco or vape for 24 hours before your procedure.  Please read over the following fact sheets that you were given. Anesthesia Post-op Instructions and Care and Recovery After Surgery       Colonoscopy, Adult, Care After This sheet gives you information about how to care for yourself after your procedure. Your health care provider may also give you more specific instructions. If you have problems or questions, contact your health care provider. What can I expect after the procedure? After the procedure, it is common to have:  A small amount of blood in your stool for 24 hours after the procedure.  Some gas.  Mild cramping or bloating of your abdomen. Follow these instructions at home: Eating and drinking  Drink enough fluid to keep your  urine pale yellow.  Follow instructions from your health care provider about eating or drinking restrictions.  Resume your normal diet as instructed by your health care provider. Avoid heavy or fried foods that are hard to digest.   Activity  Rest as told by your health care provider.  Avoid sitting for a long time without moving. Get up to take short walks every 1-2 hours. This is important to improve blood flow and breathing. Ask for help if you feel weak or unsteady.  Return to your normal activities as told by your health care provider. Ask your health care provider what activities are safe for you. Managing cramping and bloating  Try walking around when you have cramps or feel bloated.  Apply heat to your abdomen as told by your health care provider. Use the heat source that your health care provider recommends, such as a moist heat pack or a heating pad. ? Place a towel between your skin and the heat source. ? Leave the heat on for 20-30 minutes. ? Remove the heat if your skin turns bright red. This is especially important if you are unable to feel pain, heat, or cold. You may have a greater risk of getting burned.   General instructions  If you were given a sedative during  the procedure, it can affect you for several hours. Do not drive or operate machinery until your health care provider says that it is safe.  For the first 24 hours after the procedure: ? Do not sign important documents. ? Do not drink alcohol. ? Do your regular daily activities at a slower pace than normal. ? Eat soft foods that are easy to digest.  Take over-the-counter and prescription medicines only as told by your health care provider.  Keep all follow-up visits as told by your health care provider. This is important. Contact a health care provider if:  You have blood in your stool 2-3 days after the procedure. Get help right away if you have:  More than a small spotting of blood in your  stool.  Large blood clots in your stool.  Swelling of your abdomen.  Nausea or vomiting.  A fever.  Increasing pain in your abdomen that is not relieved with medicine. Summary  After the procedure, it is common to have a small amount of blood in your stool. You may also have mild cramping and bloating of your abdomen.  If you were given a sedative during the procedure, it can affect you for several hours. Do not drive or operate machinery until your health care provider says that it is safe.  Get help right away if you have a lot of blood in your stool, nausea or vomiting, a fever, or increased pain in your abdomen. This information is not intended to replace advice given to you by your health care provider. Make sure you discuss any questions you have with your health care provider. Document Revised: 01/17/2019 Document Reviewed: 08/19/2018 Elsevier Patient Education  2021 [place] After This sheet gives you information about how to care for yourself after your procedure. Your health care provider may also give you more specific instructions. If you have problems or questions, contact your health care provider. What can I expect after the procedure? After the procedure, it is common to have:  Tiredness.  Forgetfulness about what happened after the procedure.  Impaired judgment for important decisions.  Nausea or vomiting.  Some difficulty with balance. Follow these instructions at home: For the time period you were told by your health care provider:  Rest as needed.  Do not participate in activities where you could fall or become injured.  Do not drive or use machinery.  Do not drink alcohol.  Do not take sleeping pills or medicines that cause drowsiness.  Do not make important decisions or sign legal documents.  Do not take care of children on your own.      Eating and drinking  Follow the diet that is recommended by your  health care provider.  Drink enough fluid to keep your urine pale yellow.  If you vomit: ? Drink water, juice, or soup when you can drink without vomiting. ? Make sure you have little or no nausea before eating solid foods. General instructions  Have a responsible adult stay with you for the time you are told. It is important to have someone help care for you until you are awake and alert.  Take over-the-counter and prescription medicines only as told by your health care provider.  If you have sleep apnea, surgery and certain medicines can increase your risk for breathing problems. Follow instructions from your health care provider about wearing your sleep device: ? Anytime you are sleeping, including during daytime naps. ? While taking prescription pain  medicines, sleeping medicines, or medicines that make you drowsy.  Avoid smoking.  Keep all follow-up visits as told by your health care provider. This is important. Contact a health care provider if:  You keep feeling nauseous or you keep vomiting.  You feel light-headed.  You are still sleepy or having trouble with balance after 24 hours.  You develop a rash.  You have a fever.  You have redness or swelling around the IV site. Get help right away if:  You have trouble breathing.  You have new-onset confusion at home. Summary  For several hours after your procedure, you may feel tired. You may also be forgetful and have poor judgment.  Have a responsible adult stay with you for the time you are told. It is important to have someone help care for you until you are awake and alert.  Rest as told. Do not drive or operate machinery. Do not drink alcohol or take sleeping pills.  Get help right away if you have trouble breathing, or if you suddenly become confused. This information is not intended to replace advice given to you by your health care provider. Make sure you discuss any questions you have with your health care  provider. Document Revised: 10/09/2019 Document Reviewed: 12/26/2018 Elsevier Patient Education  2021 [employer].

## 2020-07-08 ENCOUNTER — Other Ambulatory Visit (HOSPITAL_COMMUNITY): Payer: Medicare Other | Attending: Internal Medicine

## 2020-07-08 ENCOUNTER — Encounter (HOSPITAL_COMMUNITY)
Admission: RE | Admit: 2020-07-08 | Discharge: 2020-07-08 | Disposition: A | Discharge status: Home / Self Care | Payer: Medicare Other | Source: Ambulatory Visit | Attending: Internal Medicine | Admitting: Internal Medicine

## 2020-07-08 ENCOUNTER — Other Ambulatory Visit: Payer: Self-pay

## 2020-07-08 ENCOUNTER — Telehealth: Payer: Self-pay | Admitting: *Deleted

## 2020-07-08 DIAGNOSIS — Z01810 Encounter for preprocedural cardiovascular examination: Secondary | ICD-10-CM | POA: Insufficient documentation

## 2020-07-08 NOTE — Telephone Encounter (Signed)
-----   Message from Encarnacion Chu, RN sent at 07/08/2020  3:23 PM EDT ----- Patient will be cancelled until follow up with cardiology for recent chest pain. Patient was diagnosed with blood clots in both lungs in  September 2021 and is being treated with Eliquis.  Patient is concerned about stopping the Eliquis. According to Dr. Tomie China note , the patient should follow up with cardiology.  Anesthesia would like cardiac clearance before proceeding with the colonoscopy. The referral for cardiac clearance has been sent.

## 2020-07-08 NOTE — Telephone Encounter (Signed)
Called pt and she was already aware. FYI to Dr. Gala Romney

## 2020-07-08 NOTE — Telephone Encounter (Signed)
Communication noted.  

## 2020-07-11 DIAGNOSIS — B370 Candidal stomatitis: Secondary | ICD-10-CM | POA: Diagnosis not present

## 2020-07-12 ENCOUNTER — Ambulatory Visit (HOSPITAL_COMMUNITY): Admission: RE | Admit: 2020-07-12 | Payer: Medicare Other | Source: Home / Self Care | Admitting: Internal Medicine

## 2020-07-12 ENCOUNTER — Encounter (HOSPITAL_COMMUNITY): Admission: RE | Payer: Self-pay | Source: Home / Self Care

## 2020-07-12 SURGERY — COLONOSCOPY WITH PROPOFOL
Anesthesia: Monitor Anesthesia Care

## 2020-07-22 DIAGNOSIS — R519 Headache, unspecified: Secondary | ICD-10-CM | POA: Diagnosis not present

## 2020-07-22 DIAGNOSIS — I10 Essential (primary) hypertension: Secondary | ICD-10-CM | POA: Diagnosis not present

## 2020-07-22 DIAGNOSIS — Z885 Allergy status to narcotic agent status: Secondary | ICD-10-CM | POA: Diagnosis not present

## 2020-07-22 DIAGNOSIS — Z79899 Other long term (current) drug therapy: Secondary | ICD-10-CM | POA: Diagnosis not present

## 2020-07-22 DIAGNOSIS — E876 Hypokalemia: Secondary | ICD-10-CM | POA: Diagnosis not present

## 2020-07-22 DIAGNOSIS — R9431 Abnormal electrocardiogram [ECG] [EKG]: Secondary | ICD-10-CM | POA: Diagnosis not present

## 2020-08-26 DIAGNOSIS — R944 Abnormal results of kidney function studies: Secondary | ICD-10-CM | POA: Diagnosis not present

## 2020-08-26 DIAGNOSIS — G609 Hereditary and idiopathic neuropathy, unspecified: Secondary | ICD-10-CM | POA: Diagnosis not present

## 2020-08-26 DIAGNOSIS — Z0001 Encounter for general adult medical examination with abnormal findings: Secondary | ICD-10-CM | POA: Diagnosis not present

## 2020-08-26 DIAGNOSIS — I10 Essential (primary) hypertension: Secondary | ICD-10-CM | POA: Diagnosis not present

## 2020-08-26 DIAGNOSIS — Z96652 Presence of left artificial knee joint: Secondary | ICD-10-CM | POA: Diagnosis not present

## 2020-08-26 DIAGNOSIS — E782 Mixed hyperlipidemia: Secondary | ICD-10-CM | POA: Diagnosis not present

## 2020-08-26 DIAGNOSIS — R7303 Prediabetes: Secondary | ICD-10-CM | POA: Diagnosis not present

## 2020-08-26 DIAGNOSIS — Z86711 Personal history of pulmonary embolism: Secondary | ICD-10-CM | POA: Diagnosis not present

## 2020-08-26 DIAGNOSIS — Z0000 Encounter for general adult medical examination without abnormal findings: Secondary | ICD-10-CM | POA: Diagnosis not present

## 2020-09-16 ENCOUNTER — Ambulatory Visit: Payer: Medicare Other | Admitting: Internal Medicine

## 2020-10-21 NOTE — Progress Notes (Signed)
Cardiology Office Note   Date:  10/22/2020   ID:  Crystal, Cooke 10-25-1950, MRN [id number]  PCP:  Celene Squibb, MD  Cardiologist:   Dorris Carnes, MD   Pt referred for eval of CP     History of Present Illness: Crystal Cooke is a 70 y.o. 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   Ascorbic Acid (VITAMIN C) 1000 MG tablet Take 1,000 mg by mouth daily.   b complex vitamins capsule Take 1 capsule by mouth daily.   CARTIA XT 300 MG 24 hr capsule Take 1 capsule by mouth once daily in the morning (Patient taking differently: Take 300 mg by mouth daily.)   cholecalciferol (VITAMIN D3) 25 MCG (1000 UNIT) tablet Take 1,000 Units by mouth daily.   Potassium 99 MG TABS Take 99 mg by mouth daily.   rosuvastatin (CRESTOR) 5 MG tablet Take 5 mg by mouth daily. Sometimes doesn't take everyday   traMADol (ULTRAM) 50 MG tablet Take 50 mg by mouth every 8 (eight) hours as needed for severe pain.   XARELTO 10 MG TABS tablet    zinc gluconate 50 MG tablet Take 50 mg by mouth daily.     Allergies:   Levaquin [levofloxacin], Oxycodone, and Statins   Past Medical History:  Diagnosis Date   Allergy    Anxiety    Atrial fibrillation ([place])    h/o a fib,  NSR with last EKG, has never seen a  cardiologist per pt    DDD (degenerative disc disease), lumbar    Hypercholesteremia    Hypertension    Leg DVT (deep venous thromboembolism), acute, left ([place]) 01/09/2018    Past Surgical History:  Procedure Laterality Date   COLONOSCOPY  05/05/14   Dr.Benson- normal colonoscopy, retroflexed views revealed no abnormalities.    ESOPHAGOGASTRODUODENOSCOPY  05/05/14   Dr.Benson- normal EGD, retroflexed views revealed no abnormalities    EUS N/A 09/24/2014   Procedure: UPPER ENDOSCOPIC ULTRASOUND (EUS) RADIAL;  Surgeon: Milus Banister, MD;  Location: [facility];  Service: Endoscopy;  Laterality: N/A;   KNEE ARTHROSCOPY Right 01/05/2017   Procedure: RIGHT KNEE ARTHROSCOPY, PARTIAL LATERAL MENISCECTOMY,;  Surgeon: Marybelle Killings, MD;  Location: [facility];  Service: Orthopedics;  Laterality: Right;   none     PANCREAS SURGERY     partial     Social History:  The patient  reports that she has never smoked. She has never used smokeless tobacco. She reports that she does not drink alcohol and does  not use drugs.   Family History:  The patient's family history includes Cancer in her father and mother; Colon cancer in her sister; Diabetes in her father; Hypertension in her father and mother; Lung cancer in her father and mother.    ROS:  Please see the history of present illness. All other systems are reviewed and  Negative to the above problem except as noted.    PHYSICAL EXAM: VS:  BP 138/82   Pulse 83   Ht '5\' 2"'$  (1.575 m)   Wt 123 lb 3.2 oz (55.9 kg)   LMP 02/07/1999 (Approximate)   SpO2 98%   BMI 22.53 kg/m   GEN: Well nourished, well developed, in no acute distress  HEENT: normal  Neck: no JVD, carotid bruits, or masses Cardiac: RRR; no murmurs, rubs, or gallops,no edema  Respiratory:  clear to auscultation bilaterally GI: soft, nontender, nondistended, + BS  No hepatomegaly  MS: no deformity Moving all extremities   Skin: warm and dry, no rash Neuro:  Strength  and sensation are intact Psych: euthymic mood, full affect   EKG:  EKG is not ordered today.   Lipid Panel    Component Value Date/Time   CHOL 245 (H) 03/12/2017 1410   TRIG 129 03/12/2017 1410   HDL 66 03/12/2017 1410   CHOLHDL 3.7 03/12/2017 1410   CHOLHDL 3.1 11/07/2016 0918   VLDL 17 04/28/2016 1428   LDLCALC 153 (H) 03/12/2017 1410   LDLCALC 160 (H) 11/07/2016 0918      Wt Readings from Last 3 Encounters:  10/22/20 123 lb 3.2 oz (55.9 kg)  07/08/20 118 lb (53.5 kg)  06/29/20 119 lb 11.2 oz (54.3 kg)      ASSESSMENT AND PLAN:  1  Chest pain   Pains are aatypical  I do not think ischemia  2  Hx PE   Long term anticoagulation since recurrent,unprovoked   Will get CBC  3  HTN   ON Cartia    BP a little up    She is anxious  Follow for now.    5  Atherosclerosis   CT in 2016 showed abdomal aorta with plaquing    Optimize risks  6   HL Check labs today   On Crestor  though may not be taking regularly   May need to increase dose  6   K   Low on last check  Now on OTC K  Recheck  F/U in 1 year   Sooner for problems   Current medicines are reviewed at length with the patient today.  The patient does not have concerns regarding medicines.  Signed, Dorris Carnes, MD  10/22/2020 10:05 AM    [address], [place], [place]  [address] Phone: [phone]; Fax: [phone]

## 2020-10-22 ENCOUNTER — Encounter: Payer: Self-pay | Admitting: Internal Medicine

## 2020-10-22 ENCOUNTER — Ambulatory Visit (INDEPENDENT_AMBULATORY_CARE_PROVIDER_SITE_OTHER): Payer: Medicare HMO | Admitting: Internal Medicine

## 2020-10-22 ENCOUNTER — Other Ambulatory Visit: Payer: Self-pay

## 2020-10-22 VITALS — BP 138/82 | HR 83 | Ht 62.0 in | Wt 123.2 lb

## 2020-10-22 DIAGNOSIS — E785 Hyperlipidemia, unspecified: Secondary | ICD-10-CM

## 2020-10-22 DIAGNOSIS — I10 Essential (primary) hypertension: Secondary | ICD-10-CM | POA: Diagnosis not present

## 2020-10-22 DIAGNOSIS — Z79899 Other long term (current) drug therapy: Secondary | ICD-10-CM

## 2020-10-22 NOTE — Patient Instructions (Signed)
Medication Instructions:  Your physician recommends that you continue on your current medications as directed. Please refer to the Current Medication list given to you today.  *If you need a refill on your cardiac medications before your next appointment, please call your pharmacy*   Lab Work: Your physician recommends that you return for lab work in: Today   If you have labs (blood work) drawn today and your tests are completely normal, you will receive your results only by: MyChart Message (if you have MyChart) OR A paper copy in the mail If you have any lab test that is abnormal or we need to change your treatment, we will call you to review the results.   Testing/Procedures: NONE    Follow-Up: At [facility], you and your health needs are our priority.  As part of our continuing mission to provide you with exceptional heart care, we have created designated Provider Care Teams.  These Care Teams include your primary Cardiologist (physician) and Advanced Practice Providers (APPs -  Physician Assistants and Nurse Practitioners) who all work together to provide you with the care you need, when you need it.  We recommend signing up for the patient portal called "MyChart".  Sign up information is provided on this After Visit Summary.  MyChart is used to connect with patients for Virtual Visits (Telemedicine).  Patients are able to view lab/test results, encounter notes, upcoming appointments, etc.  Non-urgent messages can be sent to your provider as well.   To learn more about what you can do with MyChart, go to [email or web address].    Your next appointment:   1 year(s)  The format for your next appointment:   In Person  Provider:   Dorris Carnes, MD   Other Instructions Thank you for choosing [facility]!

## 2020-10-25 ENCOUNTER — Other Ambulatory Visit: Payer: Self-pay | Admitting: Internal Medicine

## 2020-10-25 DIAGNOSIS — E785 Hyperlipidemia, unspecified: Secondary | ICD-10-CM | POA: Diagnosis not present

## 2020-10-25 DIAGNOSIS — I10 Essential (primary) hypertension: Secondary | ICD-10-CM | POA: Diagnosis not present

## 2020-10-25 DIAGNOSIS — Z79899 Other long term (current) drug therapy: Secondary | ICD-10-CM | POA: Diagnosis not present

## 2020-10-28 ENCOUNTER — Telehealth: Payer: Self-pay | Admitting: *Deleted

## 2020-10-28 DIAGNOSIS — Z79899 Other long term (current) drug therapy: Secondary | ICD-10-CM

## 2020-10-28 DIAGNOSIS — E785 Hyperlipidemia, unspecified: Secondary | ICD-10-CM

## 2020-10-28 DIAGNOSIS — I10 Essential (primary) hypertension: Secondary | ICD-10-CM

## 2020-10-28 LAB — LIPID PANEL
Cholesterol: 204 mg/dL — ABNORMAL HIGH (ref <200)
HDL: 73 mg/dL (ref >=50)
LDL Cholesterol (Calc): 119 mg/dL (calc) — ABNORMAL HIGH
Non-HDL Cholesterol (Calc): 131 mg/dL (calc) — ABNORMAL HIGH (ref <130)
Total CHOL/HDL Ratio: 2.8 (calc) (ref <5.0)
Triglycerides: 40 mg/dL (ref <150)

## 2020-10-28 LAB — CBC
HCT: 39.3 % (ref 35.0–45.0)
Hemoglobin: 12.8 g/dL (ref 11.7–15.5)
MCH: 32.4 pg (ref 27.0–33.0)
MCHC: 32.6 g/dL (ref 32.0–36.0)
MCV: 99.5 fL (ref 80.0–100.0)
MPV: 10.6 fL (ref 7.5–12.5)
Platelets: 262 10*3/uL (ref 140–400)
RBC: 3.95 10*6/uL (ref 3.80–5.10)
RDW: 11.4 % (ref 11.0–15.0)
WBC: 5.3 10*3/uL (ref 3.8–10.8)

## 2020-10-28 LAB — BASIC METABOLIC PANEL WITH GFR
BUN: 18 mg/dL (ref 7–25)
CO2: 30 mmol/L (ref 20–32)
Calcium: 9.9 mg/dL (ref 8.6–10.4)
Chloride: 104 mmol/L (ref 98–110)
Creat: 0.85 mg/dL (ref 0.60–1.00)
Glucose, Bld: 122 mg/dL — ABNORMAL HIGH (ref 65–99)
Potassium: 3.4 mmol/L — ABNORMAL LOW (ref 3.5–5.3)
Sodium: 142 mmol/L (ref 135–146)
eGFR: 73 mL/min/{1.73_m2} (ref >=60)

## 2020-10-28 LAB — MAGNESIUM: Magnesium: 2.2 mg/dL (ref 1.5–2.5)

## 2020-10-28 LAB — LIPOPROTEIN A (LPA): Lipoprotein (a): 77 nmol/L — ABNORMAL HIGH (ref <75)

## 2020-10-28 MED ORDER — POTASSIUM CHLORIDE CRYS ER 20 MEQ PO TBCR: 20.0000 meq | EXTENDED_RELEASE_TABLET | Freq: Every day | ORAL | 3 refills | Status: DC

## 2020-10-28 NOTE — Telephone Encounter (Signed)
Crystal Records, MD  10/26/2020  2:09 PM EDT     CBC is normal Electrolytes:   K is a little low   She is on OTC potassium    I would swithc to 20 mEq Daily  Check BMET in 2 wks Lipids   Need tighter control   I would increase Crestor to 10 mg   F/U lipid panel and LFTs in 8 wks     Pt notified and pt voiced understanding .

## 2020-10-28 NOTE — Telephone Encounter (Signed)
-----   Message from Rodman Key, RN sent at 10/27/2020  5:48 PM EDT ----- Crystal Cooke patient.

## 2020-11-03 DIAGNOSIS — R7303 Prediabetes: Secondary | ICD-10-CM | POA: Diagnosis not present

## 2020-11-03 DIAGNOSIS — I10 Essential (primary) hypertension: Secondary | ICD-10-CM | POA: Diagnosis not present

## 2020-11-09 DIAGNOSIS — K8689 Other specified diseases of pancreas: Secondary | ICD-10-CM | POA: Diagnosis not present

## 2020-11-09 DIAGNOSIS — Z23 Encounter for immunization: Secondary | ICD-10-CM | POA: Diagnosis not present

## 2020-11-09 DIAGNOSIS — K869 Disease of pancreas, unspecified: Secondary | ICD-10-CM | POA: Diagnosis not present

## 2020-11-24 DIAGNOSIS — E782 Mixed hyperlipidemia: Secondary | ICD-10-CM | POA: Diagnosis not present

## 2020-11-24 DIAGNOSIS — I10 Essential (primary) hypertension: Secondary | ICD-10-CM | POA: Diagnosis not present

## 2020-11-24 DIAGNOSIS — R944 Abnormal results of kidney function studies: Secondary | ICD-10-CM | POA: Diagnosis not present

## 2020-11-24 DIAGNOSIS — Z86711 Personal history of pulmonary embolism: Secondary | ICD-10-CM | POA: Diagnosis not present

## 2020-11-24 DIAGNOSIS — Z23 Encounter for immunization: Secondary | ICD-10-CM | POA: Diagnosis not present

## 2020-11-24 DIAGNOSIS — Z96652 Presence of left artificial knee joint: Secondary | ICD-10-CM | POA: Diagnosis not present

## 2020-11-24 DIAGNOSIS — M545 Low back pain, unspecified: Secondary | ICD-10-CM | POA: Diagnosis not present

## 2020-11-24 DIAGNOSIS — R7303 Prediabetes: Secondary | ICD-10-CM | POA: Diagnosis not present

## 2020-11-24 DIAGNOSIS — G609 Hereditary and idiopathic neuropathy, unspecified: Secondary | ICD-10-CM | POA: Diagnosis not present

## 2020-11-24 DIAGNOSIS — M25562 Pain in left knee: Secondary | ICD-10-CM | POA: Diagnosis not present

## 2020-11-26 ENCOUNTER — Encounter: Payer: Self-pay | Admitting: *Deleted

## 2020-11-29 ENCOUNTER — Ambulatory Visit: Payer: Medicare Other | Admitting: Internal Medicine

## 2020-12-10 DIAGNOSIS — M25561 Pain in right knee: Secondary | ICD-10-CM | POA: Diagnosis not present

## 2020-12-10 DIAGNOSIS — M25562 Pain in left knee: Secondary | ICD-10-CM | POA: Diagnosis not present

## 2021-01-04 ENCOUNTER — Inpatient Hospital Stay (HOSPITAL_COMMUNITY): Payer: Medicare HMO | Attending: Hematology

## 2021-01-11 ENCOUNTER — Ambulatory Visit (HOSPITAL_COMMUNITY): Payer: Medicare Other | Admitting: Hematology

## 2021-01-11 ENCOUNTER — Ambulatory Visit (HOSPITAL_COMMUNITY): Payer: Medicare HMO | Admitting: Physician Assistant

## 2021-02-02 ENCOUNTER — Other Ambulatory Visit (HOSPITAL_COMMUNITY): Payer: Self-pay | Admitting: Internal Medicine

## 2021-02-02 DIAGNOSIS — Z1231 Encounter for screening mammogram for malignant neoplasm of breast: Secondary | ICD-10-CM

## 2021-02-11 ENCOUNTER — Ambulatory Visit (HOSPITAL_COMMUNITY): Payer: Medicare HMO

## 2021-02-21 ENCOUNTER — Ambulatory Visit (HOSPITAL_COMMUNITY): Payer: Medicare HMO

## 2021-03-03 ENCOUNTER — Ambulatory Visit (HOSPITAL_COMMUNITY)
Admission: RE | Admit: 2021-03-03 | Discharge: 2021-03-03 | Disposition: A | Discharge status: Home / Self Care | Payer: Medicare HMO | Source: Ambulatory Visit | Attending: Internal Medicine | Admitting: Internal Medicine

## 2021-03-03 ENCOUNTER — Other Ambulatory Visit: Payer: Self-pay

## 2021-03-03 DIAGNOSIS — Z1231 Encounter for screening mammogram for malignant neoplasm of breast: Secondary | ICD-10-CM | POA: Diagnosis not present

## 2021-04-28 ENCOUNTER — Encounter (HOSPITAL_COMMUNITY): Payer: Self-pay | Admitting: *Deleted

## 2021-04-28 ENCOUNTER — Emergency Department (HOSPITAL_COMMUNITY)
Admission: EM | Admit: 2021-04-28 | Discharge: 2021-04-28 | Disposition: A | Discharge status: Home / Self Care | Payer: Medicare HMO | Attending: Emergency Medicine | Admitting: Emergency Medicine

## 2021-04-28 ENCOUNTER — Emergency Department (HOSPITAL_COMMUNITY): Payer: Medicare HMO

## 2021-04-28 DIAGNOSIS — E876 Hypokalemia: Secondary | ICD-10-CM | POA: Diagnosis not present

## 2021-04-28 DIAGNOSIS — R0789 Other chest pain: Secondary | ICD-10-CM | POA: Diagnosis not present

## 2021-04-28 DIAGNOSIS — I10 Essential (primary) hypertension: Secondary | ICD-10-CM | POA: Insufficient documentation

## 2021-04-28 DIAGNOSIS — Z7901 Long term (current) use of anticoagulants: Secondary | ICD-10-CM | POA: Insufficient documentation

## 2021-04-28 DIAGNOSIS — R079 Chest pain, unspecified: Secondary | ICD-10-CM | POA: Insufficient documentation

## 2021-04-28 DIAGNOSIS — Z79899 Other long term (current) drug therapy: Secondary | ICD-10-CM | POA: Insufficient documentation

## 2021-04-28 DIAGNOSIS — R0602 Shortness of breath: Secondary | ICD-10-CM | POA: Diagnosis not present

## 2021-04-28 LAB — TROPONIN I (HIGH SENSITIVITY)
Troponin I (High Sensitivity): 4 ng/L (ref <18)
Troponin I (High Sensitivity): 4 ng/L (ref <18)

## 2021-04-28 LAB — BASIC METABOLIC PANEL
Anion gap: 10 (ref 5–15)
BUN: 18 mg/dL (ref 8–23)
CO2: 28 mmol/L (ref 22–32)
Calcium: 9.4 mg/dL (ref 8.9–10.3)
Chloride: 101 mmol/L (ref 98–111)
Creatinine, Ser: 1.05 mg/dL — ABNORMAL HIGH (ref 0.44–1.00)
GFR, Estimated: 56 mL/min — ABNORMAL LOW (ref >60)
Glucose, Bld: 107 mg/dL — ABNORMAL HIGH (ref 70–99)
Potassium: 3.3 mmol/L — ABNORMAL LOW (ref 3.5–5.1)
Sodium: 139 mmol/L (ref 135–145)

## 2021-04-28 LAB — CBC
HCT: 40.1 % (ref 36.0–46.0)
Hemoglobin: 13.1 g/dL (ref 12.0–15.0)
MCH: 33.1 pg (ref 26.0–34.0)
MCHC: 32.7 g/dL (ref 30.0–36.0)
MCV: 101.3 fL — ABNORMAL HIGH (ref 80.0–100.0)
Platelets: 259 10*3/uL (ref 150–400)
RBC: 3.96 MIL/uL (ref 3.87–5.11)
RDW: 12.9 % (ref 11.5–15.5)
WBC: 5.7 10*3/uL (ref 4.0–10.5)
nRBC: 0 % (ref 0.0–0.2)

## 2021-04-28 NOTE — ED Triage Notes (Signed)
Chest pain x 2 days

## 2021-04-28 NOTE — ED Provider Notes (Signed)
?[facility] ?Provider Note ? ? ?CSN: [id number] ?Arrival date & time: 04/28/21  1057 ? ?  ? ?History ? ?Chief Complaint  ?Patient presents with  ? Chest Pain  ? ? ?Crystal Cooke is a 71 y.o. female with history of atrial fibrillation, hypertension, hyperlipidemia, and pulmonary embolism on daily Xarelto who presents to the emergency department with a 2-day history of diffuse chest pain which she describes as a squeezing sensation.  Pain does radiate into the left arm.  It has been constant since onset.  Nothing seems to make this better or worse.  She does report associated shortness of breath which is worse with exertion.  Patient denies any orthopnea or PND.  No leg swelling or leg pain.  No abdominal pain, nausea, vomiting, diarrhea.  No any fever or chills. ? ? ?Chest Pain ? ?  ? ?Home Medications ?Prior to Admission medications   ?Medication Sig Start Date End Date Taking? Authorizing Provider  ?ALPRAZolam (XANAX) 0.5 MG tablet Take 0.5 mg by mouth daily as needed for anxiety. 06/28/20  Yes [provider]  ?Ascorbic Acid (VITAMIN C) 1000 MG tablet Take 1,000 mg by mouth daily.   Yes [provider]  ?CARTIA XT 300 MG 24 hr capsule Take 1 capsule by mouth once daily in the morning ?Patient taking differently: Take 300 mg by mouth daily. 05/12/19  Yes [name], MD  ?cholecalciferol (VITAMIN D3) 25 MCG (1000 UNIT) tablet Take 1,000 Units by mouth daily.   Yes [provider]  ?Multiple Vitamin (MULTIVITAMIN WITH MINERALS) TABS tablet Take 1 tablet by mouth every morning.   Yes [provider]  ?potassium chloride SA (KLOR-CON) 20 MEQ tablet Take 1 tablet (20 mEq total) by mouth daily. 10/28/20  Yes Fay Records, MD  ?rosuvastatin (CRESTOR) 5 MG tablet Take 5 mg by mouth daily. Sometimes doesn't take everyday 11/11/19  Yes [provider]  ?traMADol (ULTRAM) 50 MG tablet Take 50 mg by mouth every 8 (eight) hours as needed for severe pain.  01/05/20  Yes [provider]  ?XARELTO 10 MG TABS tablet 10 mg daily. 08/26/20  Yes [provider]  ?zinc gluconate 50 MG tablet Take 50 mg by mouth daily.   Yes [provider]  ?   ? ?Allergies    ?Levaquin [levofloxacin], Oxycodone, and Statins   ? ?Review of Systems   ?Review of Systems  ?Cardiovascular:  Positive for chest pain.  ?All other systems reviewed and are negative. ? ?Physical Exam ?Updated Vital Signs ?BP 125/89   Pulse 81   Temp 98.3 ?F (36.8 ?C)   Resp 17   Ht '5\' 2"'$  (1.575 m)   Wt 54.4 kg   LMP 02/07/1999 (Approximate)   SpO2 100%   BMI 21.95 kg/m?  ?Physical Exam ?Vitals and nursing note reviewed.  ?Constitutional:   ?   General: She is not in acute distress. ?   Appearance: Normal appearance.  ?HENT:  ?   Head: Normocephalic and atraumatic.  ?Eyes:  ?   General:     ?   Right eye: No discharge.     ?   Left eye: No discharge.  ?Cardiovascular:  ?   Comments: Regular rate and rhythm.  S1/S2 are distinct without any evidence of murmur, rubs, or gallops.  Radial pulses are 2+ bilaterally.  Dorsalis pedis pulses are 2+ bilaterally.  No evidence of pedal edema. ?Pulmonary:  ?   Comments: Clear to auscultation bilaterally.  Normal  effort.  No respiratory distress.  No evidence of wheezes, rales, or rhonchi heard throughout. ?Abdominal:  ?   General: Abdomen is flat. Bowel sounds are normal. There is no distension.  ?   Tenderness: There is no abdominal tenderness. There is no guarding or rebound.  ?Musculoskeletal:     ?   General: Normal range of motion.  ?   Cervical back: Neck supple.  ?Skin: ?   General: Skin is warm and dry.  ?   Findings: No rash.  ?Neurological:  ?   General: No focal deficit present.  ?   Mental Status: She is alert.  ?Psychiatric:     ?   Mood and Affect: Mood normal.     ?   Behavior: Behavior normal.  ? ? ?ED Results / Procedures / Treatments   ?Labs ?(all labs ordered are listed, but only abnormal results are displayed) ?Labs Reviewed   ?BASIC METABOLIC PANEL - Abnormal; Notable for the following components:  ?    Result Value  ? Potassium 3.3 (*)   ? Glucose, Bld 107 (*)   ? Creatinine, Ser 1.05 (*)   ? GFR, Estimated 56 (*)   ? All other components within normal limits  ?CBC - Abnormal; Notable for the following components:  ? MCV 101.3 (*)   ? All other components within normal limits  ?TROPONIN I (HIGH SENSITIVITY)  ?TROPONIN I (HIGH SENSITIVITY)  ? ? ?EKG ?EKG Interpretation ? ?Date/Time:  Thursday April 28 2021 11:19:22 EDT ?Ventricular Rate:  76 ?PR Interval:  176 ?QRS Duration: 75 ?QT Interval:  373 ?QTC Calculation: 420 ?R Axis:   61 ?Text Interpretation: Sinus rhythm Abnormal R-wave progression, early transition ST-t wave abnormality Baseline wander Artifact Abnormal ECG Confirmed by Carmin Muskrat [phone]) on 04/28/2021 11:42:28 AM ? ?Radiology ?DG Chest 2 View ? ?Result Date: 04/28/2021 ?CLINICAL DATA:  chest pain EXAM: CHEST - 2 VIEW COMPARISON:  January 03, 2014. FINDINGS: No consolidation. No visible pleural effusions or pneumothorax. Cardiomediastinal silhouette is within normal limits and similar to prior. No evidence of acute osseous abnormality. IMPRESSION: No evidence of acute cardiopulmonary disease. Electronically Signed   By: Margaretha Sheffield M.D.   On: 04/28/2021 11:58   ? ?Procedures ?Procedures  ? ? ?Medications Ordered in ED ?Medications - No data to display ? ?ED Course/ Medical Decision Making/ A&P ?Clinical Course as of 04/28/21 1500  ?Thu Apr 28, 2021  ?1451 I discussed this case with my attending physician who cosigned this note including patient's presenting symptoms, physical exam, and planned diagnostics and interventions. Attending physician stated agreement with plan or made changes to plan which were implemented.  ? ?Attending physician assessed patient at bedside. ? ? [CF]  ?  ?Clinical Course User Index ?[CF] Myna Bright M, PA-C  ? ?                        ?Medical Decision Making ?Amount and/or  Complexity of Data Reviewed ?Labs: ordered. ?Radiology: ordered. ? ? ?This patient presents to the ED for concern of chest pain, this involves an extensive number of treatment options, and is a complaint that carries with it a high risk of complications and morbidity.  The differential diagnosis includes ACS, cardiac dysrhythmia. I doubt dissection.  Patient's lung auscultation is normal I have a low suspicion for pneumonia or pneumothorax. ? ? ?Co morbidities that complicate the patient evaluation ? ?Past Medical History:  ?Diagnosis Date  ? Allergy   ?  Anxiety   ? Atrial fibrillation ([place])   ? h/o a fib,  NSR with last EKG, has never seen a cardiologist per pt   ? DDD (degenerative disc disease), lumbar   ? Hypercholesteremia   ? Hypertension   ? Leg DVT (deep venous thromboembolism), acute, left ([place]) 01/09/2018  ? ? ?Additional history obtained: ? ?Additional history obtained from nursing note ?External records from outside source obtained and reviewed including cardiology note from October 22, 2020 at that time she was complaining of chest pain which was thought to be atypical.  Echocardiogram on 10/17/2019 was normal with an ejection fraction of 60 to 65%. ? ? ?Lab Tests: ? ?I Ordered, and personally interpreted labs.  The pertinent results include: CBC which was essentially normal.  Initial and delta troponin were negative.  BMP shows mild hypokalemia.  Patient is prescribed potassium supplementation.  I instructed her to either increase that slightly or supplement with bananas.  Creatinine slightly elevated from baseline.  Does not meet AKI criteria. ? ? ?Imaging Studies ordered: ? ?I ordered imaging studies including chest xray  ?I independently visualized and interpreted imaging which showed no acute abnormalities.  No evidence of pneumothorax or pneumonia. ?I agree with the radiologist interpretation ? ? ?Cardiac Monitoring: ? ?The patient was maintained on a cardiac monitor.  I personally viewed and  interpreted the cardiac monitored which showed an underlying rhythm of: Normal sinus rhythm ?EKG was normal without signs of ischemia. ? ? ?Medicines ordered and prescription drug management: ? ?None ? ? ?Problem List / ED Cours

## 2021-04-28 NOTE — ED Notes (Signed)
Patient transported to X-ray 

## 2021-04-28 NOTE — Discharge Instructions (Addendum)
Please follow-up with your cardiologist and primary care provider for further evaluation.  Return to the emergency department for any worsening symptoms. ?

## 2021-05-19 DIAGNOSIS — R7303 Prediabetes: Secondary | ICD-10-CM | POA: Diagnosis not present

## 2021-05-19 DIAGNOSIS — E782 Mixed hyperlipidemia: Secondary | ICD-10-CM | POA: Diagnosis not present

## 2021-05-23 ENCOUNTER — Encounter: Payer: Self-pay | Admitting: Internal Medicine

## 2021-05-26 DIAGNOSIS — K8689 Other specified diseases of pancreas: Secondary | ICD-10-CM | POA: Diagnosis not present

## 2021-05-26 DIAGNOSIS — I10 Essential (primary) hypertension: Secondary | ICD-10-CM | POA: Diagnosis not present

## 2021-05-26 DIAGNOSIS — M25562 Pain in left knee: Secondary | ICD-10-CM | POA: Diagnosis not present

## 2021-05-26 DIAGNOSIS — Z96652 Presence of left artificial knee joint: Secondary | ICD-10-CM | POA: Diagnosis not present

## 2021-05-26 DIAGNOSIS — M545 Low back pain, unspecified: Secondary | ICD-10-CM | POA: Diagnosis not present

## 2021-05-26 DIAGNOSIS — R7303 Prediabetes: Secondary | ICD-10-CM | POA: Insufficient documentation

## 2021-05-26 DIAGNOSIS — E782 Mixed hyperlipidemia: Secondary | ICD-10-CM | POA: Diagnosis not present

## 2021-05-26 DIAGNOSIS — G8929 Other chronic pain: Secondary | ICD-10-CM | POA: Insufficient documentation

## 2021-05-26 DIAGNOSIS — Z86711 Personal history of pulmonary embolism: Secondary | ICD-10-CM | POA: Diagnosis not present

## 2021-05-26 DIAGNOSIS — R69 Illness, unspecified: Secondary | ICD-10-CM | POA: Diagnosis not present

## 2021-05-26 DIAGNOSIS — G609 Hereditary and idiopathic neuropathy, unspecified: Secondary | ICD-10-CM | POA: Insufficient documentation

## 2021-06-07 ENCOUNTER — Ambulatory Visit: Payer: Medicare HMO | Admitting: Internal Medicine

## 2021-06-14 ENCOUNTER — Ambulatory Visit (INDEPENDENT_AMBULATORY_CARE_PROVIDER_SITE_OTHER): Payer: Medicare HMO | Admitting: Internal Medicine

## 2021-06-14 ENCOUNTER — Encounter: Payer: Self-pay | Admitting: Internal Medicine

## 2021-06-14 VITALS — BP 124/64 | HR 82 | Temp 97.5°F | Ht 62.0 in | Wt 127.6 lb

## 2021-06-14 DIAGNOSIS — K5909 Other constipation: Secondary | ICD-10-CM | POA: Diagnosis not present

## 2021-06-14 DIAGNOSIS — Z800 Family history of malignant neoplasm of digestive organs: Secondary | ICD-10-CM

## 2021-06-14 DIAGNOSIS — K625 Hemorrhage of anus and rectum: Secondary | ICD-10-CM

## 2021-06-14 NOTE — Patient Instructions (Addendum)
It was good to see you again today! ? ?As discussed, we will set up a diagnostic colonoscopy (rectal bleeding) ASA 2 in the near future to [facility] ? ?You will stop your Xarelto for 2 days prior to the procedure ? ?Constipation information provided ? ?An alternative to prunes would be to ry kiwi fruit 1-2 a day as needed for constipation ? ?Further recommendations to follow.  She needs updated colonoscopy.  ?

## 2021-06-14 NOTE — Progress Notes (Signed)
? ? ?Primary Care Physician:  Celene Squibb, MD ?Primary Gastroenterologist:  Dr. Gala Romney ? ?Pre-Procedure History & Physical: ?HPI:  Crystal Cooke is a 71 y.o. female here for further evaluation of constipation.  May go 3 more days without a bowel movement ;when she takes prunes and she has a bowel movement.  She has occasional incontinence.  Has occasional rectal bleeding.  last TCS   2016; somewhat overdue for high risk screening.  Sister died of colon cancer at age 57).  Labs to PCPs office 415/2023 white count 12.4 hemoglobin 12.4/hematocrit 36.6 white count 5.5 platelets 265,000 LFTs completely normal except for alkaline phosphatase of 148 ? ?Past Medical History:  ?Diagnosis Date  ? Allergy   ? Anxiety   ? Atrial fibrillation ([place])   ? h/o a fib,  NSR with last EKG, has never seen a cardiologist per pt   ? DDD (degenerative disc disease), lumbar   ? Hypercholesteremia   ? Hypertension   ? Leg DVT (deep venous thromboembolism), acute, left ([place]) 01/09/2018  ? ? ?Past Surgical History:  ?Procedure Laterality Date  ? COLONOSCOPY  05/05/14  ? Dr.Benson- normal colonoscopy, retroflexed views revealed no abnormalities.   ? ESOPHAGOGASTRODUODENOSCOPY  05/05/14  ? Dr.Benson- normal EGD, retroflexed views revealed no abnormalities   ? EUS N/A 09/24/2014  ? Procedure: UPPER ENDOSCOPIC ULTRASOUND (EUS) RADIAL;  Surgeon: Milus Banister, MD;  Location: [facility];  Service: Endoscopy;  Laterality: N/A;  ? KNEE ARTHROSCOPY Right 01/05/2017  ? Procedure: RIGHT KNEE ARTHROSCOPY, PARTIAL LATERAL MENISCECTOMY,;  Surgeon: Marybelle Killings, MD;  Location: [facility];  Service: Orthopedics;  Laterality: Right;  ? none    ? PANCREAS SURGERY    ? partial  ? ? ?Prior to Admission medications   ?Medication Sig Start Date End Date Taking? Authorizing Provider  ?ALPRAZolam (XANAX) 0.5 MG tablet Take 0.5 mg by mouth daily as needed for anxiety. 06/28/20  Yes [provider]  ?Ascorbic Acid (VITAMIN C) 1000 MG tablet  Take 1,000 mg by mouth daily.   Yes [provider]  ?CARTIA XT 300 MG 24 hr capsule Take 1 capsule by mouth once daily in the morning ?Patient taking differently: Take 300 mg by mouth daily. 05/12/19  Yes [name], MD  ?cholecalciferol (VITAMIN D3) 25 MCG (1000 UNIT) tablet Take 1,000 Units by mouth daily.   Yes [provider]  ?potassium chloride SA (KLOR-CON) 20 MEQ tablet Take 1 tablet (20 mEq total) by mouth daily. 10/28/20  Yes Fay Records, MD  ?rosuvastatin (CRESTOR) 5 MG tablet Take 5 mg by mouth daily. Sometimes doesn't take everyday 11/11/19  Yes [provider]  ?traMADol (ULTRAM) 50 MG tablet Take 50 mg by mouth every 8 (eight) hours as needed for severe pain. 01/05/20  Yes [provider]  ?XARELTO 10 MG TABS tablet 10 mg daily. 08/26/20  Yes [provider]  ? ? ?Allergies as of 06/14/2021 - Review Complete 06/14/2021  ?Allergen Reaction Noted  ? Levaquin [levofloxacin] Other (See Comments) 03/02/2016  ? Oxycodone Nausea Only 01/09/2018  ? Statins Other (See Comments) 08/31/2013  ? ? ?Family History  ?Problem Relation Age of Onset  ? Lung cancer Mother   ?        ? Hypertension Mother   ? Cancer Mother   ? Diabetes Father   ? Hypertension Father   ? Lung cancer Father   ?     deceased age 76  ? Cancer Father   ?  Colon cancer Sister   ?     deceased 2014-05-04, age 36  ? Pancreatic cancer Neg Hx   ? ? ?Social History  ? ?Socioeconomic History  ? Marital status: Widowed  ?  Spouse name: Not on file  ? Number of children: 5  ? Years of education: 45  ? Highest education level: Not on file  ?Occupational History  ? 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 Smoking status: Never  ? Smokeless tobacco: Never  ?Vaping Use  ? Vaping Use: Never used  ?Substance and Sexual Activity  ? Alcohol use: No  ? Drug use: No  ? Sexual activity: Yes  ?  Birth control/protection: None, Post-menopausal  ?Other Topics Concern  ? Not on file  ?Social History  Narrative  ? Lives alone  ? Walks for exercise  ? ?Social Determinants of Health  ? ?Financial Resource Strain: Medium Risk  ? Difficulty of Paying Living Expenses: Somewhat hard  ?Food Insecurity: No Food Insecurity  ? Worried About Charity fundraiser in the Last Year: Never true  ? Ran Out of Food in the Last Year: Never true  ?Transportation Needs: No Transportation Needs  ? Lack of Transportation (Medical): No  ? Lack of Transportation (Non-Medical): No  ?Physical Activity: Inactive  ? Days of Exercise per Week: 0 days  ? Minutes of Exercise per Session: 0 min  ?Stress: Stress Concern Present  ? Feeling of Stress : To some extent  ?Social Connections: Socially Isolated  ? Frequency of Communication with Friends and Family: More than three times a week  ? Frequency of Social Gatherings with Friends and Family: Once a week  ? Attends Religious Services: Never  ? Active Member of Clubs or Organizations: No  ? Attends Archivist Meetings: Never  ? Marital Status: Widowed  ?Intimate Partner Violence: Not At Risk  ? Fear of Current or Ex-Partner: No  ? Emotionally Abused: No  ? Physically Abused: No  ? Sexually Abused: No  ? ? ?Review of Systems: ?See HPI, otherwise negative ROS ? ?Physical Exam: ?BP 124/64 (BP Location: Right Arm, Patient Position: Sitting, Cuff Size: Normal)   Pulse 82   Temp (!) 97.5 ?F (36.4 ?C) (Temporal)   Ht '5\' 2"'$  (1.575 m)   Wt 127 lb 9.6 oz (57.9 kg)   LMP 02/07/1999 (Approximate)   SpO2 98%   BMI 23.34 kg/m?  ?General:   Alert,  Well-developed, well-nourished, pleasant and cooperative in NAD ?Neck:  Supple; no masses or thyromegaly. No significant cervical adenopathy. ?Lungs:  Clear throughout to auscultation.   No wheezes, crackles, or rhonchi. No acute distress. ?Heart:  Regular rate and rhythm; no murmurs, clicks, rubs,  or gallops. ?Abdomen: Non-distended, normal bowel sounds.  Soft and nontender without appreciable mass or hepatosplenomegaly.  ?Pulses:  Normal pulses  noted. ?Extremities:  Without clubbing or edema. ? ?Impression/Plan: 71 year old lady with Insidiously progressive constipation, intermittent rectal bleeding, positive family history of colon cancer. Patient is chronically anticoagulated.  ?Isolated elevated alkaline phosphatase of uncertain significance.  ? Patient needs a diagnostic colonoscopy in the near future.  According to Dr. Nevada Crane, she can come off Xarelto for 2 days. ?The risks, benefits, limitations, alternatives and imponderables have been reviewed with the patient. Questions have been answered. All parties are agreeable.   ? ?Repeat hepatic function profile in several weeks. ? ? ? ? ? ? ? ? ?Notice: This dictation was prepared with Dragon dictation along  with smaller phrase technology. Any transcriptional errors that result from this process are unintentional and may not be corrected upon review.  ?

## 2021-06-17 ENCOUNTER — Telehealth: Payer: Self-pay | Admitting: *Deleted

## 2021-06-17 NOTE — Telephone Encounter (Signed)
Lmovm to call back to schedule TCS with Dr. Gala Romney, asa 2 ?

## 2021-06-18 DIAGNOSIS — R350 Frequency of micturition: Secondary | ICD-10-CM | POA: Diagnosis not present

## 2021-06-18 DIAGNOSIS — R0981 Nasal congestion: Secondary | ICD-10-CM | POA: Diagnosis not present

## 2021-06-18 DIAGNOSIS — N390 Urinary tract infection, site not specified: Secondary | ICD-10-CM | POA: Diagnosis not present

## 2021-06-18 DIAGNOSIS — Z20822 Contact with and (suspected) exposure to covid-19: Secondary | ICD-10-CM | POA: Diagnosis not present

## 2021-06-20 ENCOUNTER — Other Ambulatory Visit: Payer: Self-pay

## 2021-06-20 MED ORDER — PEG 3350-KCL-NA BICARB-NACL 420 G PO SOLR: 4000.0000 mL | ORAL | 0 refills | Status: AC

## 2021-06-20 NOTE — Telephone Encounter (Signed)
Spoke to pt, TCS scheduled for 07/25/21 at 9:45am. Advised pt to hold Xarelto for 2 days prior to procedure. Orders entered. Rx for prep sent to pharmacy. Procedure instructions mailed. ?

## 2021-07-14 ENCOUNTER — Ambulatory Visit: Payer: Medicare HMO | Admitting: Neurology

## 2021-07-25 ENCOUNTER — Encounter (HOSPITAL_COMMUNITY): Payer: Self-pay | Admitting: Internal Medicine

## 2021-07-25 ENCOUNTER — Ambulatory Visit (HOSPITAL_COMMUNITY)
Admission: RE | Admit: 2021-07-25 | Discharge: 2021-07-25 | Disposition: A | Discharge status: Home / Self Care | Payer: Medicare HMO | Source: Ambulatory Visit | Attending: Internal Medicine | Admitting: Internal Medicine

## 2021-07-25 ENCOUNTER — Ambulatory Visit (HOSPITAL_BASED_OUTPATIENT_CLINIC_OR_DEPARTMENT_OTHER): Payer: Medicare HMO | Admitting: Anesthesiology

## 2021-07-25 ENCOUNTER — Encounter (HOSPITAL_COMMUNITY)
Admission: RE | Disposition: A | Discharge status: Home / Self Care | Payer: Self-pay | Source: Ambulatory Visit | Attending: Internal Medicine

## 2021-07-25 ENCOUNTER — Ambulatory Visit (HOSPITAL_COMMUNITY): Payer: Medicare HMO | Admitting: Anesthesiology

## 2021-07-25 ENCOUNTER — Other Ambulatory Visit: Payer: Self-pay

## 2021-07-25 DIAGNOSIS — F4322 Adjustment disorder with anxiety: Secondary | ICD-10-CM

## 2021-07-25 DIAGNOSIS — S83104A Unspecified dislocation of right knee, initial encounter: Secondary | ICD-10-CM

## 2021-07-25 DIAGNOSIS — F419 Anxiety disorder, unspecified: Secondary | ICD-10-CM

## 2021-07-25 DIAGNOSIS — K921 Melena: Secondary | ICD-10-CM | POA: Diagnosis not present

## 2021-07-25 DIAGNOSIS — M1990 Unspecified osteoarthritis, unspecified site: Secondary | ICD-10-CM | POA: Diagnosis not present

## 2021-07-25 DIAGNOSIS — I2699 Other pulmonary embolism without acute cor pulmonale: Secondary | ICD-10-CM

## 2021-07-25 DIAGNOSIS — I10 Essential (primary) hypertension: Secondary | ICD-10-CM | POA: Diagnosis not present

## 2021-07-25 DIAGNOSIS — N2889 Other specified disorders of kidney and ureter: Secondary | ICD-10-CM

## 2021-07-25 DIAGNOSIS — I4891 Unspecified atrial fibrillation: Secondary | ICD-10-CM | POA: Diagnosis not present

## 2021-07-25 DIAGNOSIS — M75100 Unspecified rotator cuff tear or rupture of unspecified shoulder, not specified as traumatic: Secondary | ICD-10-CM

## 2021-07-25 DIAGNOSIS — K7689 Other specified diseases of liver: Secondary | ICD-10-CM

## 2021-07-25 DIAGNOSIS — Z86711 Personal history of pulmonary embolism: Secondary | ICD-10-CM | POA: Diagnosis not present

## 2021-07-25 DIAGNOSIS — K5731 Diverticulosis of large intestine without perforation or abscess with bleeding: Secondary | ICD-10-CM

## 2021-07-25 DIAGNOSIS — Z9081 Acquired absence of spleen: Secondary | ICD-10-CM

## 2021-07-25 DIAGNOSIS — Z96659 Presence of unspecified artificial knee joint: Secondary | ICD-10-CM

## 2021-07-25 DIAGNOSIS — Z86718 Personal history of other venous thrombosis and embolism: Secondary | ICD-10-CM | POA: Insufficient documentation

## 2021-07-25 DIAGNOSIS — F411 Generalized anxiety disorder: Secondary | ICD-10-CM

## 2021-07-25 DIAGNOSIS — K640 First degree hemorrhoids: Secondary | ICD-10-CM | POA: Insufficient documentation

## 2021-07-25 DIAGNOSIS — I82402 Acute embolism and thrombosis of unspecified deep veins of left lower extremity: Secondary | ICD-10-CM

## 2021-07-25 DIAGNOSIS — Z79899 Other long term (current) drug therapy: Secondary | ICD-10-CM | POA: Insufficient documentation

## 2021-07-25 DIAGNOSIS — K5730 Diverticulosis of large intestine without perforation or abscess without bleeding: Secondary | ICD-10-CM | POA: Insufficient documentation

## 2021-07-25 DIAGNOSIS — R69 Illness, unspecified: Secondary | ICD-10-CM | POA: Diagnosis not present

## 2021-07-25 DIAGNOSIS — J302 Other seasonal allergic rhinitis: Secondary | ICD-10-CM

## 2021-07-25 DIAGNOSIS — E785 Hyperlipidemia, unspecified: Secondary | ICD-10-CM

## 2021-07-25 DIAGNOSIS — M1711 Unilateral primary osteoarthritis, right knee: Secondary | ICD-10-CM

## 2021-07-25 DIAGNOSIS — K862 Cyst of pancreas: Secondary | ICD-10-CM

## 2021-07-25 SURGERY — COLONOSCOPY WITH PROPOFOL
Anesthesia: General

## 2021-07-25 MED ORDER — PROPOFOL 500 MG/50ML IV EMUL
INTRAVENOUS | Status: DC | PRN
  Administered 2021-07-25: 150 ug/kg/min via INTRAVENOUS

## 2021-07-25 MED ORDER — PROPOFOL 10 MG/ML IV BOLUS
INTRAVENOUS | Status: DC | PRN
  Administered 2021-07-25: 50 mg via INTRAVENOUS
  Administered 2021-07-25: 20 mg via INTRAVENOUS

## 2021-07-25 MED ORDER — LIDOCAINE HCL 1 % IJ SOLN
INTRAMUSCULAR | Status: DC | PRN
  Administered 2021-07-25: 50 mg via INTRADERMAL

## 2021-07-25 MED ORDER — LACTATED RINGERS IV SOLN
INTRAVENOUS | Status: DC
  Administered 2021-07-25: 1000 mL via INTRAVENOUS

## 2021-07-25 NOTE — H&P (Signed)
$'@LOGO'h$ @   Primary Care Physician:  Celene Squibb, MD Primary Gastroenterologist:  Dr. Gala Romney  Pre-Procedure History & Physical: HPI:  Crystal Cooke is a 71 y.o. female here for a diagnostic colonoscopy.  Intermittent rectal bleeding in the setting of progressive constipation.  Chronically anticoagulated on Xarelto.  Sister died with colon cancer.  Negative colonoscopy 2016.  Past Medical History:  Diagnosis Date   Allergy    Anxiety    Atrial fibrillation ([place])    h/o a fib,  NSR with last EKG, has never seen a cardiologist per pt    DDD (degenerative disc disease), lumbar    Hypercholesteremia    Hypertension    Leg DVT (deep venous thromboembolism), acute, left ([place]) 01/09/2018    Past Surgical History:  Procedure Laterality Date   COLONOSCOPY  05/05/14   Dr.Benson- normal colonoscopy, retroflexed views revealed no abnormalities.    ESOPHAGOGASTRODUODENOSCOPY  05/05/14   Dr.Benson- normal EGD, retroflexed views revealed no abnormalities    EUS N/A 09/24/2014   Procedure: UPPER ENDOSCOPIC ULTRASOUND (EUS) RADIAL;  Surgeon: Milus Banister, MD;  Location: [facility];  Service: Endoscopy;  Laterality: N/A;   KNEE ARTHROSCOPY Right 01/05/2017   Procedure: RIGHT KNEE ARTHROSCOPY, PARTIAL LATERAL MENISCECTOMY,;  Surgeon: Marybelle Killings, MD;  Location: [facility];  Service: Orthopedics;  Laterality: Right;   none     PANCREAS SURGERY     partial    Prior to Admission medications   Medication Sig Start Date End Date Taking? Authorizing Provider  acetaminophen (TYLENOL) 500 MG tablet Take 1,000 mg by mouth every 6 (six) hours as needed for moderate pain or mild pain.   Yes [provider]  ALPRAZolam Duanne Moron) 0.5 MG tablet Take 0.5 mg by mouth daily as needed for anxiety. 06/28/20  Yes [provider]  Ascorbic Acid (VITAMIN C) 1000 MG tablet Take 1,000 mg by mouth daily.   Yes [provider]  CARTIA XT 300 MG 24 hr capsule Take 1 capsule by  mouth once daily in the morning Patient taking differently: Take 300 mg by mouth daily. 05/12/19  Yes Cresenzo, Angelyn Punt, MD  Cholecalciferol (VITAMIN D) 50 MCG (2000 UT) CAPS Take 2,000 Units by mouth daily.   Yes [provider]  Garlic 1287 MG CAPS Take 1,000 mg by mouth daily.   Yes [provider]  potassium chloride SA (KLOR-CON) 20 MEQ tablet Take 1 tablet (20 mEq total) by mouth daily. 10/28/20  Yes Fay Records, MD  rosuvastatin (CRESTOR) 5 MG tablet Take 5 mg by mouth daily. Sometimes doesn't take everyday 11/11/19  Yes [provider]  tiZANidine (ZANAFLEX) 2 MG tablet Take 2 mg by mouth every 6 (six) hours as needed for muscle spasms. 05/27/21  Yes [provider]  traMADol (ULTRAM) 50 MG tablet Take 50 mg by mouth every 8 (eight) hours as needed for severe pain. 01/05/20  Yes [provider]  XARELTO 10 MG TABS tablet Take 10 mg by mouth daily. 08/26/20  Yes [provider]  polyethylene glycol-electrolytes (TRILYTE) 420 g solution Take 4,000 mLs by mouth as directed. 06/20/21   Daneil Dolin, MD    Allergies as of 06/20/2021 - Review Complete 06/14/2021  Allergen Reaction Noted   Levaquin [levofloxacin] Other (See Comments) 03/02/2016   Oxycodone Nausea Only 01/09/2018   Statins Other (See Comments) 08/31/2013    Family History  Problem Relation Age of Onset   Lung cancer Mother  Hypertension Mother    Cancer Mother    Diabetes Father    Hypertension Father    Lung cancer Father        deceased age 53   Cancer Father    Colon cancer Sister        deceased 05-13-14, age 44   Pancreatic cancer Neg Hx     Social History   Socioeconomic History   Marital status: Widowed    Spouse name: Not on file   Number of children: 5   Years of education: 28   Highest education level: Not on file  Occupational History   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  Tobacco Use   Smoking status: Never   Smokeless  tobacco: Never  Vaping Use   Vaping Use: Never used  Substance and Sexual Activity   Alcohol use: No   Drug use: No   Sexual activity: Yes    Birth control/protection: None, Post-menopausal  Other Topics Concern   Not on file  Social History Narrative   Lives alone   [place] for exercise   Social Determinants of Health   Financial Resource Strain: Medium Risk (06/29/2020)   Overall Financial Resource Strain (CARDIA)    Difficulty of Paying Living Expenses: Somewhat hard  Food Insecurity: No Food Insecurity (06/29/2020)   Hunger Vital Sign    Worried About Running Out of Food in the Last Year: Never true    Ran Out of Food in the Last Year: Never true  Transportation Needs: No Transportation Needs (06/29/2020)   PRAPARE - Hydrologist (Medical): No    Lack of Transportation (Non-Medical): No  Physical Activity: Inactive (06/29/2020)   Exercise Vital Sign    Days of Exercise per Week: 0 days    Minutes of Exercise per Session: 0 min  Stress: Stress Concern Present (06/29/2020)   [place]    Feeling of Stress : To some extent  Social Connections: Socially Isolated (06/29/2020)   Social Connection and Isolation Panel [NHANES]    Frequency of Communication with Friends and Family: More than three times a week    Frequency of Social Gatherings with Friends and Family: Once a week    Attends Religious Services: Never    Marine scientist or Organizations: No    Attends Archivist Meetings: Never    Marital Status: Widowed  Intimate Partner Violence: Not At Risk (06/29/2020)   Humiliation, Afraid, Rape, and Kick questionnaire    Fear of Current or Ex-Partner: No    Emotionally Abused: No    Physically Abused: No    Sexually Abused: No    Review of Systems: See HPI, otherwise negative ROS  Physical Exam: LMP 02/07/1999 (Approximate)  General:   Alert,  Well-developed,  well-nourished, pleasant and cooperative in NAD Neck:  Supple; no masses or thyromegaly. No significant cervical adenopathy. Lungs:  Clear throughout to auscultation.   No wheezes, crackles, or rhonchi. No acute distress. Heart:  Regular rate and rhythm; no murmurs, clicks, rubs,  or gallops. Abdomen: Non-distended, normal bowel sounds.  Soft and nontender without appreciable mass or hepatosplenomegaly.  Pulses:  Normal pulses noted. Extremities:  Without clubbing or edema.  Impression/Plan: 71 year old with intermittent rectal bleeding constipation positive family history of colon cancer. She is here for diagnostic colonoscopy. The risks, benefits, limitations, alternatives and imponderables have been reviewed with the patient. Questions have been answered. All parties  are agreeable.   Xarelto held 3 days ago per patient report.    Notice: This dictation was prepared with Dragon dictation along with smaller phrase technology. Any transcriptional errors that result from this process are unintentional and may not be corrected upon review.

## 2021-07-25 NOTE — Transfer of Care (Signed)
Immediate Anesthesia Transfer of Care Note  Patient: Crystal Cooke  Procedure(s) Performed: COLONOSCOPY WITH PROPOFOL  Patient Location: Endoscopy Unit  Anesthesia Type:General  Level of Consciousness: awake  Airway & Oxygen Therapy: Patient Spontanous Breathing  Post-op Assessment: Report given to RN  Post vital signs: Reviewed and stable  Last Vitals:  Vitals Value Taken Time  BP 86/55 07/25/21 0917  Temp 36.4 C 07/25/21 0917  Pulse 70 07/25/21 0917  Resp 18 07/25/21 0917  SpO2 97 % 07/25/21 0917    Last Pain:  Vitals:   07/25/21 0917  TempSrc: Oral  PainSc: 0-No pain      Patients Stated Pain Goal: 7 (07/61/51 8343)  Complications: No notable events documented.

## 2021-07-25 NOTE — Anesthesia Postprocedure Evaluation (Signed)
Anesthesia Post Note  Patient: Crystal Cooke  Procedure(s) Performed: COLONOSCOPY WITH PROPOFOL  Patient location during evaluation: Endoscopy Anesthesia Type: General Level of consciousness: awake and alert Pain management: pain level controlled Vital Signs Assessment: post-procedure vital signs reviewed and stable Respiratory status: spontaneous breathing Cardiovascular status: blood pressure returned to baseline and stable Postop Assessment: no apparent nausea or vomiting Anesthetic complications: no   No notable events documented.   Last Vitals:  Vitals:   07/25/21 0838 07/25/21 0917  BP: 109/66 (!) 86/55  Pulse: 87 70  Resp: 20 18  Temp: 36.8 C (!) 36.4 C  SpO2: 100% 97%    Last Pain:  Vitals:   07/25/21 0917  TempSrc: Oral  PainSc: 0-No pain                 Mckale Haffey

## 2021-07-25 NOTE — Op Note (Signed)
[facility] Patient Name: Crystal Cooke Procedure Date: 07/25/2021 8:44 AM MRN: [id number] Date of Birth: [birth year] Attending MD: Norvel Richards , MD CSN: [id number] Age: 71 Admit Type: Outpatient Procedure:                Colonoscopy Indications:              Hematochezia Providers:                Norvel Richards, MD, Caprice Kluver, Raphael Gibney, Technician Referring MD:              Medicines:                Propofol per Anesthesia Complications:            No immediate complications. Estimated Blood Loss:     Estimated blood loss: none. Procedure:                Pre-Anesthesia Assessment:                           - Prior to the procedure, a History and Physical                            was performed, and patient medications and                            allergies were reviewed. The patient's tolerance of                            previous anesthesia was also reviewed. The risks                            and benefits of the procedure and the sedation                            options and risks were discussed with the patient.                            All questions were answered, and informed consent                            was obtained. Prior Anticoagulants: The patient                            last took Xarelto (rivaroxaban) 3 days prior to the                            procedure. ASA Grade Assessment: II - A patient                            with mild systemic disease. After reviewing the                            risks  and benefits, the patient was deemed in                            satisfactory condition to undergo the procedure.                           After obtaining informed consent, the colonoscope                            was passed under direct vision. Throughout the                            procedure, the patient's blood pressure, pulse, and                            oxygen saturations were monitored  continuously. The                            [id number]) scope was introduced through                            the anus and advanced to the the cecum, identified                            by appendiceal orifice and ileocecal valve. The                            colonoscopy was performed without difficulty. The                            patient tolerated the procedure well. The quality                            of the bowel preparation was adequate. Scope In: 9:02:17 AM Scope Out: 9:12:29 AM Scope Withdrawal Time: 0 hours 6 minutes 19 seconds  Total Procedure Duration: 0 hours 10 minutes 12 seconds  Findings:      The perianal and digital rectal examinations were normal.      A few medium-mouthed diverticula were found in the entire colon.      Non-bleeding internal hemorrhoids were found during retroflexion. The       hemorrhoids were mild, small and Grade I (internal hemorrhoids that do       not prolapse).      The exam was otherwise without abnormality on direct and retroflexion       views. Impression:               - Diverticulosis in the entire examined colon.                           - Non-bleeding internal hemorrhoids.                           - The examination was otherwise normal on direct  and retroflexion views.                           - No specimens collected. Moderate Sedation:      Moderate (conscious) sedation was personally administered by an       anesthesia professional. The following parameters were monitored: oxygen       saturation, heart rate, blood pressure, respiratory rate, EKG, adequacy       of pulmonary ventilation, and response to care. Recommendation:           - Patient has a contact number available for                            emergencies. The signs and symptoms of potential                            delayed complications were discussed with the                            patient. Return to normal  activities tomorrow.                            Written discharge instructions were provided to the                            patient.                           - Advance diet as tolerated.                           - Continue present medications.                           - Repeat colonoscopy in 5 years for screening                            purposes.                           - Return to GI office in 3 months. Procedure Code(s):        --- Professional ---                           [phone], Colonoscopy, flexible; diagnostic, including                            collection of specimen(s) by brushing or washing,                            when performed (separate procedure) Diagnosis Code(s):        --- Professional ---                           K64.0, First degree hemorrhoids  K92.1, Melena (includes Hematochezia)                           K57.30, Diverticulosis of large intestine without                            perforation or abscess without bleeding CPT copyright 2019 American Medical Association. All rights reserved. The codes documented in this report are preliminary and upon coder review may  be revised to meet current compliance requirements. Cristopher Estimable. Mylynn Dinh, MD Norvel Richards, MD 07/25/2021 10:49:56 AM This report has been signed electronically. Number of Addenda: 0

## 2021-07-25 NOTE — Discharge Instructions (Addendum)
  Colonoscopy Discharge Instructions  Read the instructions outlined below and refer to this sheet in the next few weeks. These discharge instructions provide you with general information on caring for yourself after you leave the hospital. Your doctor may also give you specific instructions. While your treatment has been planned according to the most current medical practices available, unavoidable complications occasionally occur. If you have any problems or questions after discharge, call Dr. Gala Romney at [phone]. ACTIVITY You may resume your regular activity, but move at a slower pace for the next 24 hours.  Take frequent rest periods for the next 24 hours.  Walking will help get rid of the air and reduce the bloated feeling in your belly (abdomen).  No driving for 24 hours (because of the medicine (anesthesia) used during the test).   Do not sign any important legal documents or operate any machinery for 24 hours (because of the anesthesia used during the test).  NUTRITION Drink plenty of fluids.  You may resume your normal diet as instructed by your doctor.  Begin with a light meal and progress to your normal diet. Heavy or fried foods are harder to digest and may make you feel sick to your stomach (nauseated).  Avoid alcoholic beverages for 24 hours or as instructed.  MEDICATIONS You may resume your normal medications unless your doctor tells you otherwise.  WHAT YOU CAN EXPECT TODAY Some feelings of bloating in the abdomen.  Passage of more gas than usual.  Spotting of blood in your stool or on the toilet paper.  IF YOU HAD POLYPS REMOVED DURING THE COLONOSCOPY: No aspirin products for 7 days or as instructed.  No alcohol for 7 days or as instructed.  Eat a soft diet for the next 24 hours.  FINDING OUT THE RESULTS OF YOUR TEST Not all test results are available during your visit. If your test results are not back during the visit, make an appointment with your caregiver to find out the  results. Do not assume everything is normal if you have not heard from your caregiver or the medical facility. It is important for you to follow up on all of your test results.  SEEK IMMEDIATE MEDICAL ATTENTION IF: You have more than a spotting of blood in your stool.  Your belly is swollen (abdominal distention).  You are nauseated or vomiting.  You have a temperature over 101.  You have abdominal pain or discomfort that is severe or gets worse throughout the day.     Diverticulosis, constipation and hemorrhoid information provided  No polyps found today  I suspect you have blood from hemorrhoids in the setting of constipation.  Begin MiraLAX 1-2 capfuls daily as needed for constipation  Repeat colonoscopy in 5 years due to family history.  Office visit with [place] in 3 to 4 months  At patient request I called a unique butts, granddaughter, at [phone] -unable to make contact.  Message stated "voicemail full"  Resume Xarelto today.

## 2021-07-25 NOTE — Anesthesia Preprocedure Evaluation (Addendum)
Anesthesia Evaluation  Patient identified by MRN, date of birth, ID band Patient awake    Reviewed: Allergy & Precautions, NPO status , Patient's Chart, lab work & pertinent test results  Airway Mallampati: II  TM Distance: >3 FB Neck ROM: Full    Dental  (+) Dental Advisory Given, Partial Upper, Partial Lower   Pulmonary PE   Pulmonary exam normal breath sounds clear to auscultation       Cardiovascular Exercise Tolerance: Good hypertension, Pt. on medications + DVT  Normal cardiovascular exam+ dysrhythmias Atrial Fibrillation  Rhythm:Regular Rate:Normal  28-Apr-2021 11:19:22 [facility] ROUTINE RECORD 23-Jul-1950 (29 yr) Female Black Vent. rate 76 BPM PR interval 176 ms QRS duration 75 ms QT/QTcB 373/420 ms P-R-T axes 73 61 56 Sinus rhythm Abnormal R-wave progression, early transition ST-t wave abnormality Baseline wander Artifact Abnormal ECG Confirmed by Carmin Muskrat [phone]) on 04/28/2021 11:42:28 AM   Neuro/Psych PSYCHIATRIC DISORDERS Anxiety negative neurological ROS     GI/Hepatic negative GI ROS, Neg liver ROS,   Endo/Other  negative endocrine ROS  Renal/GU negative Renal ROS  negative genitourinary   Musculoskeletal  (+) Arthritis ,   Abdominal   Peds negative pediatric ROS (+)  Hematology negative hematology ROS (+)   Anesthesia Other Findings   Reproductive/Obstetrics negative OB ROS                            Anesthesia Physical Anesthesia Plan  ASA: 3  Anesthesia Plan: General   Post-op Pain Management: Minimal or no pain anticipated   Induction: Intravenous  PONV Risk Score and Plan: Propofol infusion  Airway Management Planned: Nasal Cannula and Natural Airway  Additional Equipment:   Intra-op Plan:   Post-operative Plan:   Informed Consent: I have reviewed the patients History and Physical, chart, labs and discussed the  procedure including the risks, benefits and alternatives for the proposed anesthesia with the patient or authorized representative who has indicated his/her understanding and acceptance.     Dental advisory given  Plan Discussed with: CRNA and Surgeon  Anesthesia Plan Comments:        Anesthesia Quick Evaluation

## 2021-07-29 ENCOUNTER — Encounter (HOSPITAL_COMMUNITY): Payer: Self-pay | Admitting: Internal Medicine

## 2021-08-02 DIAGNOSIS — R300 Dysuria: Secondary | ICD-10-CM | POA: Diagnosis not present

## 2021-08-02 DIAGNOSIS — R350 Frequency of micturition: Secondary | ICD-10-CM | POA: Insufficient documentation

## 2021-08-02 DIAGNOSIS — N390 Urinary tract infection, site not specified: Secondary | ICD-10-CM | POA: Diagnosis not present

## 2021-08-12 ENCOUNTER — Other Ambulatory Visit (HOSPITAL_COMMUNITY): Payer: Self-pay | Admitting: Internal Medicine

## 2021-08-12 ENCOUNTER — Other Ambulatory Visit: Payer: Self-pay | Admitting: Internal Medicine

## 2021-08-12 DIAGNOSIS — R3129 Other microscopic hematuria: Secondary | ICD-10-CM | POA: Diagnosis not present

## 2021-08-12 DIAGNOSIS — B370 Candidal stomatitis: Secondary | ICD-10-CM | POA: Diagnosis not present

## 2021-08-12 DIAGNOSIS — N281 Cyst of kidney, acquired: Secondary | ICD-10-CM | POA: Diagnosis not present

## 2021-08-12 DIAGNOSIS — R350 Frequency of micturition: Secondary | ICD-10-CM | POA: Diagnosis not present

## 2021-08-15 ENCOUNTER — Ambulatory Visit: Payer: Medicare HMO | Admitting: Internal Medicine

## 2021-08-16 ENCOUNTER — Other Ambulatory Visit: Payer: Self-pay

## 2021-08-19 ENCOUNTER — Ambulatory Visit (INDEPENDENT_AMBULATORY_CARE_PROVIDER_SITE_OTHER): Payer: Medicare HMO | Admitting: Urology

## 2021-08-19 ENCOUNTER — Encounter: Payer: Self-pay | Admitting: Urology

## 2021-08-19 VITALS — BP 136/69 | HR 80 | Ht 62.0 in | Wt 125.0 lb

## 2021-08-19 DIAGNOSIS — R3129 Other microscopic hematuria: Secondary | ICD-10-CM | POA: Diagnosis not present

## 2021-08-19 DIAGNOSIS — N3281 Overactive bladder: Secondary | ICD-10-CM | POA: Diagnosis not present

## 2021-08-19 LAB — URINALYSIS, ROUTINE W REFLEX MICROSCOPIC
Bilirubin, UA: NEGATIVE
Glucose, UA: NEGATIVE
Leukocytes,UA: NEGATIVE
Nitrite, UA: NEGATIVE
Specific Gravity, UA: 1.02 (ref 1.005–1.030)
Urobilinogen, Ur: 0.2 mg/dL (ref 0.2–1.0)
pH, UA: 5.5 (ref 5.0–7.5)

## 2021-08-19 LAB — MICROSCOPIC EXAMINATION
Renal Epithel, UA: NONE SEEN /hpf
WBC, UA: NONE SEEN /hpf (ref 0–5)

## 2021-08-19 MED ORDER — MIRABEGRON ER 50 MG PO TB24: 50.0000 mg | ORAL_TABLET | Freq: Every day | ORAL | 0 refills | Status: DC

## 2021-08-19 NOTE — Progress Notes (Signed)
post void residual =0mL 

## 2021-08-19 NOTE — Progress Notes (Signed)
Assessment: 1. Microscopic hematuria   2. OAB (overactive bladder)     Plan: I reviewed the patient's records from Dr. Nevada Crane including laboratory results. Today I had a discussion with the patient regarding the findings of microscopic hematuria including the implications and differential diagnoses associated with it.  I also discussed recommendations for further evaluation including the rationale for upper tract imaging and cystoscopy.  I discussed the nature of these procedures including potential risk and complications.  The patient expressed an understanding of these issues. Schedule for CT hematuria protocol and cystoscopy  Diagnosis and management of overactive bladder discussed with the patient.  Options for management including avoidance of dietary irritants, behavioral modification, medical therapy, neuromodulation, and chemodenervation discussed.  Chief Complaint:  Chief Complaint  Patient presents with   Hematuria    History of Present Illness:  Crystal Cooke is a 71 y.o. year old female who is seen in consultation from Celene Squibb, MD for evaluation of hematuria.   Dipstick urinalysis results: 08/02/2021 large blood 08/12/2021 large blood  Urine culture from 08/12/2021: <10K colonies mixed flora  No history of gross hematuria.  No dysuria or flank pain.  No recent UTIs.  No history of kidney stones.  No history of tobacco use. 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 She does have baseline urinary symptoms of frequency, urgency, and nocturia 4-5 times.  She is not having any problems with incontinence.   Past Medical History:  Past Medical History:  Diagnosis Date   Allergy    Anxiety    Atrial fibrillation ([place])    h/o a fib,  NSR with last EKG, has never seen a cardiologist per pt    DDD (degenerative disc disease), lumbar    Hypercholesteremia    Hypertension    Leg DVT (deep venous thromboembolism), acute, left ([place]) 01/09/2018    Past Surgical  History:  Past Surgical History:  Procedure Laterality Date   COLONOSCOPY  05/05/14   Dr.Benson- normal colonoscopy, retroflexed views revealed no abnormalities.    COLONOSCOPY WITH PROPOFOL N/A 07/25/2021   Procedure: COLONOSCOPY WITH PROPOFOL;  Surgeon: Daneil Dolin, MD;  [facility];  Service: Endoscopy;  Laterality: N/A;  9:45am   ESOPHAGOGASTRODUODENOSCOPY  05/05/14   Dr.Benson- normal EGD, retroflexed views revealed no abnormalities    EUS N/A 09/24/2014   Procedure: UPPER ENDOSCOPIC ULTRASOUND (EUS) RADIAL;  Surgeon: Milus Banister, MD;  [facility]: [facility];  Service: Endoscopy;  Laterality: N/A;   KNEE ARTHROSCOPY Right 01/05/2017   Procedure: RIGHT KNEE ARTHROSCOPY, PARTIAL LATERAL MENISCECTOMY,;  Surgeon: Marybelle Killings, MD;  Location: [place];  Service: Orthopedics;  Laterality: Right;   none     PANCREAS SURGERY     partial    Allergies:  Allergies  Allergen Reactions   Levaquin [Levofloxacin] Other (See Comments)    "tendon damage"   Oxycodone Nausea Only    Dizziness and headaches   Statins Other (See Comments)    "takes the taste out of my mouth    Family History:  Family History  Problem Relation Age of Onset   Lung cancer Mother            Hypertension Mother    Cancer Mother    Diabetes Father    Hypertension Father    Lung cancer Father        deceased age 22   Cancer Father    Colon cancer Sister  deceased 2016, age 87   Pancreatic cancer Neg Hx     Social History:  Social History   Tobacco Use   Smoking status: Never   Smokeless tobacco: Never  Vaping Use   Vaping Use: Never used  Substance Use Topics   Alcohol use: No   Drug use: No    Review of symptoms:  Constitutional:  Negative for unexplained weight loss, night sweats, fever, chills ENT:  Negative for nose bleeds, sinus pain, painful swallowing CV:  Negative for chest pain, shortness of breath, exercise intolerance, palpitations, loss of  consciousness Resp:  Negative for cough, wheezing, shortness of breath GI:  Negative for nausea, vomiting, diarrhea, bloody stools GU:  Positives noted in HPI; otherwise negative for gross hematuria, dysuria, urinary incontinence Neuro:  Negative for seizures, poor balance, limb weakness, slurred speech Psych:  Negative for lack of energy, depression, anxiety Endocrine:  Negative for polydipsia, polyuria, symptoms of hypoglycemia (dizziness, hunger, sweating) Hematologic:  Negative for anemia, purpura, petechia, prolonged or excessive bleeding, use of anticoagulants  Allergic:  Negative for difficulty breathing or choking as a result of exposure to anything; no shellfish allergy; no allergic response (rash/itch) to materials, foods  Physical exam: BP 136/69   Pulse 80   Ht '5\' 2"'$  (1.575 m)   Wt 125 lb (56.7 kg)   LMP 02/07/1999 (Approximate)   BMI 22.86 kg/m  GENERAL APPEARANCE:  Well appearing, well developed, well nourished, NAD HEENT: Atraumatic, Normocephalic, oropharynx clear. NECK: Supple without lymphadenopathy or thyromegaly. LUNGS: Clear to auscultation bilaterally. HEART: Regular Rate and Rhythm without murmurs, gallops, or rubs. ABDOMEN: Soft, non-tender, No Masses. EXTREMITIES: Moves all extremities well.  Without clubbing, cyanosis, or edema. NEUROLOGIC:  Alert and oriented x 3, normal gait, CN II-XII grossly intact.  MENTAL STATUS:  Appropriate. BACK:  Non-tender to palpation.  No CVAT SKIN:  Warm, dry and intact.    Results: U/A: 3-10 RBC, hyaline casts, few bacteria   PVR = 0 ml

## 2021-08-25 DIAGNOSIS — N3281 Overactive bladder: Secondary | ICD-10-CM | POA: Diagnosis not present

## 2021-08-25 DIAGNOSIS — R3129 Other microscopic hematuria: Secondary | ICD-10-CM | POA: Diagnosis not present

## 2021-08-25 DIAGNOSIS — R350 Frequency of micturition: Secondary | ICD-10-CM | POA: Diagnosis not present

## 2021-08-25 DIAGNOSIS — B370 Candidal stomatitis: Secondary | ICD-10-CM | POA: Diagnosis not present

## 2021-08-25 DIAGNOSIS — Z9189 Other specified personal risk factors, not elsewhere classified: Secondary | ICD-10-CM | POA: Diagnosis not present

## 2021-08-25 DIAGNOSIS — N281 Cyst of kidney, acquired: Secondary | ICD-10-CM | POA: Diagnosis not present

## 2021-09-06 DIAGNOSIS — B370 Candidal stomatitis: Secondary | ICD-10-CM | POA: Diagnosis not present

## 2021-09-14 ENCOUNTER — Ambulatory Visit (HOSPITAL_COMMUNITY): Payer: Medicare HMO

## 2021-09-14 ENCOUNTER — Encounter (HOSPITAL_COMMUNITY): Payer: Self-pay

## 2021-09-22 ENCOUNTER — Other Ambulatory Visit: Payer: Medicare HMO | Admitting: Urology

## 2021-09-22 NOTE — Progress Notes (Deleted)
Assessment: 1. Microscopic hematuria   2. OAB (overactive bladder)     Plan: I reviewed the patient's records from Dr. Nevada Crane including laboratory results. Today I had a discussion with the patient regarding the findings of microscopic hematuria including the implications and differential diagnoses associated with it.  I also discussed recommendations for further evaluation including the rationale for upper tract imaging and cystoscopy.  I discussed the nature of these procedures including potential risk and complications.  The patient expressed an understanding of these issues. Schedule for CT hematuria protocol and cystoscopy  Diagnosis and management of overactive bladder discussed with the patient.  Options for management including avoidance of dietary irritants, behavioral modification, medical therapy, neuromodulation, and chemodenervation discussed.  Chief Complaint:  No chief complaint on file.   History of Present Illness:  Crystal Cooke is a 71 y.o. year old female who is seen for further evaluation of microscopic hematuria.   Dipstick urinalysis results: 08/02/2021 large blood 08/12/2021 large blood  Urine culture from 08/12/2021: <10K colonies mixed flora U/A 08/19/21:  3-10 RBCs  No history of gross hematuria.  No dysuria or flank pain.  No recent UTIs.  No history of kidney stones.  No history of tobacco use. 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 She does have baseline urinary symptoms of frequency, urgency, and nocturia 4-5 times.  She is not having any problems with incontinence.  Portions of the above documentation were copied from a prior visit for review purposes only.    Past Medical History:  Past Medical History:  Diagnosis Date   Allergy    Anxiety    Atrial fibrillation ([place])    h/o a fib,  NSR with last EKG, has never seen a cardiologist per pt    DDD (degenerative disc disease), lumbar    Hypercholesteremia    Hypertension    Leg DVT (deep  venous thromboembolism), acute, left ([place]) 01/09/2018    Past Surgical History:  Past Surgical History:  Procedure Laterality Date   COLONOSCOPY  05/05/14   Dr.Benson- normal colonoscopy, retroflexed views revealed no abnormalities.    COLONOSCOPY WITH PROPOFOL N/A 07/25/2021   Procedure: COLONOSCOPY WITH PROPOFOL;  Surgeon: Daneil Dolin, MD;  [facility];  Service: Endoscopy;  Laterality: N/A;  9:45am   ESOPHAGOGASTRODUODENOSCOPY  05/05/14   Dr.Benson- normal EGD, retroflexed views revealed no abnormalities    EUS N/A 09/24/2014   Procedure: UPPER ENDOSCOPIC ULTRASOUND (EUS) RADIAL;  Surgeon: Milus Banister, MD;  [facility];  Service: Endoscopy;  Laterality: N/A;   KNEE ARTHROSCOPY Right 01/05/2017   Procedure: RIGHT KNEE ARTHROSCOPY, PARTIAL LATERAL MENISCECTOMY,;  Surgeon: Marybelle Killings, MD;  Location: [place];  Service: Orthopedics;  Laterality: Right;   none     PANCREAS SURGERY     partial    Allergies:  Allergies  Allergen Reactions   Levaquin [Levofloxacin] Other (See Comments)    "tendon damage"   Oxycodone Nausea Only    Dizziness and headaches   Statins Other (See Comments)    "takes the taste out of my mouth    Family History:  Family History  Problem Relation Age of Onset   Lung cancer Mother            Hypertension Mother    Cancer Mother    Diabetes Father    Hypertension Father    Lung cancer Father        deceased age 88   Cancer  Father    Colon cancer Sister        deceased 2014-05-11, age 15   Pancreatic cancer Neg Hx     Social History:  Social History   Tobacco Use   Smoking status: Never   Smokeless tobacco: Never  Vaping Use   Vaping Use: Never used  Substance Use Topics   Alcohol use: No   Drug use: No    ROS: Constitutional:  Negative for fever, chills, weight loss CV: Negative for chest pain, previous MI, hypertension Respiratory:  Negative for shortness of breath, wheezing, sleep apnea,  frequent cough GI:  Negative for nausea, vomiting, bloody stool, GERD  Physical exam: LMP 02/07/1999 (Approximate)  GENERAL APPEARANCE:  Well appearing, well developed, well nourished, NAD HEENT:  Atraumatic, normocephalic, oropharynx clear NECK:  Supple without lymphadenopathy or thyromegaly ABDOMEN:  Soft, non-tender, no masses EXTREMITIES:  Moves all extremities well, without clubbing, cyanosis, or edema NEUROLOGIC:  Alert and oriented x 3, normal gait, CN II-XII grossly intact MENTAL STATUS:  appropriate BACK:  Non-tender to palpation, No CVAT SKIN:  Warm, dry, and intact  Results: U/A:   Procedure:  Flexible Cystourethroscopy  Pre-operative Diagnosis: {cysto diagnosis:26394}  Post-operative Diagnosis: {cysto diagnosis:26394}  Anesthesia:  local with lidocaine jelly  Surgical Narrative:  After appropriate informed consent was obtained, the patient was prepped and draped in the usual sterile fashion in the supine position.  The patient was correctly identified and the proper procedure delineated prior to proceeding.  Sterile lidocaine gel was instilled in the urethra. The flexible cystoscope was introduced without difficulty.  Findings:  Urethra: {anterior urethral findings:26395}  Bladder: {bladder findings:26397}  Ureteral orifices: {Normal/Abnormal Appearance:21344::"normal"}  Additional findings:  Saline bladder wash for cytology {WAS/WAS NOT:[phone]::"was not"} performed.    The cystoscope was then removed.  The patient tolerated the procedure well.

## 2021-10-05 ENCOUNTER — Ambulatory Visit (HOSPITAL_COMMUNITY): Payer: Medicare HMO

## 2021-10-05 ENCOUNTER — Encounter (HOSPITAL_COMMUNITY): Payer: Self-pay

## 2021-10-30 ENCOUNTER — Encounter (HOSPITAL_COMMUNITY): Payer: Self-pay

## 2021-10-30 ENCOUNTER — Emergency Department (HOSPITAL_COMMUNITY)
Admission: EM | Admit: 2021-10-30 | Discharge: 2021-10-30 | Disposition: A | Discharge status: Home / Self Care | Payer: Medicare HMO | Attending: Emergency Medicine | Admitting: Emergency Medicine

## 2021-10-30 ENCOUNTER — Emergency Department (HOSPITAL_COMMUNITY): Payer: Medicare HMO

## 2021-10-30 ENCOUNTER — Other Ambulatory Visit: Payer: Self-pay

## 2021-10-30 DIAGNOSIS — S060X0A Concussion without loss of consciousness, initial encounter: Secondary | ICD-10-CM | POA: Diagnosis not present

## 2021-10-30 DIAGNOSIS — Z7901 Long term (current) use of anticoagulants: Secondary | ICD-10-CM | POA: Insufficient documentation

## 2021-10-30 DIAGNOSIS — W228XXA Striking against or struck by other objects, initial encounter: Secondary | ICD-10-CM | POA: Diagnosis not present

## 2021-10-30 DIAGNOSIS — R519 Headache, unspecified: Secondary | ICD-10-CM | POA: Diagnosis not present

## 2021-10-30 DIAGNOSIS — S0990XA Unspecified injury of head, initial encounter: Secondary | ICD-10-CM | POA: Diagnosis not present

## 2021-10-30 NOTE — ED Provider Notes (Signed)
[facility] EMERGENCY DEPARTMENT Provider Note   CSN: [id number] Arrival date & time: 10/30/21  1012     History Chief Complaint  Patient presents with   Headache    Crystal Cooke is a 71 y.o. female patient who presents to the emergency department today for further evaluation of headache and dizziness been happening since she struck her head on Thursday.  Patient states that she was helping some family move some furniture into a car when she excellently struck her head against the car and fell backwards.  She did not lose consciousness.  Patient is anticoagulated on Xarelto secondary to a pulmonary embolism.  She denies any focal weakness or numbness to her arms or legs.   Headache      Home Medications Prior to Admission medications   Medication Sig Start Date End Date Taking? Authorizing Provider  acetaminophen (TYLENOL) 500 MG tablet Take 1,000 mg by mouth every 6 (six) hours as needed for moderate pain or mild pain.    [provider]  ALPRAZolam Duanne Moron) 0.5 MG tablet Take 0.5 mg by mouth daily as needed for anxiety. 06/28/20   [provider]  Ascorbic Acid (VITAMIN C) 1000 MG tablet Take 1,000 mg by mouth daily.    [provider]  CARTIA XT 300 MG 24 hr capsule Take 1 capsule by mouth once daily in the morning Patient taking differently: Take 300 mg by mouth daily. 05/12/19   Concepcion Living, MD  Cholecalciferol (VITAMIN D) 50 MCG (2000 UT) CAPS Take 2,000 Units by mouth daily.    [provider]  Garlic 5638 MG CAPS Take 1,000 mg by mouth daily.    [provider]  mirabegron ER (MYRBETRIQ) 50 MG TB24 tablet Take 1 tablet (50 mg total) by mouth daily. 08/19/21   Stoneking, Reece Leader., MD  polyethylene glycol-electrolytes (TRILYTE) 420 g solution Take 4,000 mLs by mouth as directed. 06/20/21   Rourk, Cristopher Estimable, MD  potassium chloride SA (KLOR-CON) 20 MEQ tablet Take 1 tablet (20 mEq total) by mouth daily. 10/28/20   Fay Records, MD   rosuvastatin (CRESTOR) 5 MG tablet Take 5 mg by mouth daily. Sometimes doesn't take everyday 11/11/19   [provider]  tiZANidine (ZANAFLEX) 2 MG tablet Take 2 mg by mouth every 6 (six) hours as needed for muscle spasms. 05/27/21   [provider]  traMADol (ULTRAM) 50 MG tablet Take 50 mg by mouth every 8 (eight) hours as needed for severe pain. 01/05/20   [provider]  XARELTO 10 MG TABS tablet Take 10 mg by mouth daily. 08/26/20   [provider]      Allergies    Levaquin [levofloxacin], Oxycodone, and Statins    Review of Systems   Review of Systems  Neurological:  Positive for headaches.  All other systems reviewed and are negative.   Physical Exam Updated Vital Signs BP (!) 142/81 (BP Location: Right Arm)   Pulse 87   Temp 98.5 F (36.9 C) (Oral)   Resp 15   Ht '5\' 2"'$  (1.575 m)   Wt 54.4 kg   LMP 02/07/1999 (Approximate)   SpO2 99%   BMI 21.95 kg/m  Physical Exam Vitals and nursing note reviewed.  Constitutional:      General: She is not in acute distress.    Appearance: Normal appearance.  HENT:     Head: Normocephalic and atraumatic.  Eyes:     General:  Right eye: No discharge.        Left eye: No discharge.  Cardiovascular:     Comments: Regular rate and rhythm.  S1/S2 are distinct without any evidence of murmur, rubs, or gallops.  Radial pulses are 2+ bilaterally.  Dorsalis pedis pulses are 2+ bilaterally.  No evidence of pedal edema. Pulmonary:     Comments: Clear to auscultation bilaterally.  Normal effort.  No respiratory distress.  No evidence of wheezes, rales, or rhonchi heard throughout. Abdominal:     General: Abdomen is flat. Bowel sounds are normal. There is no distension.     Tenderness: There is no abdominal tenderness. There is no guarding or rebound.  Musculoskeletal:        General: Normal range of motion.     Cervical back: Neck supple.  Skin:    General: Skin is warm and dry.     Findings: No  rash.  Neurological:     General: No focal deficit present.     Mental Status: She is alert.     Comments: Cranial nerves II through XII are intact.  5/5 strength to the upper and lower extremities.  Normal sensation to the upper and lower extremities.  No dysmetria to finger-nose.  No pronator drift.  Psychiatric:        Mood and Affect: Mood normal.        Behavior: Behavior normal.     ED Results / Procedures / Treatments   Labs (all labs ordered are listed, but only abnormal results are displayed) Labs Reviewed - No data to display  EKG None  Radiology CT Head Wo Contrast  Result Date: 10/30/2021 CLINICAL DATA:  71 year old female with continued headache following fall 3 days ago. Initial encounter. EXAM: CT HEAD WITHOUT CONTRAST TECHNIQUE: Contiguous axial images were obtained from the base of the skull through the vertex without intravenous contrast. RADIATION DOSE REDUCTION: This exam was performed according to the departmental dose-optimization program which includes automated exposure control, adjustment of the mA and/or kV according to patient size and/or use of iterative reconstruction technique. COMPARISON:  01/19/2007 CT FINDINGS: Brain: No evidence of acute infarction, hemorrhage, hydrocephalus, extra-axial collection or mass lesion/mass effect. Vascular: No hyperdense vessel or unexpected calcification. Skull: Normal. Negative for fracture or focal lesion. Sinuses/Orbits: No acute finding. Other: None. IMPRESSION: Unremarkable noncontrast head CT. Electronically Signed   By: Margarette Canada M.D.   On: 10/30/2021 12:59    Procedures Procedures    Medications Ordered in ED Medications - No data to display  ED Course/ Medical Decision Making/ A&P Clinical Course as of 10/30/21 1321  Sun Oct 30, 2021  1319 CT Head Wo Contrast I personally ordered and interpreted the study.  I do not see any evidence of intracranial hemorrhage.  I do agree with radiologist interpretation.  [CF]    Clinical Course User Index [CF] Hendricks Limes, PA-C                           Medical Decision Making Crystal Cooke is a 71 y.o. female patient who presents to the emergency department today for further evaluation of headache and blurred vision.  Given that the patient is anticoagulated on Xarelto and she had a head injury I will evaluate the CT head without contrast to look for any intracranial hemorrhage.  Patient's neurological exam is completely normal here and I have a low suspicion at this time for any stroke.  I  will plan to reassess once imaging results.  CT head did not reveal any signs of intracranial hemorrhage.  I will discharge her home with plans to follow-up with her primary care doctor.  I educated her on concussion-like symptoms.  Seems to be a similar presentation.  Strict return precautions were discussed.  She is safe for discharge.  Amount and/or Complexity of Data Reviewed Radiology: ordered. Decision-making details documented in ED Course.   Final Clinical Impression(s) / ED Diagnoses Final diagnoses:  Concussion without loss of consciousness, initial encounter    Rx / DC Orders ED Discharge Orders     None         Cherrie Gauze 10/30/21 1321    Davonna Belling, MD 10/30/21 1447

## 2021-10-30 NOTE — Discharge Instructions (Signed)
Your CT head without contrast today was normal.  As we discussed, this could be a concussion.  This will get better with time.  You may take Tylenol for pain.  Do not take ibuprofen as you take Xarelto for blood thinning properties.  Follow-up with your primary care doctor for further evaluation.  Return to the emergency department for any worsening symptoms.

## 2021-10-30 NOTE — ED Triage Notes (Signed)
Pt fell Thurs and hit her head and she did not get seen. Now pt has headaches, and dizziness. No n/v noted.

## 2021-11-02 ENCOUNTER — Ambulatory Visit: Payer: Medicare HMO | Admitting: Gastroenterology

## 2021-11-09 ENCOUNTER — Ambulatory Visit: Payer: Medicare HMO | Admitting: Gastroenterology

## 2021-11-10 DIAGNOSIS — M545 Low back pain, unspecified: Secondary | ICD-10-CM | POA: Diagnosis not present

## 2021-11-10 DIAGNOSIS — I10 Essential (primary) hypertension: Secondary | ICD-10-CM | POA: Diagnosis not present

## 2021-11-10 DIAGNOSIS — G609 Hereditary and idiopathic neuropathy, unspecified: Secondary | ICD-10-CM | POA: Diagnosis not present

## 2021-11-10 DIAGNOSIS — R7303 Prediabetes: Secondary | ICD-10-CM | POA: Diagnosis not present

## 2021-11-10 DIAGNOSIS — K8689 Other specified diseases of pancreas: Secondary | ICD-10-CM | POA: Diagnosis not present

## 2021-11-10 DIAGNOSIS — J309 Allergic rhinitis, unspecified: Secondary | ICD-10-CM | POA: Diagnosis not present

## 2021-11-10 DIAGNOSIS — F411 Generalized anxiety disorder: Secondary | ICD-10-CM | POA: Insufficient documentation

## 2021-11-10 DIAGNOSIS — M25562 Pain in left knee: Secondary | ICD-10-CM | POA: Diagnosis not present

## 2021-11-10 DIAGNOSIS — Z86711 Personal history of pulmonary embolism: Secondary | ICD-10-CM | POA: Diagnosis not present

## 2021-11-10 DIAGNOSIS — Z23 Encounter for immunization: Secondary | ICD-10-CM | POA: Diagnosis not present

## 2021-11-10 DIAGNOSIS — Z96652 Presence of left artificial knee joint: Secondary | ICD-10-CM | POA: Diagnosis not present

## 2021-11-10 DIAGNOSIS — R69 Illness, unspecified: Secondary | ICD-10-CM | POA: Diagnosis not present

## 2021-11-10 DIAGNOSIS — E782 Mixed hyperlipidemia: Secondary | ICD-10-CM | POA: Diagnosis not present

## 2021-11-22 DIAGNOSIS — Z90411 Acquired partial absence of pancreas: Secondary | ICD-10-CM | POA: Diagnosis not present

## 2021-11-22 DIAGNOSIS — K8689 Other specified diseases of pancreas: Secondary | ICD-10-CM | POA: Diagnosis not present

## 2021-11-22 DIAGNOSIS — K862 Cyst of pancreas: Secondary | ICD-10-CM | POA: Diagnosis not present

## 2021-11-29 ENCOUNTER — Ambulatory Visit: Payer: Medicare HMO | Admitting: Internal Medicine

## 2021-11-29 ENCOUNTER — Encounter: Payer: Self-pay | Admitting: Internal Medicine

## 2021-12-23 ENCOUNTER — Ambulatory Visit: Payer: Medicare HMO | Admitting: Internal Medicine

## 2022-01-02 DIAGNOSIS — H524 Presbyopia: Secondary | ICD-10-CM | POA: Diagnosis not present

## 2022-01-09 DIAGNOSIS — R69 Illness, unspecified: Secondary | ICD-10-CM | POA: Diagnosis not present

## 2022-01-09 DIAGNOSIS — J309 Allergic rhinitis, unspecified: Secondary | ICD-10-CM | POA: Diagnosis not present

## 2022-01-09 DIAGNOSIS — K8689 Other specified diseases of pancreas: Secondary | ICD-10-CM | POA: Diagnosis not present

## 2022-01-09 DIAGNOSIS — M25562 Pain in left knee: Secondary | ICD-10-CM | POA: Diagnosis not present

## 2022-01-09 DIAGNOSIS — M545 Low back pain, unspecified: Secondary | ICD-10-CM | POA: Diagnosis not present

## 2022-01-09 DIAGNOSIS — R809 Proteinuria, unspecified: Secondary | ICD-10-CM | POA: Diagnosis not present

## 2022-01-09 DIAGNOSIS — Z86711 Personal history of pulmonary embolism: Secondary | ICD-10-CM | POA: Diagnosis not present

## 2022-01-09 DIAGNOSIS — R7303 Prediabetes: Secondary | ICD-10-CM | POA: Diagnosis not present

## 2022-01-09 DIAGNOSIS — G609 Hereditary and idiopathic neuropathy, unspecified: Secondary | ICD-10-CM | POA: Diagnosis not present

## 2022-01-09 DIAGNOSIS — I10 Essential (primary) hypertension: Secondary | ICD-10-CM | POA: Diagnosis not present

## 2022-01-09 DIAGNOSIS — E782 Mixed hyperlipidemia: Secondary | ICD-10-CM | POA: Diagnosis not present

## 2022-01-09 DIAGNOSIS — Z96652 Presence of left artificial knee joint: Secondary | ICD-10-CM | POA: Diagnosis not present

## 2022-02-07 DIAGNOSIS — N390 Urinary tract infection, site not specified: Secondary | ICD-10-CM | POA: Diagnosis not present

## 2022-02-13 NOTE — Progress Notes (Unsigned)
Cardiology Office Note   Date:  02/14/2022   ID:  Crystal, Cooke 1950/12/18, MRN [id number]  PCP:  Celene Squibb, MD  Cardiologist:   Dorris Carnes, MD   Pt presents for follow up of chest pain and afib        History of Present Illness: Crystal Cooke is a 72 y.o. female with a history of atrial fibrillation, atypical CP HTN, HL, PE In Sept 2021 abdominal CT showed RLL PE and LLLL PE)  Initially on Eliquis; switched to Xarelto   Pt follows in hematology    I saw the pt in 2022  CP was atypical (sharp, when lying down, some arm/hand tingling, pleuritic, positional at times)  Since seen the pt was seen in the ED for CP with exertional SOB (March 2023) Pains were sharp   She was sent home   Since seen she notes occsaional sharp left sided pains   Not associated with activity   Pleuritic      No pressure with activity    She denies SOB  No palpitaitons   She is not taking Crestor because it makes her feet hurt  Diet  Egg whites, water ,  Lunch   PB sandwich    Dinner  Chicken salad  Brocolli   Current Meds  Medication Sig   acetaminophen (TYLENOL) 500 MG tablet Take 1,000 mg by mouth every 6 (six) hours as needed for moderate pain or mild pain.   ALPRAZolam (XANAX) 0.5 MG tablet Take 0.5 mg by mouth daily as needed for anxiety.   Ascorbic Acid (VITAMIN C) 1000 MG tablet Take 1,000 mg by mouth daily.   CARTIA XT 300 MG 24 hr capsule Take 1 capsule by mouth once daily in the morning (Patient taking differently: Take 300 mg by mouth daily.)   Cholecalciferol (VITAMIN D) 50 MCG (2000 UT) CAPS Take 2,000 Units by mouth daily.   Garlic 4628 MG CAPS Take 1,000 mg by mouth daily.   polyethylene glycol-electrolytes (TRILYTE) 420 g solution Take 4,000 mLs by mouth as directed.   potassium chloride SA (KLOR-CON) 20 MEQ tablet Take 1 tablet (20 mEq total) by mouth daily.   rosuvastatin (CRESTOR) 5 MG tablet Take 5 mg by mouth daily. Sometimes doesn't take everyday   traMADol (ULTRAM)  50 MG tablet Take 50 mg by mouth every 8 (eight) hours as needed for severe pain.   XARELTO 10 MG TABS tablet Take 10 mg by mouth daily.     Allergies:   Levaquin [levofloxacin], Oxycodone, and Statins   Past Medical History:  Diagnosis Date   Allergy    Anxiety    Atrial fibrillation ([place])    h/o a fib,  NSR with last EKG, has never seen a cardiologist per pt    DDD (degenerative disc disease), lumbar    Hypercholesteremia    Hypertension    Leg DVT (deep venous thromboembolism), acute, left ([place]) 01/09/2018    Past Surgical History:  Procedure Laterality Date   COLONOSCOPY  05/05/14   Dr.Benson- normal colonoscopy, retroflexed views revealed no abnormalities.    COLONOSCOPY WITH PROPOFOL N/A 07/25/2021   Procedure: COLONOSCOPY WITH PROPOFOL;  Surgeon: Daneil Dolin, MD;  [facility];  Service: Endoscopy;  Laterality: N/A;  9:45am   ESOPHAGOGASTRODUODENOSCOPY  05/05/14   Dr.Benson- normal EGD, retroflexed views revealed no abnormalities    EUS N/A 09/24/2014   Procedure: UPPER ENDOSCOPIC ULTRASOUND (EUS) RADIAL;  Surgeon: Milus Banister, MD;  [facility];  Service: Endoscopy;  Laterality: N/A;   KNEE ARTHROSCOPY Right 01/05/2017   Procedure: RIGHT KNEE ARTHROSCOPY, PARTIAL LATERAL MENISCECTOMY,;  Surgeon: Marybelle Killings, MD;  Location: [facility];  Service: Orthopedics;  Laterality: Right;   none     PANCREAS SURGERY     partial     Social History:  The patient  reports that she has never smoked. She has never used smokeless tobacco. She reports that she does not drink alcohol and does not use drugs.   Family History:  The patient's family history includes Cancer in her father and mother; Colon cancer in her sister; Diabetes in her father; Hypertension in her father and mother; Lung cancer in her father and mother.    ROS:  Please see the history of present illness. All other systems are reviewed and  Negative to the above problem except  as noted.    PHYSICAL EXAM: VS:  BP 114/70   Pulse 75   Ht '5\' 1"'$  (1.549 m)   Wt 125 lb 12.8 oz (57.1 kg)   LMP 02/07/1999 (Approximate)   SpO2 95%   BMI 23.77 kg/m   GEN: Well nourished, well developed, in no acute distress  HEENT: normal  Neck: no JVD, carotid brui   Cardiac: RRR; no murmurs,  No LE  edema  Respiratory:  clear to auscultation bilaterally GI: soft, nontender, nondistended, + BS  No hepatomegaly  MS: no deformity Moving all extremities   Skin: warm and dry, no rash Neuro:  Strength and sensation are intact Psych: euthymic mood, full affect   EKG:  EKG is ordered today.  NSR 75 bpm    Lipid Panel    Component Value Date/Time   CHOL 204 (H) 10/25/2020 0853   CHOL 245 (H) 03/12/2017 1410   TRIG 40 10/25/2020 0853   HDL 73 10/25/2020 0853   HDL 66 03/12/2017 1410   CHOLHDL 2.8 10/25/2020 0853   VLDL 17 04/28/2016 1428   LDLCALC 119 (H) 10/25/2020 0853      Wt Readings from Last 3 Encounters:  02/14/22 125 lb 12.8 oz (57.1 kg)  10/30/21 120 lb (54.4 kg)  08/19/21 125 lb (56.7 kg)      ASSESSMENT AND PLAN:  1  Chest pain  CP is atypical   I would follow    2  Hx PE   Long term anticoagulation since recurrent,unprovoked   Get CBC   3  HTN   ON Cartia    BP is controlled     5  Atherosclerosis   CT in 2016 showed abdomal aorta with plaquing    She quit crestor   Switch to lipitor   6   HL  Switch to lipitor as she had aching feet no Crestor    FOllow up in 2 months with labs     6   K   Refill Rx  Check in 8 months   7  Metabolic   O1Y  Discussed diet   CUt back on carbs  Recheck A1C in 2 months   Check:   CBC, CMET, lipomed, A1C, Vit D in 2 months CK  F/U in October    Sooner for problems   Current medicines are reviewed at length with the patient today.  The patient does not have concerns regarding medicines.  Signed, Dorris Carnes, MD  02/14/2022 10:20 AM    [place], [place]  [address] Phone: [phone]; Fax: [phone]

## 2022-02-14 ENCOUNTER — Ambulatory Visit: Payer: Medicare HMO | Attending: Internal Medicine | Admitting: Internal Medicine

## 2022-02-14 ENCOUNTER — Encounter: Payer: Self-pay | Admitting: Internal Medicine

## 2022-02-14 VITALS — BP 114/70 | HR 75 | Ht 61.0 in | Wt 125.8 lb

## 2022-02-14 DIAGNOSIS — E559 Vitamin D deficiency, unspecified: Secondary | ICD-10-CM

## 2022-02-14 DIAGNOSIS — Z79899 Other long term (current) drug therapy: Secondary | ICD-10-CM

## 2022-02-14 DIAGNOSIS — I10 Essential (primary) hypertension: Secondary | ICD-10-CM

## 2022-02-14 DIAGNOSIS — E785 Hyperlipidemia, unspecified: Secondary | ICD-10-CM | POA: Diagnosis not present

## 2022-02-14 MED ORDER — POTASSIUM CHLORIDE CRYS ER 20 MEQ PO TBCR: 20.0000 meq | EXTENDED_RELEASE_TABLET | Freq: Every day | ORAL | 3 refills | Status: DC

## 2022-02-14 MED ORDER — ATORVASTATIN CALCIUM 20 MG PO TABS: 20.0000 mg | ORAL_TABLET | Freq: Every day | ORAL | 3 refills | Status: DC

## 2022-02-14 NOTE — Patient Instructions (Signed)
Medication Instructions:  Your physician has recommended you make the following change in your medication:   Stop Taking Crestor  Start Taking Lipitor 20 mg   *If you need a refill on your cardiac medications before your next appointment, please call your pharmacy*   Lab Work: Your physician recommends that you return for lab work in: 2 Months ( 04/14/22)   If you have labs (blood work) drawn today and your tests are completely normal, you will receive your results only by: [place] (if you have MyChart) OR A paper copy in the mail If you have any lab test that is abnormal or we need to change your treatment, we will call you to review the results.   Testing/Procedures: NONE    Follow-Up: At [facility], you and your health needs are our priority.  As part of our continuing mission to provide you with exceptional heart care, we have created designated Provider Care Teams.  These Care Teams include your primary Cardiologist (physician) and Advanced Practice Providers (APPs -  Physician Assistants and Nurse Practitioners) who all work together to provide you with the care you need, when you need it.  We recommend signing up for the patient portal called "MyChart".  Sign up information is provided on this After Visit Summary.  MyChart is used to connect with patients for Virtual Visits (Telemedicine).  Patients are able to view lab/test results, encounter notes, upcoming appointments, etc.  Non-urgent messages can be sent to your provider as well.   To learn more about what you can do with MyChart, go to [email or web address].    Your next appointment:    October   The format for your next appointment:   In Person  Provider:   Dorris Carnes, MD    Other Instructions Thank you for choosing [facility]!    Important Information About Sugar

## 2022-02-28 DIAGNOSIS — N390 Urinary tract infection, site not specified: Secondary | ICD-10-CM | POA: Diagnosis not present

## 2022-02-28 DIAGNOSIS — E559 Vitamin D deficiency, unspecified: Secondary | ICD-10-CM | POA: Insufficient documentation

## 2022-02-28 DIAGNOSIS — I10 Essential (primary) hypertension: Secondary | ICD-10-CM | POA: Diagnosis not present

## 2022-02-28 DIAGNOSIS — E782 Mixed hyperlipidemia: Secondary | ICD-10-CM | POA: Diagnosis not present

## 2022-03-14 DIAGNOSIS — N390 Urinary tract infection, site not specified: Secondary | ICD-10-CM | POA: Diagnosis not present

## 2022-03-20 ENCOUNTER — Ambulatory Visit: Payer: Medicare HMO | Admitting: Internal Medicine

## 2022-03-30 ENCOUNTER — Ambulatory Visit: Payer: Medicare HMO | Admitting: Urology

## 2022-04-06 ENCOUNTER — Encounter: Payer: Self-pay | Admitting: Radiology

## 2022-04-27 ENCOUNTER — Ambulatory Visit: Payer: Medicare HMO | Admitting: Urology

## 2022-04-27 NOTE — Progress Notes (Deleted)
Subjective:  No diagnosis found.   04/27/22: Crystal Cooke is a 72 yo female who saw Dr. Felipa Eth in 7/23 for microhematuria.  She returns today for recurrent UTI's.  She never had the CT hematuria study or cystoscopy that was ordered at that visit.     ROS:  ROS:  A complete review of systems was performed.  All systems are negative except for pertinent findings as noted.   ROS  Allergies  Allergen Reactions   Levaquin [Levofloxacin] Other (See Comments)    "tendon damage"   Oxycodone Nausea Only    Dizziness and headaches   Statins Other (See Comments)    "takes the taste out of my mouth    Outpatient Encounter Medications as of 04/27/2022  Medication Sig   acetaminophen (TYLENOL) 500 MG tablet Take 1,000 mg by mouth every 6 (six) hours as needed for moderate pain or mild pain.   ALPRAZolam (XANAX) 0.5 MG tablet Take 0.5 mg by mouth daily as needed for anxiety.   Ascorbic Acid (VITAMIN C) 1000 MG tablet Take 1,000 mg by mouth daily.   atorvastatin (LIPITOR) 20 MG tablet Take 1 tablet (20 mg total) by mouth daily.   CARTIA XT 300 MG 24 hr capsule Take 1 capsule by mouth once daily in the morning (Patient taking differently: Take 300 mg by mouth daily.)   Cholecalciferol (VITAMIN D) 50 MCG (2000 UT) CAPS Take 2,000 Units by mouth daily.   Garlic 123XX123 MG CAPS Take 1,000 mg by mouth daily.   mirabegron ER (MYRBETRIQ) 50 MG TB24 tablet Take 1 tablet (50 mg total) by mouth daily. (Patient not taking: Reported on 02/14/2022)   polyethylene glycol-electrolytes (TRILYTE) 420 g solution Take 4,000 mLs by mouth as directed.   potassium chloride SA (KLOR-CON M) 20 MEQ tablet Take 1 tablet (20 mEq total) by mouth daily.   tiZANidine (ZANAFLEX) 2 MG tablet Take 2 mg by mouth every 6 (six) hours as needed for muscle spasms. (Patient not taking: Reported on 02/14/2022)   traMADol (ULTRAM) 50 MG tablet Take 50 mg by mouth every 8 (eight) hours as needed for severe pain.   XARELTO 10 MG TABS tablet Take  10 mg by mouth daily.   No facility-administered encounter medications on file as of 04/27/2022.    Past Medical History:  Diagnosis Date   Allergy    Anxiety    Atrial fibrillation ([place])    h/o a fib,  NSR with last EKG, has never seen a cardiologist per pt    DDD (degenerative disc disease), lumbar    Hypercholesteremia    Hypertension    Leg DVT (deep venous thromboembolism), acute, left ([place]) 01/09/2018    Past Surgical History:  Procedure Laterality Date   COLONOSCOPY  05/05/14   Dr.Benson- normal colonoscopy, retroflexed views revealed no abnormalities.    COLONOSCOPY WITH PROPOFOL N/A 07/25/2021   Procedure: COLONOSCOPY WITH PROPOFOL;  Surgeon: Daneil Dolin, MD;  [facility];  Service: Endoscopy;  Laterality: N/A;  9:45am   ESOPHAGOGASTRODUODENOSCOPY  05/05/14   Dr.Benson- normal EGD, retroflexed views revealed no abnormalities    EUS N/A 09/24/2014   Procedure: UPPER ENDOSCOPIC ULTRASOUND (EUS) RADIAL;  Surgeon: Milus Banister, MD;  [facility];  Service: Endoscopy;  Laterality: N/A;   KNEE ARTHROSCOPY Right 01/05/2017   Procedure: RIGHT KNEE ARTHROSCOPY, PARTIAL LATERAL MENISCECTOMY,;  Surgeon: Marybelle Killings, MD;  Location: [facility];  Service: Orthopedics;  Laterality: Right;   none  PANCREAS SURGERY     partial    Social History   Socioeconomic History   Marital status: Widowed    Spouse name: Not on file   Number of children: 5   Years of education: 60   Highest education level: Not on file  Occupational History   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  Tobacco Use   Smoking status: Never   Smokeless tobacco: Never  Vaping Use   Vaping Use: Never used  Substance and Sexual Activity   Alcohol use: No   Drug use: No   Sexual activity: Yes    Birth control/protection: None, Post-menopausal  Other Topics Concern   Not on file  Social History Narrative   Lives alone   [place] for exercise   Social  Determinants of Health   Financial Resource Strain: Medium Risk (06/29/2020)   Overall Financial Resource Strain (CARDIA)    Difficulty of Paying Living Expenses: Somewhat hard  Food Insecurity: No Food Insecurity (06/29/2020)   Hunger Vital Sign    Worried About Running Out of Food in the Last Year: Never true    Ran Out of Food in the Last Year: Never true  Transportation Needs: No Transportation Needs (06/29/2020)   PRAPARE - Hydrologist (Medical): No    Lack of Transportation (Non-Medical): No  Physical Activity: Inactive (06/29/2020)   Exercise Vital Sign    Days of Exercise per Week: 0 days    Minutes of Exercise per Session: 0 min  Stress: Stress Concern Present (06/29/2020)   [place]    Feeling of Stress : To some extent  Social Connections: Socially Isolated (06/29/2020)   Social Connection and Isolation Panel [NHANES]    Frequency of Communication with Friends and Family: More than three times a week    Frequency of Social Gatherings with Friends and Family: Once a week    Attends Religious Services: Never    Marine scientist or Organizations: No    Attends Archivist Meetings: Never    Marital Status: Widowed  Intimate Partner Violence: Not At Risk (06/29/2020)   Humiliation, Afraid, Rape, and Kick questionnaire    Fear of Current or Ex-Partner: No    Emotionally Abused: No    Physically Abused: No    Sexually Abused: No    Family History  Problem Relation Age of Onset   Lung cancer Mother            Hypertension Mother    Cancer Mother    Diabetes Father    Hypertension Father    Lung cancer Father        deceased age 1   Cancer Father    Colon cancer Sister        deceased 05-14-14, age 20   Pancreatic cancer Neg Hx        Objective: There were no vitals filed for this visit.   Physical Exam  Lab Results:  PSA No results found for:  "PSA" No results found for: "TESTOSTERONE"    Studies/Results: No results found. No results found for this or any previous visit.  No results found for this or any previous visit.  No results found for this or any previous visit.  No results found for this or any previous visit.  No results found for this or any previous visit.  No valid procedures specified. No results found for this or  any previous visit.  No results found for this or any previous visit.    Assessment & Plan: No problem-specific Assessment & Plan notes found for this encounter.    No orders of the defined types were placed in this encounter.    No orders of the defined types were placed in this encounter.     No follow-ups on file.   CC: Celene Squibb, MD      Irine Seal 04/27/2022

## 2022-05-08 DIAGNOSIS — R7303 Prediabetes: Secondary | ICD-10-CM | POA: Diagnosis not present

## 2022-05-08 DIAGNOSIS — E559 Vitamin D deficiency, unspecified: Secondary | ICD-10-CM | POA: Diagnosis not present

## 2022-05-08 DIAGNOSIS — E782 Mixed hyperlipidemia: Secondary | ICD-10-CM | POA: Diagnosis not present

## 2022-05-09 LAB — LAB REPORT - SCANNED
A1c: 6.1
Albumin, Urine POC: 35.1
Creatinine, POC: 99.3 mg/dL
EGFR: 68
Microalb Creat Ratio: 35

## 2022-05-12 DIAGNOSIS — Z0000 Encounter for general adult medical examination without abnormal findings: Secondary | ICD-10-CM | POA: Diagnosis not present

## 2022-05-15 DIAGNOSIS — R809 Proteinuria, unspecified: Secondary | ICD-10-CM | POA: Diagnosis not present

## 2022-05-15 DIAGNOSIS — G609 Hereditary and idiopathic neuropathy, unspecified: Secondary | ICD-10-CM | POA: Diagnosis not present

## 2022-05-15 DIAGNOSIS — K8689 Other specified diseases of pancreas: Secondary | ICD-10-CM | POA: Diagnosis not present

## 2022-05-15 DIAGNOSIS — R7303 Prediabetes: Secondary | ICD-10-CM | POA: Diagnosis not present

## 2022-05-15 DIAGNOSIS — M25562 Pain in left knee: Secondary | ICD-10-CM | POA: Diagnosis not present

## 2022-05-15 DIAGNOSIS — M545 Low back pain, unspecified: Secondary | ICD-10-CM | POA: Diagnosis not present

## 2022-05-15 DIAGNOSIS — E782 Mixed hyperlipidemia: Secondary | ICD-10-CM | POA: Diagnosis not present

## 2022-05-15 DIAGNOSIS — R69 Illness, unspecified: Secondary | ICD-10-CM | POA: Diagnosis not present

## 2022-05-15 DIAGNOSIS — N289 Disorder of kidney and ureter, unspecified: Secondary | ICD-10-CM | POA: Diagnosis not present

## 2022-05-15 DIAGNOSIS — J309 Allergic rhinitis, unspecified: Secondary | ICD-10-CM | POA: Diagnosis not present

## 2022-05-15 DIAGNOSIS — I10 Essential (primary) hypertension: Secondary | ICD-10-CM | POA: Diagnosis not present

## 2022-05-15 DIAGNOSIS — E876 Hypokalemia: Secondary | ICD-10-CM | POA: Diagnosis not present

## 2022-05-18 ENCOUNTER — Telehealth: Payer: Self-pay

## 2022-05-18 DIAGNOSIS — I10 Essential (primary) hypertension: Secondary | ICD-10-CM

## 2022-05-18 DIAGNOSIS — Z79899 Other long term (current) drug therapy: Secondary | ICD-10-CM

## 2022-05-18 DIAGNOSIS — E785 Hyperlipidemia, unspecified: Secondary | ICD-10-CM

## 2022-05-18 MED ORDER — ATORVASTATIN CALCIUM 20 MG PO TABS: 20.0000 mg | ORAL_TABLET | Freq: Every day | ORAL | 3 refills | Status: DC

## 2022-05-18 NOTE — Telephone Encounter (Signed)
Left a message for the pt to call back and sent her a My Chart message .  

## 2022-05-18 NOTE — Telephone Encounter (Signed)
Pt says she has not been taking her Lipitor 20 mg and she will start back and have labs drawn 07/19/22.

## 2022-05-18 NOTE — Telephone Encounter (Signed)
-----   Message from Pricilla Riffle, MD sent at 05/18/2022  1:02 PM EDT ----- LDL is 91    Is she taking lipitor   ? Dose    If she is taking then I would add Zetia to regimen    Pt has atherosclerosis   Goal for LDL 70     Follow up lipomed and Liver panel in 8 wks

## 2022-05-18 NOTE — Addendum Note (Signed)
Addended by: Bertram Millard on: 05/18/2022 01:50 PM   Modules accepted: Orders

## 2022-06-06 DIAGNOSIS — Z0100 Encounter for examination of eyes and vision without abnormal findings: Secondary | ICD-10-CM | POA: Diagnosis not present

## 2022-07-19 ENCOUNTER — Ambulatory Visit: Payer: Medicare HMO

## 2022-07-24 ENCOUNTER — Ambulatory Visit: Payer: Medicare HMO

## 2022-07-25 ENCOUNTER — Ambulatory Visit: Payer: Medicare HMO

## 2022-08-01 ENCOUNTER — Ambulatory Visit: Payer: Medicare HMO | Attending: Internal Medicine

## 2022-08-15 DIAGNOSIS — M25561 Pain in right knee: Secondary | ICD-10-CM | POA: Diagnosis not present

## 2022-08-15 DIAGNOSIS — M25562 Pain in left knee: Secondary | ICD-10-CM | POA: Diagnosis not present

## 2022-08-17 ENCOUNTER — Other Ambulatory Visit (HOSPITAL_COMMUNITY): Payer: Self-pay | Admitting: Orthopedic Surgery

## 2022-08-17 DIAGNOSIS — T84033A Mechanical loosening of internal left knee prosthetic joint, initial encounter: Secondary | ICD-10-CM

## 2022-08-22 ENCOUNTER — Encounter (HOSPITAL_COMMUNITY): Payer: Medicare HMO

## 2022-08-29 ENCOUNTER — Encounter (HOSPITAL_COMMUNITY): Payer: Medicare HMO

## 2022-09-06 ENCOUNTER — Encounter (HOSPITAL_COMMUNITY): Admission: RE | Admit: 2022-09-06 | Payer: Medicare HMO | Source: Ambulatory Visit

## 2022-09-06 ENCOUNTER — Encounter (HOSPITAL_COMMUNITY): Payer: Medicare HMO

## 2022-09-08 DIAGNOSIS — R7303 Prediabetes: Secondary | ICD-10-CM | POA: Diagnosis not present

## 2022-09-08 DIAGNOSIS — E782 Mixed hyperlipidemia: Secondary | ICD-10-CM | POA: Diagnosis not present

## 2022-09-08 DIAGNOSIS — E559 Vitamin D deficiency, unspecified: Secondary | ICD-10-CM | POA: Diagnosis not present

## 2022-09-09 LAB — LAB REPORT - SCANNED
A1c: 5.9
Albumin, Urine POC: 12.8
Creatinine, POC: 132.2 mg/dL
EGFR: 43
Microalb Creat Ratio: 10

## 2022-09-19 ENCOUNTER — Encounter (HOSPITAL_COMMUNITY): Payer: Medicare HMO

## 2022-09-22 DIAGNOSIS — R7303 Prediabetes: Secondary | ICD-10-CM | POA: Diagnosis not present

## 2022-09-22 DIAGNOSIS — Z96652 Presence of left artificial knee joint: Secondary | ICD-10-CM | POA: Diagnosis not present

## 2022-09-22 DIAGNOSIS — M25562 Pain in left knee: Secondary | ICD-10-CM | POA: Diagnosis not present

## 2022-09-22 DIAGNOSIS — R809 Proteinuria, unspecified: Secondary | ICD-10-CM | POA: Diagnosis not present

## 2022-09-22 DIAGNOSIS — G609 Hereditary and idiopathic neuropathy, unspecified: Secondary | ICD-10-CM | POA: Diagnosis not present

## 2022-09-22 DIAGNOSIS — F411 Generalized anxiety disorder: Secondary | ICD-10-CM | POA: Diagnosis not present

## 2022-09-22 DIAGNOSIS — I10 Essential (primary) hypertension: Secondary | ICD-10-CM | POA: Diagnosis not present

## 2022-09-22 DIAGNOSIS — E782 Mixed hyperlipidemia: Secondary | ICD-10-CM | POA: Diagnosis not present

## 2022-09-22 DIAGNOSIS — G8929 Other chronic pain: Secondary | ICD-10-CM | POA: Diagnosis not present

## 2022-09-22 DIAGNOSIS — Z86711 Personal history of pulmonary embolism: Secondary | ICD-10-CM | POA: Diagnosis not present

## 2022-09-22 DIAGNOSIS — M545 Low back pain, unspecified: Secondary | ICD-10-CM | POA: Diagnosis not present

## 2022-09-22 DIAGNOSIS — K8689 Other specified diseases of pancreas: Secondary | ICD-10-CM | POA: Diagnosis not present

## 2022-09-27 ENCOUNTER — Encounter (HOSPITAL_COMMUNITY)
Admission: RE | Admit: 2022-09-27 | Discharge: 2022-09-27 | Disposition: A | Discharge status: Home / Self Care | Payer: Medicare HMO | Source: Ambulatory Visit | Attending: Orthopedic Surgery | Admitting: Orthopedic Surgery

## 2022-09-27 DIAGNOSIS — Z96652 Presence of left artificial knee joint: Secondary | ICD-10-CM | POA: Diagnosis not present

## 2022-09-27 DIAGNOSIS — T84033A Mechanical loosening of internal left knee prosthetic joint, initial encounter: Secondary | ICD-10-CM | POA: Insufficient documentation

## 2022-09-27 MED ORDER — TECHNETIUM TC 99M MEDRONATE IV KIT
20.0000 | PACK | Freq: Once | INTRAVENOUS | Status: AC | PRN
  Administered 2022-09-27: 20 via INTRAVENOUS

## 2022-10-02 DIAGNOSIS — Z87442 Personal history of urinary calculi: Secondary | ICD-10-CM | POA: Insufficient documentation

## 2022-10-02 NOTE — Progress Notes (Deleted)
Name: Crystal Cooke DOB: Jan 17, 1951 MRN: [id number]  History of Present Illness: Crystal Cooke is a 72 y.o. female who presents today for follow up visit at [facility] [place]. - GU history: 1. OAB with urinary ***frequency, ***nocturia, ***urgency, and ***urge incontinence. 2. Kidney stones. 3. Right renal cyst (Bosniak 2). Per CT abdomen/pelvis w/ contrast on 11/22/2021: "Similar 1.8 cm minimally complex appearing right interpolar renal cyst with thin septation." 4. Persistent microscopic hematuria. Since 2009. Has had multiple negative work ups per prior notes.   At last visit with Dr. Pete Glatter on 08/19/2021: - UA showed 3-10 RBC/hpf. - The plan was CT hematuria protocol and cystoscopy.  Since last visit: 11/22/2021: CT abdomen/pelvis w/ contrast at [facility] showed no GU stones, masses, or hydronephrosis.  Today: She reports ***  She reports {Blank multiple:19197::"improved","persistent / unchanged"} urinary ***frequency, ***nocturia, ***urgency, and ***urge incontinence. Voiding ***x/day and ***x/night on average. Leaking ***x/day on average; using *** ***pads / ***diapers per day on average.  She {Actions; denies-reports:120008} dysuria, gross hematuria, straining to void, or sensations of incomplete emptying.   Fall Screening: Do you usually have a device to assist in your mobility? {yes/no:20286} ***cane / ***walker / ***wheelchair   Medications: Current Outpatient Medications  Medication Sig Dispense Refill   acetaminophen (TYLENOL) 500 MG tablet Take 1,000 mg by mouth every 6 (six) hours as needed for moderate pain or mild pain.     ALPRAZolam (XANAX) 0.5 MG tablet Take 0.5 mg by mouth daily as needed for anxiety.     Ascorbic Acid (VITAMIN C) 1000 MG tablet Take 1,000 mg by mouth daily.     atorvastatin (LIPITOR) 20 MG tablet Take 1 tablet (20 mg total) by mouth daily. 90 tablet 3   CARTIA XT 300 MG 24 hr capsule Take 1 capsule by  mouth once daily in the morning (Patient taking differently: Take 300 mg by mouth daily.) 90 capsule 0   Cholecalciferol (VITAMIN D) 50 MCG (2000 UT) CAPS Take 2,000 Units by mouth daily.     Garlic 1000 MG CAPS Take 1,000 mg by mouth daily.     mirabegron ER (MYRBETRIQ) 50 MG TB24 tablet Take 1 tablet (50 mg total) by mouth daily. (Patient not taking: Reported on 02/14/2022) 28 tablet 0   polyethylene glycol-electrolytes (TRILYTE) 420 g solution Take 4,000 mLs by mouth as directed. 4000 mL 0   potassium chloride SA (KLOR-CON M) 20 MEQ tablet Take 1 tablet (20 mEq total) by mouth daily. 90 tablet 3   tiZANidine (ZANAFLEX) 2 MG tablet Take 2 mg by mouth every 6 (six) hours as needed for muscle spasms. (Patient not taking: Reported on 02/14/2022)     traMADol (ULTRAM) 50 MG tablet Take 50 mg by mouth every 8 (eight) hours as needed for severe pain.     XARELTO 10 MG TABS tablet Take 10 mg by mouth daily.     No current facility-administered medications for this visit.    Allergies: Allergies  Allergen Reactions   Levaquin [Levofloxacin] Other (See Comments)    "tendon damage"   Oxycodone Nausea Only    Dizziness and headaches   Statins Other (See Comments)    "takes the taste out of my mouth    Past Medical History:  Diagnosis Date   Allergy    Anxiety    Atrial fibrillation (HCC)    h/o a fib,  NSR with last EKG, has never seen a cardiologist per pt    DDD (  degenerative disc disease), lumbar    Hypercholesteremia    Hypertension    Leg DVT (deep venous thromboembolism), acute, left (HCC) 01/09/2018   Past Surgical History:  Procedure Laterality Date   COLONOSCOPY  05/05/14   Dr.Benson- normal colonoscopy, retroflexed views revealed no abnormalities.    COLONOSCOPY WITH PROPOFOL N/A 07/25/2021   Procedure: COLONOSCOPY WITH PROPOFOL;  Surgeon: Corbin Ade, MD;  [facility];  Service: Endoscopy;  Laterality: N/A;  9:45am   ESOPHAGOGASTRODUODENOSCOPY  05/05/14    Dr.Benson- normal EGD, retroflexed views revealed no abnormalities    EUS N/A 09/24/2014   Procedure: UPPER ENDOSCOPIC ULTRASOUND (EUS) RADIAL;  Surgeon: Rachael Fee, MD;  Location: [facility];  Service: Endoscopy;  Laterality: N/A;   KNEE ARTHROSCOPY Right 01/05/2017   Procedure: RIGHT KNEE ARTHROSCOPY, PARTIAL LATERAL MENISCECTOMY,;  Surgeon: Eldred Manges, MD;  Location: [facility];  Service: Orthopedics;  Laterality: Right;   none     PANCREAS SURGERY     partial   Family History  Problem Relation Age of Onset   Lung cancer Mother            Hypertension Mother    Cancer Mother    Diabetes Father    Hypertension Father    Lung cancer Father        deceased age 49   Cancer Father    Colon cancer Sister        deceased 2015/01/04, age 84   Pancreatic cancer Neg Hx    Social History   Socioeconomic History   Marital status: Widowed    Spouse name: Not on file   Number of children: 5   Years of education: 2   Highest education level: Not on file  Occupational History   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  Tobacco Use   Smoking status: Never   Smokeless tobacco: Never  Vaping Use   Vaping status: Never Used  Substance and Sexual Activity   Alcohol use: No   Drug use: No   Sexual activity: Yes    Birth control/protection: None, Post-menopausal  Other Topics Concern   Not on file  Social History Narrative   Lives alone   [place] for exercise   Social Determinants of Health   Financial Resource Strain: Medium Risk (06/29/2020)   Overall Financial Resource Strain (CARDIA)    Difficulty of Paying Living Expenses: Somewhat hard  Food Insecurity: No Food Insecurity (06/29/2020)   Hunger Vital Sign    Worried About Running Out of Food in the Last Year: Never true    Ran Out of Food in the Last Year: Never true  Transportation Needs: No Transportation Needs (06/29/2020)   PRAPARE - Administrator, Civil Service (Medical): No    Lack  of Transportation (Non-Medical): No  Physical Activity: Inactive (06/29/2020)   Exercise Vital Sign    Days of Exercise per Week: 0 days    Minutes of Exercise per Session: 0 min  Stress: Stress Concern Present (06/29/2020)   [employer] of Occupational Health - Occupational Stress Questionnaire    Feeling of Stress : To some extent  Social Connections: Socially Isolated (06/29/2020)   Social Connection and Isolation Panel [NHANES]    Frequency of Communication with Friends and Family: More than three times a week    Frequency of Social Gatherings with Friends and Family: Once a week    Attends Religious Services: Never    Active Member  of Clubs or Organizations: No    Attends Banker Meetings: Never    Marital Status: Widowed  Intimate Partner Violence: Not At Risk (06/29/2020)   Humiliation, Afraid, Rape, and Kick questionnaire    Fear of Current or Ex-Partner: No    Emotionally Abused: No    Physically Abused: No    Sexually Abused: No    Review of Systems Constitutional: Patient ***denies any unintentional weight loss or change in strength lntegumentary: Patient ***denies any rashes or pruritus Eyes: Patient denies ***dry eyes ENT: Patient ***denies dry mouth Cardiovascular: Patient ***denies chest pain or syncope Respiratory: Patient ***denies shortness of breath Gastrointestinal: Patient ***denies nausea, vomiting, constipation, or diarrhea Musculoskeletal: Patient ***denies muscle cramps or weakness Neurologic: Patient ***denies convulsions or seizures Psychiatric: Patient ***denies memory problems Allergic/Immunologic: Patient ***denies recent allergic reaction(s) Hematologic/Lymphatic: Patient denies bleeding tendencies Endocrine: Patient ***denies heat/cold intolerance  GU: As per HPI.  OBJECTIVE There were no vitals filed for this visit. There is no height or weight on file to calculate BMI.  Physical Examination Constitutional: ***No obvious  distress; patient is ***non-toxic appearing  Cardiovascular: ***No visible lower extremity edema.  Respiratory: The patient does ***not have audible wheezing/stridor; respirations do ***not appear labored  Gastrointestinal: Abdomen ***non-distended Musculoskeletal: ***Normal ROM of UEs  Skin: ***No obvious rashes/open sores  Neurologic: CN 2-12 grossly ***intact Psychiatric: Answered questions ***appropriately with ***normal affect  Hematologic/Lymphatic/Immunologic: ***No obvious bruises or sites of spontaneous bleeding  UA: ***negative / *** WBC/hpf, *** RBC/hpf, bacteria (***) PVR: *** ml  ASSESSMENT No diagnosis found. *** We discussed the symptoms of overactive bladder (OAB), which include urinary urgency, frequency, nocturia, with or without urge incontinence.   While we may not know the exact etiology of OAB, several risk factors can be identified.  - We discussed this patient's neurogenic risk factors for OAB-type symptoms including ***T2DM, ***nicotine use, ***.  - Likely exacerbated by ***diuretic use, ***caffeine intake, ***consumption of bladder irritants such as (acidic foods, spicy foods, alcohol).   We discussed the following management options in detail including potential benefits, risks, and side effects: Behavioral therapy: Modify fluid intake Decreasing bladder irritants (such as caffeine, acidic foods, spicy foods, alcohol) Urge suppression strategies Bladder retraining / timed voiding Double voiding Medication(s): - For anticholinergic medications, we discussed the potential side effects of anticholinergics including dry eyes, dry mouth, constipation, cognitive impairment and urinary retention.  - For beta-3 agonist medication, we discussed the risk for urinary retention and the potential side effect of elevated blood pressure specific to Myrbetriq (which is more likely to occur in individuals with uncontrolled hypertension).  For refractory cases: PTNS  (posterior tibial nerve stimulation) Sacral neuromodulation trial (Medtronic lnterStim or Axonics implant) Bladder Botox injections In extreme cases, SP tube placement  ***In order to further evaluate urinary incontinence, She was instructed to complete a 3-day bladder diary.  ***Discussed recommendation for urodynamic testing, which was described in detail including risks such as bleeding, infection, organ/tissue/nerve damage.  She decided to proceed with *** ***work on behavioral modifications including ***minimizing caffeine intake and working on ***timed voiding.  Will plan for follow up in ***8 weeks / *** months / ***1 year or sooner if needed. Pt verbalized understanding and agreement. All questions were answered.  PLAN Advised the following: *** ***. Minimize caffeine intake. ***. Work on timed voiding. ***. Urge suppression strategies. ***. Double/ triple voiding. ***. No follow-ups on file.  No orders of the defined types were placed in this encounter.   It has  been explained that the patient is to follow regularly with their PCP in addition to all other providers involved in their care and to follow instructions provided by these respective offices. Patient advised to contact urology clinic if any urologic-pertaining questions, concerns, new symptoms or problems arise in the interim period.  There are no Patient Instructions on file for this visit.  Electronically signed by:  Donnita Falls, FNP   10/02/22    12:12 PM

## 2022-10-03 ENCOUNTER — Telehealth: Payer: Self-pay

## 2022-10-03 NOTE — Telephone Encounter (Signed)
Left detail message making patient aware to bring recent urine culture to her up coming appointment Thursday with Sarah.

## 2022-10-03 NOTE — Telephone Encounter (Signed)
-----   Message from Donnita Falls sent at 10/02/2022 12:22 PM EDT ----- Patient coming this Thursday for "history of UTI" but I can't find any recent urine cultures in chart. Please contact patient to ask for records of that. Thanks!

## 2022-10-05 ENCOUNTER — Ambulatory Visit: Payer: Medicare HMO | Admitting: Urology

## 2022-10-05 DIAGNOSIS — R3129 Other microscopic hematuria: Secondary | ICD-10-CM

## 2022-10-05 DIAGNOSIS — Z87442 Personal history of urinary calculi: Secondary | ICD-10-CM

## 2022-10-05 DIAGNOSIS — N281 Cyst of kidney, acquired: Secondary | ICD-10-CM

## 2022-10-05 DIAGNOSIS — N3281 Overactive bladder: Secondary | ICD-10-CM

## 2022-10-06 DIAGNOSIS — Z008 Encounter for other general examination: Secondary | ICD-10-CM | POA: Diagnosis not present

## 2022-10-06 DIAGNOSIS — E876 Hypokalemia: Secondary | ICD-10-CM | POA: Diagnosis not present

## 2022-10-06 DIAGNOSIS — Z809 Family history of malignant neoplasm, unspecified: Secondary | ICD-10-CM | POA: Diagnosis not present

## 2022-10-06 DIAGNOSIS — M545 Low back pain, unspecified: Secondary | ICD-10-CM | POA: Diagnosis not present

## 2022-10-06 DIAGNOSIS — I4891 Unspecified atrial fibrillation: Secondary | ICD-10-CM | POA: Diagnosis not present

## 2022-10-06 DIAGNOSIS — N1832 Chronic kidney disease, stage 3b: Secondary | ICD-10-CM | POA: Diagnosis not present

## 2022-10-06 DIAGNOSIS — J309 Allergic rhinitis, unspecified: Secondary | ICD-10-CM | POA: Diagnosis not present

## 2022-10-06 DIAGNOSIS — M1990 Unspecified osteoarthritis, unspecified site: Secondary | ICD-10-CM | POA: Diagnosis not present

## 2022-10-06 DIAGNOSIS — E785 Hyperlipidemia, unspecified: Secondary | ICD-10-CM | POA: Diagnosis not present

## 2022-10-06 DIAGNOSIS — G629 Polyneuropathy, unspecified: Secondary | ICD-10-CM | POA: Diagnosis not present

## 2022-10-06 DIAGNOSIS — D6869 Other thrombophilia: Secondary | ICD-10-CM | POA: Diagnosis not present

## 2022-10-06 DIAGNOSIS — Z8249 Family history of ischemic heart disease and other diseases of the circulatory system: Secondary | ICD-10-CM | POA: Diagnosis not present

## 2022-10-06 DIAGNOSIS — F419 Anxiety disorder, unspecified: Secondary | ICD-10-CM | POA: Diagnosis not present

## 2022-10-13 DIAGNOSIS — M25561 Pain in right knee: Secondary | ICD-10-CM | POA: Diagnosis not present

## 2022-10-31 ENCOUNTER — Ambulatory Visit: Payer: Medicare HMO | Admitting: Urology

## 2022-11-09 DIAGNOSIS — B370 Candidal stomatitis: Secondary | ICD-10-CM | POA: Diagnosis not present

## 2022-11-09 DIAGNOSIS — N281 Cyst of kidney, acquired: Secondary | ICD-10-CM | POA: Diagnosis not present

## 2022-11-09 DIAGNOSIS — Z23 Encounter for immunization: Secondary | ICD-10-CM | POA: Diagnosis not present

## 2022-11-09 DIAGNOSIS — R350 Frequency of micturition: Secondary | ICD-10-CM | POA: Diagnosis not present

## 2022-11-09 DIAGNOSIS — Z79899 Other long term (current) drug therapy: Secondary | ICD-10-CM | POA: Diagnosis not present

## 2022-11-13 DIAGNOSIS — H25813 Combined forms of age-related cataract, bilateral: Secondary | ICD-10-CM | POA: Diagnosis not present

## 2022-11-23 ENCOUNTER — Ambulatory Visit: Payer: Medicare HMO | Admitting: Internal Medicine

## 2022-12-14 NOTE — Progress Notes (Signed)
Name: Crystal Cooke DOB: 07/21/1950 MRN: [id number]  History of Present Illness: Crystal Cooke is a 72 y.o. female who presents today for follow up visit at [facility] [place]. - GU history: 1. OAB. 2. Kidney stones. 3. Right renal cyst (Bosniak 2). Per CT abdomen/pelvis w/ contrast on 11/22/2021: "Similar 1.8 cm minimally complex appearing right interpolar renal cyst with thin septation." 4. Persistent microscopic hematuria. Since 2009. Has had multiple negative work ups per prior notes.  5. Herpes.  At last visit with Dr. Pete Glatter on 08/19/2021: - UA showed 3-10 RBC/hpf. - The plan was CT hematuria protocol and cystoscopy. - She was prescribed Myrbetriq but today reports that she never started that.  Since last visit: 11/22/2021: CT abdomen/pelvis w/ contrast at [facility] showed no GU stones, masses, or hydronephrosis.  Today: She reports concern for UTI; reports that over the past month or so she has had increased urinary frequency, nocturia, urgency, and constant sensation of bladder pressure. Voiding >8x/day and 4x/night on average. Denies urge incontinence, dysuria, gross hematuria, straining to void, or sensations of incomplete emptying.  She denies flank pain or abdominal pain. She denies fevers, nausea, or vomiting.   Fall Screening: Do you usually have a device to assist in your mobility? No   Medications: Current Outpatient Medications  Medication Sig Dispense Refill   acetaminophen (TYLENOL) 500 MG tablet Take 1,000 mg by mouth every 6 (six) hours as needed for moderate pain or mild pain.     ALPRAZolam (XANAX) 0.5 MG tablet Take 0.5 mg by mouth daily as needed for anxiety.     Ascorbic Acid (VITAMIN C) 1000 MG tablet Take 1,000 mg by mouth daily.     atorvastatin (LIPITOR) 20 MG tablet Take 1 tablet (20 mg total) by mouth daily. 90 tablet 3   CARTIA XT 300 MG 24 hr capsule Take 1 capsule by mouth once daily in the morning (Patient  taking differently: Take 300 mg by mouth daily.) 90 capsule 0   Cholecalciferol (VITAMIN D) 50 MCG (2000 UT) CAPS Take 2,000 Units by mouth daily.     Garlic 1000 MG CAPS Take 1,000 mg by mouth daily.     nitrofurantoin, macrocrystal-monohydrate, (MACROBID) 100 MG capsule Take 1 capsule (100 mg total) by mouth 2 (two) times daily for 7 days. 14 capsule 0   polyethylene glycol-electrolytes (TRILYTE) 420 g solution Take 4,000 mLs by mouth as directed. 4000 mL 0   potassium chloride SA (KLOR-CON M) 20 MEQ tablet Take 1 tablet (20 mEq total) by mouth daily. 90 tablet 3   tiZANidine (ZANAFLEX) 2 MG tablet Take 2 mg by mouth every 6 (six) hours as needed for muscle spasms.     traMADol (ULTRAM) 50 MG tablet Take 50 mg by mouth every 8 (eight) hours as needed for severe pain.     XARELTO 10 MG TABS tablet Take 10 mg by mouth daily.     No current facility-administered medications for this visit.    Allergies: Allergies  Allergen Reactions   Levaquin [Levofloxacin] Other (See Comments)    "tendon damage"   Oxycodone Nausea Only    Dizziness and headaches   Statins Other (See Comments)    "takes the taste out of my mouth    Past Medical History:  Diagnosis Date   Allergy    Anxiety    Atrial fibrillation (HCC)    h/o a fib,  NSR with last EKG, has never seen a cardiologist per  pt    DDD (degenerative disc disease), lumbar    Hypercholesteremia    Hypertension    Leg DVT (deep venous thromboembolism), acute, left (HCC) 01/09/2018   Past Surgical History:  Procedure Laterality Date   COLONOSCOPY  05/05/14   Dr.Benson- normal colonoscopy, retroflexed views revealed no abnormalities.    COLONOSCOPY WITH PROPOFOL N/A 07/25/2021   Procedure: COLONOSCOPY WITH PROPOFOL;  Surgeon: Corbin Ade, MD;  [facility];  Service: Endoscopy;  Laterality: N/A;  9:45am   ESOPHAGOGASTRODUODENOSCOPY  05/05/14   Dr.Benson- normal EGD, retroflexed views revealed no abnormalities    EUS N/A  09/24/2014   Procedure: UPPER ENDOSCOPIC ULTRASOUND (EUS) RADIAL;  Surgeon: Rachael Fee, MD;  Location: [facility];  Service: Endoscopy;  Laterality: N/A;   KNEE ARTHROSCOPY Right 01/05/2017   Procedure: RIGHT KNEE ARTHROSCOPY, PARTIAL LATERAL MENISCECTOMY,;  Surgeon: Eldred Manges, MD;  Location: [facility];  Service: Orthopedics;  Laterality: Right;   none     PANCREAS SURGERY     partial   Family History  Problem Relation Age of Onset   Lung cancer Mother            Hypertension Mother    Cancer Mother    Diabetes Father    Hypertension Father    Lung cancer Father        deceased age 105   Cancer Father    Colon cancer Sister        deceased 2015/01/25, age 63   Pancreatic cancer Neg Hx    Social History   Socioeconomic History   Marital status: Widowed    Spouse name: Not on file   Number of children: 5   Years of education: 17   Highest education level: Not on file  Occupational History   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  Tobacco Use   Smoking status: Never   Smokeless tobacco: Never  Vaping Use   Vaping status: Never Used  Substance and Sexual Activity   Alcohol use: No   Drug use: No   Sexual activity: Yes    Birth control/protection: None, Post-menopausal  Other Topics Concern   Not on file  Social History Narrative   Lives alone   [place] for exercise   Social Determinants of Health   Financial Resource Strain: Medium Risk (06/29/2020)   Overall Financial Resource Strain (CARDIA)    Difficulty of Paying Living Expenses: Somewhat hard  Food Insecurity: No Food Insecurity (06/29/2020)   Hunger Vital Sign    Worried About Running Out of Food in the Last Year: Never true    Ran Out of Food in the Last Year: Never true  Transportation Needs: No Transportation Needs (06/29/2020)   PRAPARE - Administrator, Civil Service (Medical): No    Lack of Transportation (Non-Medical): No  Physical Activity: Inactive (06/29/2020)    Exercise Vital Sign    Days of Exercise per Week: 0 days    Minutes of Exercise per Session: 0 min  Stress: Stress Concern Present (06/29/2020)   [employer] of Occupational Health - Occupational Stress Questionnaire    Feeling of Stress : To some extent  Social Connections: Socially Isolated (06/29/2020)   Social Connection and Isolation Panel [NHANES]    Frequency of Communication with Friends and Family: More than three times a week    Frequency of Social Gatherings with Friends and Family: Once a week    Attends Religious Services: Never  Active Member of Clubs or Organizations: No    Attends Banker Meetings: Never    Marital Status: Widowed  Intimate Partner Violence: Not At Risk (06/29/2020)   Humiliation, Afraid, Rape, and Kick questionnaire    Fear of Current or Ex-Partner: No    Emotionally Abused: No    Physically Abused: No    Sexually Abused: No   Review of Systems Constitutional: Patient denies any unintentional weight loss or change in strength lntegumentary: Patient denies any rashes or pruritus Cardiovascular: Patient denies chest pain or syncope Respiratory: Patient denies shortness of breath Gastrointestinal: Patient denies nausea, vomiting, constipation, or diarrhea Musculoskeletal: Patient denies muscle cramps or weakness Neurologic: Patient denies convulsions or seizures Allergic/Immunologic: Patient denies recent allergic reaction(s) Hematologic/Lymphatic: Patient denies bleeding tendencies Endocrine: Patient denies heat/cold intolerance  GU: As per HPI.  OBJECTIVE Vitals:   12/15/22 0835  BP: 128/75  Pulse: 74   There is no height or weight on file to calculate BMI.  Physical Examination Constitutional: No obvious distress; patient is non-toxic appearing  Cardiovascular: No visible lower extremity edema.  Respiratory: The patient does not have audible wheezing/stridor; respirations do not appear labored  Gastrointestinal:  Abdomen non-distended Musculoskeletal: Normal ROM of UEs  Skin: No obvious rashes/open sores  Neurologic: CN 2-12 grossly intact Psychiatric: Answered questions appropriately with normal affect  Hematologic/Lymphatic/Immunologic: No obvious bruises or sites of spontaneous bleeding  UA: 6-10 WBC/hpf, >30 RBC/hpf, bacteria (few) PVR: 0 ml  ASSESSMENT OAB (overactive bladder) - Plan: Urinalysis, Routine w reflex microscopic, BLADDER SCAN AMB NON-IMAGING  Microscopic hematuria  History of kidney stones  Increased frequency of urination  Complex renal cyst  UTI symptoms - Plan: Urine culture, nitrofurantoin, macrocrystal-monohydrate, (MACROBID) 100 MG capsule  Abnormal urinalysis - Plan: Urine culture, nitrofurantoin, macrocrystal-monohydrate, (MACROBID) 100 MG capsule  Abnormal UA. Will check urine culture and treat empirically with Nitrofurantoin while awaiting culture results and sensitivities.  Will plan for follow up in 2 weeks for recheck. Pt verbalized understanding and agreement. All questions were answered.  PLAN Advised the following: 1. Urine culture. 2. Nitrofurantoin 100 mg 2x/day for 7 days. 3. Return in about 2 weeks (around 12/29/2022) for UA, PVR, & f/u with Evette Georges NP.  Orders Placed This Encounter  Procedures   Urine culture   Urinalysis, Routine w reflex microscopic   BLADDER SCAN AMB NON-IMAGING    It has been explained that the patient is to follow regularly with their PCP in addition to all other providers involved in their care and to follow instructions provided by these respective offices. Patient advised to contact urology clinic if any urologic-pertaining questions, concerns, new symptoms or problems arise in the interim period.  There are no Patient Instructions on file for this visit.  Electronically signed by:  Donnita Falls, FNP   12/15/22    9:08 AM

## 2022-12-15 ENCOUNTER — Ambulatory Visit: Payer: Medicare HMO | Admitting: Urology

## 2022-12-15 ENCOUNTER — Encounter: Payer: Self-pay | Admitting: Urology

## 2022-12-15 VITALS — BP 128/75 | HR 74

## 2022-12-15 DIAGNOSIS — R8290 Unspecified abnormal findings in urine: Secondary | ICD-10-CM | POA: Diagnosis not present

## 2022-12-15 DIAGNOSIS — N3281 Overactive bladder: Secondary | ICD-10-CM | POA: Diagnosis not present

## 2022-12-15 DIAGNOSIS — R3129 Other microscopic hematuria: Secondary | ICD-10-CM | POA: Diagnosis not present

## 2022-12-15 DIAGNOSIS — N281 Cyst of kidney, acquired: Secondary | ICD-10-CM | POA: Diagnosis not present

## 2022-12-15 DIAGNOSIS — Z87442 Personal history of urinary calculi: Secondary | ICD-10-CM

## 2022-12-15 DIAGNOSIS — R399 Unspecified symptoms and signs involving the genitourinary system: Secondary | ICD-10-CM

## 2022-12-15 DIAGNOSIS — R350 Frequency of micturition: Secondary | ICD-10-CM

## 2022-12-15 LAB — URINALYSIS, ROUTINE W REFLEX MICROSCOPIC
Bilirubin, UA: NEGATIVE
Glucose, UA: NEGATIVE
Ketones, UA: NEGATIVE
Nitrite, UA: NEGATIVE
Protein,UA: NEGATIVE
Specific Gravity, UA: 1.025 (ref 1.005–1.030)
Urobilinogen, Ur: 0.2 mg/dL (ref 0.2–1.0)
pH, UA: 6 (ref 5.0–7.5)

## 2022-12-15 LAB — MICROSCOPIC EXAMINATION: RBC, Urine: >30 /[HPF] — AB (ref 0–2)

## 2022-12-15 MED ORDER — NITROFURANTOIN MONOHYD MACRO 100 MG PO CAPS: 100.0000 mg | ORAL_CAPSULE | Freq: Two times a day (BID) | ORAL | 0 refills | Status: AC

## 2022-12-15 NOTE — Progress Notes (Signed)
post void residual = 0 ml

## 2022-12-17 LAB — URINE CULTURE: Organism ID, Bacteria: NO GROWTH

## 2022-12-18 ENCOUNTER — Other Ambulatory Visit: Payer: Self-pay | Admitting: Urology

## 2022-12-18 ENCOUNTER — Telehealth: Payer: Self-pay

## 2022-12-18 DIAGNOSIS — R3129 Other microscopic hematuria: Secondary | ICD-10-CM

## 2022-12-18 DIAGNOSIS — R399 Unspecified symptoms and signs involving the genitourinary system: Secondary | ICD-10-CM

## 2022-12-18 DIAGNOSIS — N281 Cyst of kidney, acquired: Secondary | ICD-10-CM

## 2022-12-18 DIAGNOSIS — Z87442 Personal history of urinary calculi: Secondary | ICD-10-CM

## 2022-12-18 NOTE — Telephone Encounter (Signed)
Patient is aware of NP's response to results.  She states she is scheduled for a CT this month with the cancer center in [place].

## 2022-12-18 NOTE — Progress Notes (Signed)
Please notify patient:  - Negative urine culture, no antibiotic needed at this time. She can stop the Macrobid. - Dr. Pete Glatter had previously advised cystoscopy on 08/19/2021, which was never completed. Please advise patient that cystoscopy is recommended at this time to further evaluate her LUTS and persistent microscopic hematuria. Please assist her with getting cystoscopy scheduled with any urology MD here (1st available); can cancel the appointment scheduled with me on 12/28/2022 in favor of that. - I will also order CT for further evaluation since it's been >1 year since relevant imaging was performed (last CT was 11/22/2021).

## 2022-12-18 NOTE — Telephone Encounter (Signed)
-----   Message from Donnita Falls sent at 12/18/2022  8:53 AM EST ----- Please notify patient:  - Negative urine culture, no antibiotic needed at this time. She can stop the Macrobid. - Dr. Pete Glatter had previously advised cystoscopy on 08/19/2021, which was never completed. Please advise patient that cystoscopy is recommended at this time to further evaluate her LUTS and persistent microscopic hematuria. Please assist her with getting cystoscopy scheduled with any urology MD here (1st available); can cancel the appointment scheduled with me on 12/28/2022 in favor of that. - I will also order CT for further evaluation since it's been >1 year since relevant imaging was performed (last CT was 11/22/2021).

## 2022-12-19 NOTE — Telephone Encounter (Signed)
Canceled appt on 11/21, patient aware.

## 2022-12-27 DIAGNOSIS — R944 Abnormal results of kidney function studies: Secondary | ICD-10-CM | POA: Diagnosis not present

## 2022-12-27 DIAGNOSIS — E782 Mixed hyperlipidemia: Secondary | ICD-10-CM | POA: Diagnosis not present

## 2022-12-27 DIAGNOSIS — E559 Vitamin D deficiency, unspecified: Secondary | ICD-10-CM | POA: Diagnosis not present

## 2022-12-27 DIAGNOSIS — R7303 Prediabetes: Secondary | ICD-10-CM | POA: Diagnosis not present

## 2022-12-28 ENCOUNTER — Ambulatory Visit: Payer: Medicare HMO | Admitting: Urology

## 2022-12-28 LAB — LAB REPORT - SCANNED
A1c: 6
Albumin, Urine POC: 33.1
Creatinine, POC: 141.6 mg/dL
EGFR: 62
Microalb Creat Ratio: 23

## 2023-01-01 DIAGNOSIS — N3281 Overactive bladder: Secondary | ICD-10-CM | POA: Diagnosis not present

## 2023-01-01 DIAGNOSIS — K8689 Other specified diseases of pancreas: Secondary | ICD-10-CM | POA: Diagnosis not present

## 2023-01-01 DIAGNOSIS — F411 Generalized anxiety disorder: Secondary | ICD-10-CM | POA: Diagnosis not present

## 2023-01-01 DIAGNOSIS — R809 Proteinuria, unspecified: Secondary | ICD-10-CM | POA: Diagnosis not present

## 2023-01-01 DIAGNOSIS — I10 Essential (primary) hypertension: Secondary | ICD-10-CM | POA: Diagnosis not present

## 2023-01-01 DIAGNOSIS — E782 Mixed hyperlipidemia: Secondary | ICD-10-CM | POA: Diagnosis not present

## 2023-01-01 DIAGNOSIS — R944 Abnormal results of kidney function studies: Secondary | ICD-10-CM | POA: Diagnosis not present

## 2023-01-01 DIAGNOSIS — Z86711 Personal history of pulmonary embolism: Secondary | ICD-10-CM | POA: Diagnosis not present

## 2023-01-01 DIAGNOSIS — M545 Low back pain, unspecified: Secondary | ICD-10-CM | POA: Diagnosis not present

## 2023-01-01 DIAGNOSIS — R7303 Prediabetes: Secondary | ICD-10-CM | POA: Diagnosis not present

## 2023-01-01 DIAGNOSIS — G609 Hereditary and idiopathic neuropathy, unspecified: Secondary | ICD-10-CM | POA: Diagnosis not present

## 2023-01-01 DIAGNOSIS — E876 Hypokalemia: Secondary | ICD-10-CM | POA: Diagnosis not present

## 2023-01-02 DIAGNOSIS — Z90410 Acquired total absence of pancreas: Secondary | ICD-10-CM | POA: Diagnosis not present

## 2023-01-02 DIAGNOSIS — K862 Cyst of pancreas: Secondary | ICD-10-CM | POA: Diagnosis not present

## 2023-01-02 DIAGNOSIS — Z09 Encounter for follow-up examination after completed treatment for conditions other than malignant neoplasm: Secondary | ICD-10-CM | POA: Diagnosis not present

## 2023-01-22 ENCOUNTER — Telehealth: Payer: Self-pay

## 2023-01-22 NOTE — Telephone Encounter (Signed)
-----   Message from Joes sent at 01/03/2023 10:02 AM EST ----- LDL is 99  Given that she has atherosclerosis of vessels this is too high Is she taking lipitor 20 mg ?     If so I would increase to 40 mg and add Zetia 10 mg    Follow up lipomed in 8 wks with liver panel Goal of LDL 70 or lower

## 2023-01-22 NOTE — Telephone Encounter (Signed)
Finally reached the pt and she reports she is no longer on Atorvastatin but her PCP put her on Rosuvastatin 5 mg daily a few weeks ago... will send to Dr Tenny Craw for review.

## 2023-01-22 NOTE — Telephone Encounter (Signed)
Pt advised and has 03/2023 OV with Dr Tenny Craw.

## 2023-01-22 NOTE — Telephone Encounter (Signed)
REcheck lipomed in early Feb 2025 if just switched to [facility] Check liver panel as well

## 2023-02-06 ENCOUNTER — Other Ambulatory Visit: Payer: Medicare HMO | Admitting: Urology

## 2023-02-26 DIAGNOSIS — H2513 Age-related nuclear cataract, bilateral: Secondary | ICD-10-CM | POA: Diagnosis not present

## 2023-02-26 DIAGNOSIS — H01002 Unspecified blepharitis right lower eyelid: Secondary | ICD-10-CM | POA: Diagnosis not present

## 2023-02-26 DIAGNOSIS — H01004 Unspecified blepharitis left upper eyelid: Secondary | ICD-10-CM | POA: Diagnosis not present

## 2023-02-26 DIAGNOSIS — H01001 Unspecified blepharitis right upper eyelid: Secondary | ICD-10-CM | POA: Diagnosis not present

## 2023-03-06 ENCOUNTER — Other Ambulatory Visit: Payer: Medicare HMO

## 2023-03-11 NOTE — Progress Notes (Deleted)
 Cardiology Office Note   Date:  03/11/2023   ID:  Crystal, Cooke 1950-12-18, MRN [id number]  PCP:  Benita Stabile, MD  Cardiologist:   Dietrich Pates, MD   Pt presents for follow up of chest pain and afib        History of Present Illness: Crystal Cooke is a 73 y.o. female with a history of atrial fibrillation, atypical CP HTN, HL, PE In Sept 2021 abdominal CT showed RLL PE and LLLL PE)  Initially on Eliquis; switched to Xarelto   Pt follows in hematology    I saw the pt in 2022  CP was atypical (sharp, when lying down, some arm/hand tingling, pleuritic, positional at times)  Since seen the pt was seen in the ED for CP with exertional SOB (March 2023) Pains were sharp   She was sent home   Since seen she notes occsaional sharp left sided pains   Not associated with activity   Pleuritic      No pressure with activity    She denies SOB  No palpitaitons   She is not taking Crestor because it makes her feet hurt  Diet  Egg whites, water ,  Lunch   PB sandwich    Dinner  Chicken salad  Brocolli  I saw the pt in Jan 2024  No outpatient medications have been marked as taking for the 03/12/23 encounter (Appointment) with Pricilla Riffle, MD.     Allergies:   Levaquin [levofloxacin], Oxycodone, and Statins   Past Medical History:  Diagnosis Date   Allergy    Anxiety    Atrial fibrillation (HCC)    h/o a fib,  NSR with last EKG, has never seen a cardiologist per pt    DDD (degenerative disc disease), lumbar    Hypercholesteremia    Hypertension    Leg DVT (deep venous thromboembolism), acute, left (HCC) 01/09/2018    Past Surgical History:  Procedure Laterality Date   COLONOSCOPY  05/05/14   Dr.Benson- normal colonoscopy, retroflexed views revealed no abnormalities.    COLONOSCOPY WITH PROPOFOL N/A 07/25/2021   Procedure: COLONOSCOPY WITH PROPOFOL;  Surgeon: Corbin Ade, MD;  Location: [facility];  Service: Endoscopy;  Laterality: N/A;  9:45am    ESOPHAGOGASTRODUODENOSCOPY  05/05/14   Dr.Benson- normal EGD, retroflexed views revealed no abnormalities    EUS N/A 09/24/2014   Procedure: UPPER ENDOSCOPIC ULTRASOUND (EUS) RADIAL;  Surgeon: Rachael Fee, MD;  Location: [facility];  Service: Endoscopy;  Laterality: N/A;   KNEE ARTHROSCOPY Right 01/05/2017   Procedure: RIGHT KNEE ARTHROSCOPY, PARTIAL LATERAL MENISCECTOMY,;  Surgeon: Eldred Manges, MD;  Location: [facility];  Service: Orthopedics;  Laterality: Right;   none     PANCREAS SURGERY     partial     Social History:  The patient  reports that she has never smoked. She has never used smokeless tobacco. She reports that she does not drink alcohol and does not use drugs.   Family History:  The patient's family history includes Cancer in her father and mother; Colon cancer in her sister; Diabetes in her father; Hypertension in her father and mother; Lung cancer in her father and mother.    ROS:  Please see the history of present illness. All other systems are reviewed and  Negative to the above problem except as noted.    PHYSICAL EXAM: VS:  LMP 02/07/1999 (Approximate)   GEN: Well nourished, well developed, in no acute  distress  HEENT: normal  Neck: no JVD, carotid brui   Cardiac: RRR; no murmurs,  No LE  edema  Respiratory:  clear to auscultation bilaterally GI: soft, nontender, nondistended, + BS  No hepatomegaly  MS: no deformity Moving all extremities   Skin: warm and dry, no rash Neuro:  Strength and sensation are intact Psych: euthymic mood, full affect   EKG:  EKG is ordered today.  NSR 75 bpm    Lipid Panel    Component Value Date/Time   CHOL 204 (H) 10/25/2020 0853   CHOL 245 (H) 03/12/2017 1410   TRIG 40 10/25/2020 0853   HDL 73 10/25/2020 0853   HDL 66 03/12/2017 1410   CHOLHDL 2.8 10/25/2020 0853   VLDL 17 04/28/2016 1428   LDLCALC 119 (H) 10/25/2020 0853      Wt Readings from Last 3 Encounters:  02/14/22 125 lb 12.8 oz (57.1  kg)  10/30/21 120 lb (54.4 kg)  08/19/21 125 lb (56.7 kg)      ASSESSMENT AND PLAN:  1  Chest pain  CP is atypical   I would follow    2  Hx PE   Long term anticoagulation since recurrent,unprovoked   Get CBC   3  HTN   ON Cartia    BP is controlled     5  Atherosclerosis   CT in 2016 showed abdomal aorta with plaquing    She quit crestor   Switch to lipitor   6   HL  Switch to lipitor as she had aching feet no Crestor    FOllow up in 2 months with labs     6   K   Refill Rx  Check in 8 months   7  Metabolic   A1C  Discussed diet   CUt back on carbs  Recheck A1C in 2 months   Check:   CBC, CMET, lipomed, A1C, Vit D in 2 months CK  F/U in October    Sooner for problems   Current medicines are reviewed at length with the patient today.  The patient does not have concerns regarding medicines.  Signed, Dietrich Pates, MD  03/11/2023 8:11 PM    [facility] [address] [place], [place], [place]  [address] Phone: [phone]; Fax: [phone]

## 2023-03-12 ENCOUNTER — Ambulatory Visit: Payer: Medicare HMO | Attending: Internal Medicine | Admitting: Internal Medicine

## 2023-03-27 ENCOUNTER — Encounter (HOSPITAL_COMMUNITY): Admission: RE | Admit: 2023-03-27 | Payer: Medicare HMO | Source: Ambulatory Visit

## 2023-04-12 ENCOUNTER — Other Ambulatory Visit (HOSPITAL_COMMUNITY): Payer: Medicare HMO

## 2023-04-26 DIAGNOSIS — Z713 Dietary counseling and surveillance: Secondary | ICD-10-CM | POA: Diagnosis not present

## 2023-04-26 DIAGNOSIS — Z6821 Body mass index (BMI) 21.0-21.9, adult: Secondary | ICD-10-CM | POA: Diagnosis not present

## 2023-04-26 DIAGNOSIS — Z79899 Other long term (current) drug therapy: Secondary | ICD-10-CM | POA: Diagnosis not present

## 2023-04-26 DIAGNOSIS — R519 Headache, unspecified: Secondary | ICD-10-CM | POA: Diagnosis not present

## 2023-04-26 DIAGNOSIS — J302 Other seasonal allergic rhinitis: Secondary | ICD-10-CM | POA: Diagnosis not present

## 2023-04-26 DIAGNOSIS — K8689 Other specified diseases of pancreas: Secondary | ICD-10-CM | POA: Diagnosis not present

## 2023-04-26 DIAGNOSIS — R7303 Prediabetes: Secondary | ICD-10-CM | POA: Diagnosis not present

## 2023-04-26 DIAGNOSIS — F411 Generalized anxiety disorder: Secondary | ICD-10-CM | POA: Diagnosis not present

## 2023-05-28 ENCOUNTER — Encounter (HOSPITAL_COMMUNITY): Payer: Medicare HMO

## 2023-06-08 DIAGNOSIS — N390 Urinary tract infection, site not specified: Secondary | ICD-10-CM | POA: Diagnosis not present

## 2023-06-08 DIAGNOSIS — R059 Cough, unspecified: Secondary | ICD-10-CM | POA: Diagnosis not present

## 2023-06-08 DIAGNOSIS — Z20822 Contact with and (suspected) exposure to covid-19: Secondary | ICD-10-CM | POA: Diagnosis not present

## 2023-06-08 DIAGNOSIS — R350 Frequency of micturition: Secondary | ICD-10-CM | POA: Diagnosis not present

## 2023-06-13 ENCOUNTER — Other Ambulatory Visit (HOSPITAL_COMMUNITY): Payer: Medicare HMO

## 2023-06-20 DIAGNOSIS — N390 Urinary tract infection, site not specified: Secondary | ICD-10-CM | POA: Diagnosis not present

## 2023-06-20 DIAGNOSIS — R3915 Urgency of urination: Secondary | ICD-10-CM | POA: Diagnosis not present

## 2023-06-20 DIAGNOSIS — M25561 Pain in right knee: Secondary | ICD-10-CM | POA: Diagnosis not present

## 2023-06-20 DIAGNOSIS — Y831 Surgical operation with implant of artificial internal device as the cause of abnormal reaction of the patient, or of later complication, without mention of misadventure at the time of the procedure: Secondary | ICD-10-CM | POA: Diagnosis not present

## 2023-06-20 DIAGNOSIS — T8454XA Infection and inflammatory reaction due to internal left knee prosthesis, initial encounter: Secondary | ICD-10-CM | POA: Diagnosis not present

## 2023-06-20 DIAGNOSIS — M25512 Pain in left shoulder: Secondary | ICD-10-CM | POA: Diagnosis not present

## 2023-06-20 DIAGNOSIS — M25562 Pain in left knee: Secondary | ICD-10-CM | POA: Diagnosis not present

## 2023-06-27 DIAGNOSIS — E782 Mixed hyperlipidemia: Secondary | ICD-10-CM | POA: Diagnosis not present

## 2023-06-27 DIAGNOSIS — R7303 Prediabetes: Secondary | ICD-10-CM | POA: Diagnosis not present

## 2023-06-27 DIAGNOSIS — E559 Vitamin D deficiency, unspecified: Secondary | ICD-10-CM | POA: Diagnosis not present

## 2023-06-29 ENCOUNTER — Encounter (HOSPITAL_COMMUNITY)
Admission: RE | Admit: 2023-06-29 | Discharge: 2023-06-29 | Disposition: A | Discharge status: Home / Self Care | Source: Ambulatory Visit | Attending: Ophthalmology | Admitting: Ophthalmology

## 2023-06-29 ENCOUNTER — Encounter (HOSPITAL_COMMUNITY): Payer: Self-pay

## 2023-07-04 ENCOUNTER — Ambulatory Visit (HOSPITAL_COMMUNITY)

## 2023-07-04 NOTE — H&P (Signed)
 Surgical History & Physical  Patient Name: Crystal Cooke  DOB: April 03, 1950  Surgery: Cataract extraction with intraocular lens implant phacoemulsification; Right Eye Surgeon: Tarri Farm MD Surgery Date: 07/06/2023 Pre-Op Date: 07/04/2023  HPI: A 20 Yr. old female patient 1. The patient is a new patient, present today for cataract evaluation. The patient complains of difficulty when reading fine print, books, newspaper, instructions etc., which began 1 year ago. Both eyes are affected. The episode is constant. The patient describes hazy symptoms affecting their eyes/vision. The condition's severity decreased since last visit. The complaint is associated with floater. This is negatively affecting the patient's quality of life and the patient is unable to function adequately in life with the current level of vision. HPI was performed by Tarri Farm .  Medical History: Dry Eyes Cataracts  Anxiety High Blood Pressure Lung Problems  Review of Systems Cardiovascular High Blood Pressure Psychiatry Anxiety Respiratory blood clots on both lungs All recorded systems are negative except as noted above.  Social Never smoked   Medication Prednisolone acetate, Prolensa, Moxifloxacin,  fluconazole ,  Cartia  XT ,  tramadol ,  diltiazem  HCl (Cardizem  CD) ,  alprazolam  ,  nystatin  ,  Klor-Con  M20 ,  rosuvastatin ,  nitrofurantoin  monohyd/m-cryst ,  Eliquis   Sx/Procedures Knee Replacement, Pancreatic  Drug Allergies  amoxicillin    History & Physical: Heent: cataract NECK: supple without bruits LUNGS: lungs clear to auscultation CV: regular rate and rhythm Abdomen: soft and non-tender  Impression & Plan: Assessment: 1.  NUCLEAR SCLEROSIS AGE RELATED; Both Eyes (H25.13) 2.  BLEPHARITIS; Right Upper Lid, Right Lower Lid, Left Upper Lid, Left Lower Lid (H01.001, H01.002,H01.004,H01.005) 3.  CONJUNCTIVOCHALASIS; Both Eyes (H11.823) 4.  Pinguecula; Both Eyes (H11.153)  Plan: 1.   Cataract accounts for the patient's decreased vision. This visual impairment is not correctable with a tolerable change in glasses or contact lenses. Cataract surgery with an implantation of a new lens should significantly improve the visual and functional status of the patient. Discussed all risks, benefits, alternatives, and potential complications. Discussed the procedures and recovery. Patient desires to have surgery. A-scan ordered and performed today for intra-ocular lens calculations. The surgery will be performed in order to improve vision for driving, reading, and for eye examinations. Recommend phacoemulsification with intra-ocular lens. Recommend Dextenza  for post-operative pain and inflammation. Right Eye worse first. Dilates poorly - shugarcaine by protocol. Malyugin Ring. Omidira.  2.  Blepharitis is present - recommend regular lid cleaning.  3.  Discussed condition and symptoms and is considered a type of Dry Eye Disease called "mechanical dry eye" caused by excessive tissue on the eye that is rubbed by the eyelids.  4.  Observe; Artificial tears as needed for irritation.

## 2023-07-06 ENCOUNTER — Encounter (HOSPITAL_COMMUNITY): Payer: Self-pay | Admitting: Certified Registered"

## 2023-07-06 ENCOUNTER — Encounter (HOSPITAL_COMMUNITY): Admission: RE | Payer: Self-pay | Source: Home / Self Care

## 2023-07-06 ENCOUNTER — Ambulatory Visit (HOSPITAL_COMMUNITY): Admission: RE | Admit: 2023-07-06 | Payer: Medicare HMO | Source: Home / Self Care | Admitting: Ophthalmology

## 2023-07-06 SURGERY — PHACOEMULSIFICATION, CATARACT, WITH IOL INSERTION
Anesthesia: Monitor Anesthesia Care | Laterality: Right

## 2023-07-06 NOTE — Progress Notes (Signed)
 Patient did not show up at 0645 for pre-op for cataract surgery today.  Patient returned callback-states she is not having her cataract surgery today due to financial reasons.  She states she called the office and notified them.

## 2023-07-09 DIAGNOSIS — M545 Low back pain, unspecified: Secondary | ICD-10-CM | POA: Diagnosis not present

## 2023-07-09 DIAGNOSIS — M25562 Pain in left knee: Secondary | ICD-10-CM | POA: Diagnosis not present

## 2023-07-09 DIAGNOSIS — M25512 Pain in left shoulder: Secondary | ICD-10-CM | POA: Diagnosis not present

## 2023-07-11 ENCOUNTER — Ambulatory Visit (HOSPITAL_COMMUNITY)
Admission: RE | Admit: 2023-07-11 | Discharge: 2023-07-11 | Disposition: A | Discharge status: Home / Self Care | Source: Ambulatory Visit | Attending: Urology | Admitting: Urology

## 2023-07-11 DIAGNOSIS — K7689 Other specified diseases of liver: Secondary | ICD-10-CM | POA: Diagnosis not present

## 2023-07-11 DIAGNOSIS — N281 Cyst of kidney, acquired: Secondary | ICD-10-CM | POA: Insufficient documentation

## 2023-07-11 DIAGNOSIS — R399 Unspecified symptoms and signs involving the genitourinary system: Secondary | ICD-10-CM | POA: Insufficient documentation

## 2023-07-11 DIAGNOSIS — Z87442 Personal history of urinary calculi: Secondary | ICD-10-CM | POA: Diagnosis not present

## 2023-07-11 DIAGNOSIS — R3129 Other microscopic hematuria: Secondary | ICD-10-CM | POA: Insufficient documentation

## 2023-07-11 DIAGNOSIS — K5710 Diverticulosis of small intestine without perforation or abscess without bleeding: Secondary | ICD-10-CM | POA: Diagnosis not present

## 2023-07-11 LAB — POCT I-STAT CREATININE: Creatinine, Ser: 1.4 mg/dL — ABNORMAL HIGH (ref 0.44–1.00)

## 2023-07-11 MED ORDER — IOHEXOL 300 MG/ML  SOLN
125.0000 mL | Freq: Once | INTRAMUSCULAR | Status: AC | PRN
  Administered 2023-07-11: 80 mL via INTRAVENOUS

## 2023-07-12 ENCOUNTER — Ambulatory Visit: Payer: Self-pay | Admitting: Urology

## 2023-07-12 NOTE — Telephone Encounter (Signed)
-----   Message from Lauretta Ponto sent at 07/12/2023 10:25 AM EDT ----- Please notify patient: CT showed no acute findings.

## 2023-07-12 NOTE — Telephone Encounter (Signed)
 Patient is made aware via detail voiced message.

## 2023-07-25 ENCOUNTER — Other Ambulatory Visit (HOSPITAL_COMMUNITY)

## 2023-07-27 ENCOUNTER — Ambulatory Visit (HOSPITAL_COMMUNITY): Admit: 2023-07-27 | Payer: Medicare HMO | Admitting: Ophthalmology

## 2023-07-27 SURGERY — PHACOEMULSIFICATION, CATARACT, WITH IOL INSERTION
Anesthesia: Monitor Anesthesia Care | Laterality: Left

## 2023-08-07 DIAGNOSIS — I2782 Chronic pulmonary embolism: Secondary | ICD-10-CM | POA: Diagnosis not present

## 2023-08-07 DIAGNOSIS — Z809 Family history of malignant neoplasm, unspecified: Secondary | ICD-10-CM | POA: Diagnosis not present

## 2023-08-07 DIAGNOSIS — F411 Generalized anxiety disorder: Secondary | ICD-10-CM | POA: Diagnosis not present

## 2023-08-07 DIAGNOSIS — K5900 Constipation, unspecified: Secondary | ICD-10-CM | POA: Diagnosis not present

## 2023-08-07 DIAGNOSIS — M1990 Unspecified osteoarthritis, unspecified site: Secondary | ICD-10-CM | POA: Diagnosis not present

## 2023-08-07 DIAGNOSIS — Z833 Family history of diabetes mellitus: Secondary | ICD-10-CM | POA: Diagnosis not present

## 2023-08-07 DIAGNOSIS — Z8249 Family history of ischemic heart disease and other diseases of the circulatory system: Secondary | ICD-10-CM | POA: Diagnosis not present

## 2023-08-07 DIAGNOSIS — I129 Hypertensive chronic kidney disease with stage 1 through stage 4 chronic kidney disease, or unspecified chronic kidney disease: Secondary | ICD-10-CM | POA: Diagnosis not present

## 2023-08-07 DIAGNOSIS — E785 Hyperlipidemia, unspecified: Secondary | ICD-10-CM | POA: Diagnosis not present

## 2023-08-07 DIAGNOSIS — Z7901 Long term (current) use of anticoagulants: Secondary | ICD-10-CM | POA: Diagnosis not present

## 2023-08-07 DIAGNOSIS — N1831 Chronic kidney disease, stage 3a: Secondary | ICD-10-CM | POA: Diagnosis not present

## 2023-08-07 DIAGNOSIS — Z880 Allergy status to penicillin: Secondary | ICD-10-CM | POA: Diagnosis not present

## 2023-08-08 DIAGNOSIS — F411 Generalized anxiety disorder: Secondary | ICD-10-CM | POA: Diagnosis not present

## 2023-08-08 DIAGNOSIS — Z86711 Personal history of pulmonary embolism: Secondary | ICD-10-CM | POA: Diagnosis not present

## 2023-08-08 DIAGNOSIS — G609 Hereditary and idiopathic neuropathy, unspecified: Secondary | ICD-10-CM | POA: Diagnosis not present

## 2023-08-08 DIAGNOSIS — E876 Hypokalemia: Secondary | ICD-10-CM | POA: Diagnosis not present

## 2023-08-08 DIAGNOSIS — R809 Proteinuria, unspecified: Secondary | ICD-10-CM | POA: Diagnosis not present

## 2023-08-08 DIAGNOSIS — N3281 Overactive bladder: Secondary | ICD-10-CM | POA: Diagnosis not present

## 2023-08-08 DIAGNOSIS — I10 Essential (primary) hypertension: Secondary | ICD-10-CM | POA: Diagnosis not present

## 2023-08-08 DIAGNOSIS — K8689 Other specified diseases of pancreas: Secondary | ICD-10-CM | POA: Diagnosis not present

## 2023-08-08 DIAGNOSIS — E782 Mixed hyperlipidemia: Secondary | ICD-10-CM | POA: Diagnosis not present

## 2023-08-08 DIAGNOSIS — R944 Abnormal results of kidney function studies: Secondary | ICD-10-CM | POA: Diagnosis not present

## 2023-08-08 DIAGNOSIS — R350 Frequency of micturition: Secondary | ICD-10-CM | POA: Diagnosis not present

## 2023-08-08 DIAGNOSIS — R7303 Prediabetes: Secondary | ICD-10-CM | POA: Diagnosis not present

## 2023-08-08 DIAGNOSIS — Z9189 Other specified personal risk factors, not elsewhere classified: Secondary | ICD-10-CM | POA: Diagnosis not present

## 2023-08-08 DIAGNOSIS — N281 Cyst of kidney, acquired: Secondary | ICD-10-CM | POA: Diagnosis not present

## 2023-08-13 ENCOUNTER — Emergency Department (HOSPITAL_COMMUNITY)

## 2023-08-13 ENCOUNTER — Encounter (HOSPITAL_COMMUNITY): Payer: Self-pay

## 2023-08-13 ENCOUNTER — Emergency Department (HOSPITAL_COMMUNITY)
Admission: EM | Admit: 2023-08-13 | Discharge: 2023-08-13 | Disposition: A | Discharge status: Home / Self Care | Attending: Emergency Medicine | Admitting: Emergency Medicine

## 2023-08-13 ENCOUNTER — Other Ambulatory Visit: Payer: Self-pay

## 2023-08-13 DIAGNOSIS — R300 Dysuria: Secondary | ICD-10-CM | POA: Diagnosis not present

## 2023-08-13 DIAGNOSIS — K5750 Diverticulosis of both small and large intestine without perforation or abscess without bleeding: Secondary | ICD-10-CM | POA: Diagnosis not present

## 2023-08-13 DIAGNOSIS — R509 Fever, unspecified: Secondary | ICD-10-CM | POA: Insufficient documentation

## 2023-08-13 DIAGNOSIS — R350 Frequency of micturition: Secondary | ICD-10-CM | POA: Insufficient documentation

## 2023-08-13 DIAGNOSIS — R109 Unspecified abdominal pain: Secondary | ICD-10-CM | POA: Diagnosis not present

## 2023-08-13 DIAGNOSIS — N281 Cyst of kidney, acquired: Secondary | ICD-10-CM | POA: Diagnosis not present

## 2023-08-13 DIAGNOSIS — D649 Anemia, unspecified: Secondary | ICD-10-CM | POA: Insufficient documentation

## 2023-08-13 DIAGNOSIS — Z7901 Long term (current) use of anticoagulants: Secondary | ICD-10-CM | POA: Diagnosis not present

## 2023-08-13 LAB — URINALYSIS, W/ REFLEX TO CULTURE (INFECTION SUSPECTED)
Bacteria, UA: NONE SEEN
Bilirubin Urine: NEGATIVE
Glucose, UA: NEGATIVE mg/dL
Ketones, ur: NEGATIVE mg/dL
Nitrite: NEGATIVE
Protein, ur: NEGATIVE mg/dL
Specific Gravity, Urine: 1.011 (ref 1.005–1.030)
pH: 6 (ref 5.0–8.0)

## 2023-08-13 LAB — CBC WITH DIFFERENTIAL/PLATELET
Abs Immature Granulocytes: 0.01 K/uL (ref 0.00–0.07)
Basophils Absolute: 0 K/uL (ref 0.0–0.1)
Basophils Relative: 1 %
Eosinophils Absolute: 0 K/uL (ref 0.0–0.5)
Eosinophils Relative: 0 %
HCT: 36.7 % (ref 36.0–46.0)
Hemoglobin: 11.8 g/dL — ABNORMAL LOW (ref 12.0–15.0)
Immature Granulocytes: 0 %
Lymphocytes Relative: 34 %
Lymphs Abs: 1.6 K/uL (ref 0.7–4.0)
MCH: 32.4 pg (ref 26.0–34.0)
MCHC: 32.2 g/dL (ref 30.0–36.0)
MCV: 100.8 fL — ABNORMAL HIGH (ref 80.0–100.0)
Monocytes Absolute: 0.4 K/uL (ref 0.1–1.0)
Monocytes Relative: 8 %
Neutro Abs: 2.7 K/uL (ref 1.7–7.7)
Neutrophils Relative %: 57 %
Platelets: 228 K/uL (ref 150–400)
RBC: 3.64 MIL/uL — ABNORMAL LOW (ref 3.87–5.11)
RDW: 13.1 % (ref 11.5–15.5)
WBC: 4.8 K/uL (ref 4.0–10.5)
nRBC: 0 % (ref 0.0–0.2)

## 2023-08-13 LAB — BASIC METABOLIC PANEL WITH GFR
Anion gap: 7 (ref 5–15)
BUN: 21 mg/dL (ref 8–23)
CO2: 26 mmol/L (ref 22–32)
Calcium: 9 mg/dL (ref 8.9–10.3)
Chloride: 106 mmol/L (ref 98–111)
Creatinine, Ser: 0.83 mg/dL (ref 0.44–1.00)
GFR, Estimated: >60 mL/min (ref >60)
Glucose, Bld: 108 mg/dL — ABNORMAL HIGH (ref 70–99)
Potassium: 3.4 mmol/L — ABNORMAL LOW (ref 3.5–5.1)
Sodium: 139 mmol/L (ref 135–145)

## 2023-08-13 MED ORDER — SODIUM CHLORIDE 0.9 % IV BOLUS
1000.0000 mL | Freq: Once | INTRAVENOUS | Status: AC
  Administered 2023-08-13: 1000 mL via INTRAVENOUS

## 2023-08-13 NOTE — Discharge Instructions (Addendum)
 You were seen in the emergency department for urinary symptoms and lower abdominal pressure.  You had blood work urinalysis and a CAT scan of your abdomen and pelvis that did not show an obvious explanation for your symptoms.  There were no clear signs of urinary tract infection.  Please follow-up with your primary care doctor and your urology team.  Return if any worsening or concerning symptoms

## 2023-08-13 NOTE — ED Provider Notes (Signed)
 [place] EMERGENCY DEPARTMENT AT [facility] Provider Note   CSN: [id number] Arrival date & time: 08/13/23  [id number]     Patient presents with: Urinary Frequency   Crystal Cooke is a 73 y.o. female.  sHe is presenting 1 month of dysuria, subjective fevers feeling dehydrated.  She said she has been to urgent care twice and been on a course of antibiotics.  Has seen her primary care doctor for this.  Still has symptoms.  Felt worse so wanted to come here for further evaluation.  Of note she saw urology about a month ago for hematuria and had a CT that was negative for any acute findings.  Last urine culture in our system was from 2024 and was negative.  She denies any vaginal bleeding.   The history is provided by the patient.  Urinary Frequency This is a new problem. The current episode started more than 1 week ago. The problem occurs constantly. The problem has not changed since onset.Associated symptoms include abdominal pain. Pertinent negatives include no chest pain, no headaches and no shortness of breath. Nothing aggravates the symptoms. Nothing relieves the symptoms.       Prior to Admission medications   Medication Sig Start Date End Date Taking? Authorizing Provider  acetaminophen  (TYLENOL ) 500 MG tablet Take 1,000 mg by mouth every 6 (six) hours as needed for moderate pain or mild pain.    [provider]  ALPRAZolam  (XANAX ) 0.5 MG tablet Take 0.5 mg by mouth daily as needed for anxiety. 06/28/20   [provider]  Ascorbic Acid (VITAMIN C) 1000 MG tablet Take 1,000 mg by mouth daily.    [provider]  CARTIA  XT 300 MG 24 hr capsule Take 1 capsule by mouth once daily in the morning Patient taking differently: Take 300 mg by mouth daily. 05/12/19   Cresenzo, John V, MD  Cholecalciferol (VITAMIN D ) 50 MCG (2000 UT) CAPS Take 2,000 Units by mouth daily.    [provider]  Garlic 1000 MG CAPS Take 1,000 mg by mouth daily.    [provider]  polyethylene glycol-electrolytes (TRILYTE) 420 g solution Take 4,000 mLs by mouth as directed. 06/20/21   Rourk, Lamar CHRISTELLA, MD  potassium chloride  SA (KLOR-CON  M) 20 MEQ tablet Take 1 tablet (20 mEq total) by mouth daily. 02/14/22   Okey Vina GAILS, MD  rosuvastatin (CRESTOR) 5 MG tablet Take 5 mg by mouth daily.    [provider]  tiZANidine (ZANAFLEX) 2 MG tablet Take 2 mg by mouth every 6 (six) hours as needed for muscle spasms. 05/27/21   [provider]  traMADol (ULTRAM) 50 MG tablet Take 50 mg by mouth every 8 (eight) hours as needed for severe pain. 01/05/20   [provider]  XARELTO  10 MG TABS tablet Take 10 mg by mouth daily. 08/26/20   [provider]    Allergies: Levaquin [levofloxacin], Oxycodone , and Statins    Review of Systems  Constitutional:  Positive for fever.  Respiratory:  Negative for shortness of breath.   Cardiovascular:  Negative for chest pain.  Gastrointestinal:  Positive for abdominal pain.  Genitourinary:  Positive for dysuria and frequency. Negative for vaginal bleeding.  Neurological:  Negative for headaches.    Updated Vital Signs BP (!) 141/81 (BP Location: Right Arm)   Pulse 78   Temp 98.2 F (36.8 C) (Oral)   Resp 18   Ht 5' 2 (1.575 m)   Wt 54.4 kg  LMP 02/07/1999 (Approximate)   SpO2 96%   BMI 21.95 kg/m   Physical Exam Vitals and nursing note reviewed.  Constitutional:      General: She is not in acute distress.    Appearance: Normal appearance. She is well-developed.  HENT:     Head: Normocephalic and atraumatic.  Eyes:     Conjunctiva/sclera: Conjunctivae normal.  Cardiovascular:     Rate and Rhythm: Normal rate and regular rhythm.     Heart sounds: No murmur heard. Pulmonary:     Effort: Pulmonary effort is normal. No respiratory distress.     Breath sounds: Normal breath sounds. No stridor. No wheezing.  Abdominal:     Palpations: Abdomen is soft.     Tenderness: There is no  abdominal tenderness. There is no guarding or rebound.  Musculoskeletal:        General: No tenderness or deformity. Normal range of motion.     Cervical back: Neck supple.  Skin:    General: Skin is warm and dry.  Neurological:     General: No focal deficit present.     Mental Status: She is alert.     GCS: GCS eye subscore is 4. GCS verbal subscore is 5. GCS motor subscore is 6.     (all labs ordered are listed, but only abnormal results are displayed) Labs Reviewed  URINALYSIS, W/ REFLEX TO CULTURE (INFECTION SUSPECTED) - Abnormal; Notable for the following components:      Result Value   Hgb urine dipstick MODERATE (*)    Leukocytes,Ua TRACE (*)    All other components within normal limits  BASIC METABOLIC PANEL WITH GFR - Abnormal; Notable for the following components:   Potassium 3.4 (*)    Glucose, Bld 108 (*)    All other components within normal limits  CBC WITH DIFFERENTIAL/PLATELET - Abnormal; Notable for the following components:   RBC 3.64 (*)    Hemoglobin 11.8 (*)    MCV 100.8 (*)    All other components within normal limits    EKG: None  Radiology: CT Renal Stone Study Result Date: 08/13/2023 CLINICAL DATA:  Abdominal/flank pain, stone suspected EXAM: CT ABDOMEN AND PELVIS WITHOUT CONTRAST TECHNIQUE: Multidetector CT imaging of the abdomen and pelvis was performed following the standard protocol without IV contrast. RADIATION DOSE REDUCTION: This exam was performed according to the departmental dose-optimization program which includes automated exposure control, adjustment of the mA and/or kV according to patient size and/or use of iterative reconstruction technique. COMPARISON:  07/11/2023. FINDINGS: Lower chest: No acute abnormality. Hepatobiliary: Stable multiple simple cysts with the largest in segment VI measuring up to 2.9 x 2.0 cm. Gallbladder is unremarkable. No biliary dilatation. 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 Remainder of the pancreas is appearance. No pancreatic ductal dilatation or surrounding inflammatory changes. Spleen: Surgically absent. Adrenals/Urinary Tract: Adrenal glands are unremarkable. No urolithiasis or hydronephrosis. Stable right renal cysts. Bladder is unremarkable. Stomach/Bowel: Stomach is within normal limits. Small duodenal diverticulum. Appendix appears normal. No evidence of obstruction or focal inflammatory changes. Mild sigmoid colonic diverticulosis without evidence of acute diverticulitis. Vascular/Lymphatic: Abdominal aorta is in caliber with atherosclerotic calcification. No enlarged abdominal or pelvic lymph nodes by size criteria. Reproductive: Uterus and bilateral adnexa are unremarkable. Other: No abdominopelvic ascites.  No intraperitoneal free air. Musculoskeletal: No acute osseous abnormality. No suspicious osseous lesion. Degenerative changes of the lumbar spine. IMPRESSION: 1. No acute localizing findings in the abdomen or pelvis. 2.  Aortic  Atherosclerosis (ICD10-I70.0). Electronically Signed   By: Harrietta Sherry M.D.   On: 08/13/2023 09:49     Procedures   Medications Ordered in the ED  sodium chloride  0.9 % bolus 1,000 mL (0 mLs Intravenous Stopped 08/13/23 1016)                                    Medical Decision Making Amount and/or Complexity of Data Reviewed Labs: ordered. Radiology: ordered.   This patient complains of urinary frequency; this involves an extensive number of treatment Options and is a complaint that carries with it a high risk of complications and morbidity. The differential includes UTI, cystitis, bladder dysfunction, renal colic  I ordered, reviewed and interpreted labs, which included CBC with mildly low hemoglobin, chemistries with normal renal function, urinalysis without signs of infection I ordered medication IV fluids and reviewed PMP when indicated. I ordered imaging studies which included CT renal and I  independently    visualized and interpreted imaging which showed no acute findings Previous records obtained and reviewed in epic including prior urology notes Cardiac monitoring reviewed, sinus rhythm Social determinants considered, no significant barriers Critical Interventions: None  After the interventions stated above, I reevaluated the patient and found well-appearing in no distress Admission and further testing considered, no indications for admission or further workup.  Asked patient to follow-up with her PCP and urology for continued workup and management.  Return instructions discussed      Final diagnoses:  Dysuria    ED Discharge Orders     None          Towana Ozell BROCKS, MD 08/13/23 1700

## 2023-08-13 NOTE — ED Triage Notes (Signed)
 Patient from home for urinary frequency and pressure in her bladder that started a couple of weeks ago. Believes she has a UTI; was seen by PCP but has no results yet. Upon arrival to ER, patient is alert and oriented, ambu

## 2023-08-23 ENCOUNTER — Other Ambulatory Visit: Payer: Self-pay | Admitting: Internal Medicine

## 2023-09-12 ENCOUNTER — Ambulatory Visit: Admitting: Student

## 2023-10-05 DIAGNOSIS — R350 Frequency of micturition: Secondary | ICD-10-CM | POA: Diagnosis not present

## 2023-10-05 DIAGNOSIS — B370 Candidal stomatitis: Secondary | ICD-10-CM | POA: Diagnosis not present

## 2023-10-05 DIAGNOSIS — N390 Urinary tract infection, site not specified: Secondary | ICD-10-CM | POA: Diagnosis not present

## 2023-10-29 DIAGNOSIS — E559 Vitamin D deficiency, unspecified: Secondary | ICD-10-CM | POA: Diagnosis not present

## 2023-10-29 DIAGNOSIS — E782 Mixed hyperlipidemia: Secondary | ICD-10-CM | POA: Diagnosis not present

## 2023-10-29 DIAGNOSIS — R7303 Prediabetes: Secondary | ICD-10-CM | POA: Diagnosis not present

## 2023-10-30 NOTE — Progress Notes (Deleted)
   Cardiology Office Note    Date:  10/30/2023  ID:  Crystal Cooke, Crystal Cooke 1950/02/10, MRN [id number] Cardiologist: Vina Gull, MD { :  History of Present Illness:    Crystal Cooke is a 73 y.o. female with past medical history of paroxysmal atrial fibrillation, atypical chest pain, HTN, HLD and history of PE who presents to the office today for overdue follow-up.  She was last exam by Dr. Gull in 02/2022 and reported occasional episodes of a sharp left-sided chest pain but was pleuritic by description and no associated with exertion.  She had remained on long-term anticoagulation given her recurrent, unprovoked PEs.  She reported myalgias with Crestor and was switched to atorvastatin  20 mg daily with plans to obtain follow-up labs in 2 months if able to tolerate.  She has scheduled follow-up in the interim but missed her appointment has not been evaluated since.  ROS: ***  Studies Reviewed:   EKG: EKG is*** ordered today and demonstrates ***   EKG Interpretation Date/Time:    Ventricular Rate:    PR Interval:    QRS Duration:    QT Interval:    QTC Calculation:   R Axis:      Text Interpretation:           Risk Assessment/Calculations:   {Does this patient have ATRIAL FIBRILLATION?:[phone]} No BP recorded.  {Refresh Note OR Click here to enter BP  :1}***         Physical Exam:   VS:  LMP 02/07/1999 (Approximate)    Wt Readings from Last 3 Encounters:  08/13/23 120 lb (54.4 kg)  06/29/23 120 lb (54.4 kg)  02/14/22 125 lb 12.8 oz (57.1 kg)     GEN: Well nourished, well developed in no acute distress NECK: No JVD; No carotid bruits CARDIAC: ***RRR, no murmurs, rubs, gallops RESPIRATORY:  Clear to auscultation without rales, wheezing or rhonchi  ABDOMEN: Appears non-distended. No obvious abdominal masses. EXTREMITIES: No clubbing or cyanosis. No edema.  Distal pedal pulses are 2+ bilaterally.   Assessment and Plan:      {Are you ordering a CV Procedure  (e.g. stress test, cath, DCCV, TEE, etc)?   Press F2        :[phone]}   Signed, Laymon [name], PA-C

## 2023-10-31 ENCOUNTER — Ambulatory Visit: Admitting: Student

## 2023-11-22 DIAGNOSIS — B370 Candidal stomatitis: Secondary | ICD-10-CM | POA: Diagnosis not present

## 2023-11-22 DIAGNOSIS — Z79899 Other long term (current) drug therapy: Secondary | ICD-10-CM | POA: Diagnosis not present

## 2023-11-22 DIAGNOSIS — J302 Other seasonal allergic rhinitis: Secondary | ICD-10-CM | POA: Diagnosis not present

## 2023-11-26 ENCOUNTER — Ambulatory Visit: Admitting: Urology

## 2023-11-26 ENCOUNTER — Encounter: Payer: Self-pay | Admitting: Urology

## 2023-11-26 DIAGNOSIS — R3129 Other microscopic hematuria: Secondary | ICD-10-CM | POA: Diagnosis not present

## 2023-11-26 DIAGNOSIS — R300 Dysuria: Secondary | ICD-10-CM | POA: Diagnosis not present

## 2023-11-26 DIAGNOSIS — R350 Frequency of micturition: Secondary | ICD-10-CM | POA: Diagnosis not present

## 2023-11-26 DIAGNOSIS — R3915 Urgency of urination: Secondary | ICD-10-CM | POA: Diagnosis not present

## 2023-11-26 LAB — URINALYSIS, ROUTINE W REFLEX MICROSCOPIC
Bilirubin, UA: NEGATIVE
Glucose, UA: NEGATIVE
Ketones, UA: NEGATIVE
Leukocytes,UA: NEGATIVE
Nitrite, UA: NEGATIVE
Specific Gravity, UA: 1.01 (ref 1.005–1.030)
Urobilinogen, Ur: 0.2 mg/dL (ref 0.2–1.0)
pH, UA: 6 (ref 5.0–7.5)

## 2023-11-26 LAB — MICROSCOPIC EXAMINATION: Bacteria, UA: NONE SEEN

## 2023-11-26 MED ORDER — ESTRADIOL 0.01 % VA CREA: TOPICAL_CREAM | VAGINAL | 11 refills | Status: AC

## 2023-11-26 NOTE — Progress Notes (Unsigned)
 11/26/2023 10:51 AM   Crystal Cooke Agent 1950-03-10 [id number]  Referring provider: Shona Norleen PEDLAR, MD [address] [name],  [place] [address]  No chief complaint on file.   HPI:  New patient for me-she saw Dr. Roseann and PA Lauraine-  1) MH -  microhematuria.  microhematuria since 2009 with multiple negative work ups. She has never used tobacco products. 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 11/22/2021 CT abdomen/pelvis w/ contrast at [facility] showed no GU stones, masses, or hydronephrosis. 2025 CT a/p benign x 2.   2) Right renal cyst - 16 mm BOS 2 dx at [facility] in 2020.  A 2023 CT with 18 renal cyst with thin septation.  A June and July 2025 CT with 17 mm RLP cyst - benign x 2.   3)frequency, urgency, and nocturia 4-5 times. PVR 0. No meds.  She was prescribed Myrbetriq  but never started it.  No prior hysterectomy or pelvic surgery.  Today, seen for the above. No h/o breast cancer, uterine ca. She complains of dysuria, frequency and urgency. She took NF and it did not help. UA today with 11-30 red cells per high-power field, no bacteria.  Urine culture.   PMH: Past Medical History:  Diagnosis Date   Allergy    Anxiety    Atrial fibrillation (HCC)    h/o a fib,  NSR with last EKG, has never seen a cardiologist per pt    DDD (degenerative disc disease), lumbar    Hypercholesteremia    Hypertension    Leg DVT (deep venous thromboembolism), acute, left (HCC) 01/09/2018    Surgical History: Past Surgical History:  Procedure Laterality Date   COLONOSCOPY  05/05/14   Dr.Benson- normal colonoscopy, retroflexed views revealed no abnormalities.    COLONOSCOPY WITH PROPOFOL  N/A 07/25/2021   Procedure: COLONOSCOPY WITH PROPOFOL ;  Surgeon: Shaaron Lamar CHRISTELLA, MD;  Location: [facility];  Service: Endoscopy;  Laterality: N/A;  9:45am   ESOPHAGOGASTRODUODENOSCOPY  05/05/14   Dr.Benson- normal EGD, retroflexed views revealed no abnormalities    EUS N/A  09/24/2014   Procedure: UPPER ENDOSCOPIC ULTRASOUND (EUS) RADIAL;  Surgeon: Toribio SHAUNNA Cedar, MD;  Location: [facility];  Service: Endoscopy;  Laterality: N/A;   KNEE ARTHROSCOPY Right 01/05/2017   Procedure: RIGHT KNEE ARTHROSCOPY, PARTIAL LATERAL MENISCECTOMY,;  Surgeon: Barbarann Oneil BROCKS, MD;  Location: [facility];  Service: Orthopedics;  Laterality: Right;   none     PANCREAS SURGERY     partial    Home Medications:  Allergies as of 11/26/2023       Reactions   Levaquin [levofloxacin] Other (See Comments)   tendon damage   Oxycodone  Nausea Only   Dizziness and headaches   Statins Other (See Comments)   takes the taste out of my mouth        Medication List        Accurate as of November 26, 2023 10:51 AM. If you have any questions, ask your nurse or doctor.          acetaminophen  500 MG tablet Commonly known as: TYLENOL  Take 1,000 mg by mouth every 6 (six) hours as needed for moderate pain or mild pain.   ALPRAZolam  0.5 MG tablet Commonly known as: XANAX  Take 0.5 mg by mouth daily as needed for anxiety.   Cartia  XT 300 MG 24 hr capsule Generic drug: diltiazem  Take 1 capsule by mouth once daily in the morning What changed:  how much to take when to take this   Garlic 1000 MG Caps Take 1,000 mg by mouth daily.   polyethylene glycol-electrolytes 420 g solution Commonly known as: TriLyte Take 4,000 mLs by mouth as directed.   potassium chloride  SA 20 MEQ tablet Commonly known as: KLOR-CON  M Take 1 tablet by mouth once daily   rosuvastatin 5 MG tablet Commonly known as: CRESTOR Take 5 mg by mouth daily.   tiZANidine 2 MG tablet Commonly known as: ZANAFLEX Take 2 mg by mouth every 6 (six) hours as needed for muscle spasms.   traMADol 50 MG tablet Commonly known as: ULTRAM Take 50 mg by mouth every 8 (eight) hours as needed for severe pain.   vitamin C 1000 MG tablet Take 1,000 mg by mouth daily.   Vitamin D  50 MCG (2000 UT)  Caps Take 2,000 Units by mouth daily.   Xarelto  10 MG Tabs tablet Generic drug: rivaroxaban  Take 10 mg by mouth daily.        Allergies:  Allergies  Allergen Reactions   Levaquin [Levofloxacin] Other (See Comments)    tendon damage   Oxycodone  Nausea Only    Dizziness and headaches   Statins Other (See Comments)    takes the taste out of my mouth    Family History: Family History  Problem Relation Age of Onset   Lung cancer Mother            Hypertension Mother    Cancer Mother    Diabetes Father    Hypertension Father    Lung cancer Father        deceased age 80   Cancer Father    Colon cancer Sister        deceased 12-31-14, age 78   Pancreatic cancer Neg Hx     Social History:  reports that she has never smoked. She has never used smokeless tobacco. She reports that she does not drink alcohol and does not use drugs.   Physical Exam: LMP 02/07/1999 (Approximate)   Constitutional:  Alert and oriented, No acute distress. HEENT: [place] AT, moist mucus membranes.  Trachea midline, no masses. Cardiovascular: No clubbing, cyanosis, or edema. Respiratory: Normal respiratory effort, no increased work of breathing. GI: Abdomen is soft, nontender, nondistended, no abdominal masses GU: No CVA tenderness Skin: No rashes, bruises or suspicious lesions. Neurologic: Grossly intact, no focal deficits, moving all 4 extremities. Psychiatric: Normal mood and affect.  Laboratory Data: Lab Results  Component Value Date   WBC 4.8 08/13/2023   HGB 11.8 (L) 08/13/2023   HCT 36.7 08/13/2023   MCV 100.8 (H) 08/13/2023   PLT 228 08/13/2023    Lab Results  Component Value Date   CREATININE 0.83 08/13/2023    No results found for: PSA  No results found for: TESTOSTERONE  No results found for: HGBA1C  Urinalysis    Component Value Date/Time   COLORURINE YELLOW 08/13/2023 0800   APPEARANCEUR CLEAR 08/13/2023 0800   APPEARANCEUR Clear 12/15/2022 0828   LABSPEC  1.011 08/13/2023 0800   PHURINE 6.0 08/13/2023 0800   GLUCOSEU NEGATIVE 08/13/2023 0800   HGBUR MODERATE (A) 08/13/2023 0800   BILIRUBINUR NEGATIVE 08/13/2023 0800   BILIRUBINUR Negative 12/15/2022 0828   KETONESUR NEGATIVE 08/13/2023 0800   PROTEINUR NEGATIVE 08/13/2023 0800   UROBILINOGEN 0.2 03/02/2016 1359   UROBILINOGEN 0.2 10/20/2013 1513   NITRITE NEGATIVE 08/13/2023 0800   LEUKOCYTESUR TRACE (A) 08/13/2023 0800    Lab Results  Component Value Date  LABMICR See below: 12/15/2022   WBCUA 6-10 (A) 12/15/2022   LABEPIT 0-10 12/15/2022   MUCUS Present (A) 12/15/2022   BACTERIA NONE SEEN 08/13/2023    Pertinent Imaging:   Results for orders placed during the hospital encounter of 07/11/23  CT HEMATURIA WORKUP  Narrative CLINICAL DATA:  Microscopic hematuria.  EXAM: CT ABDOMEN AND PELVIS WITHOUT AND WITH CONTRAST  TECHNIQUE: Multidetector CT imaging of the abdomen and pelvis was performed following the standard protocol before and following the bolus administration of intravenous contrast.  RADIATION DOSE REDUCTION: This exam was performed according to the departmental dose-optimization program which includes automated exposure control, adjustment of the mA and/or kV according to patient size and/or use of iterative reconstruction technique.  CONTRAST:  80mL OMNIPAQUE  IOHEXOL  300 MG/ML  SOLN  COMPARISON:  CT scan abdomen and pelvis from 05/11/2016.  FINDINGS: Lower chest: There are dependent changes in the visualized lung bases. No overt consolidation. No pleural effusion. The heart is normal in size. No pericardial effusion.  Hepatobiliary: The liver is normal in size. Non-cirrhotic configuration. No suspicious mass. There are multiple simple cysts throughout the liver with largest in the right hepatic lobe, segment 6 measuring up to 2.0 x 2.9 cm. No intrahepatic or extrahepatic bile duct dilation. No calcified gallstones. Normal gallbladder  wall thickness. No pericholecystic inflammatory changes.  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 Remaining pancreas appears within normal limits. No focal mass. Mild prominence of main pancreatic duct is similar to the prior study. No peripancreatic fat stranding.  Spleen: Surgically absent.  Adrenals/Urinary Tract: Adrenal glands are unremarkable.  Non-contrast images: No radiopaque urinary tract calculi.  Kidneys: Symmetric enhancement. No suspicious renal mass. There is a 1.7 x 2.2 cm simple cyst in the right kidney lower pole, laterally.  Urinary Tract Opacification: Adequate.  Collecting Systems and Ureters: No filling defects, masses, strictures, or areas of abnormal dilatation.  Urinary Bladder: No filling defect, wall thickening, or mass.  Stomach/Bowel: There is a small diverticulum arising from the third part of duodenum. No disproportionate dilation of the small or large bowel loops. No evidence of abnormal bowel wall thickening or inflammatory changes. The appendix is unremarkable.  Vascular/Lymphatic: No ascites or pneumoperitoneum. No abdominal or pelvic lymphadenopathy, by size criteria. No aneurysmal dilation of the major abdominal arteries. There are mild peripheral atherosclerotic vascular calcifications of the aorta and its major branches.  Reproductive: Not well evaluated on the CT scan exam. Normal-size anteverted uterus noted. No large adnexal mass seen.  Other: There is a tiny fat containing umbilical hernia. The soft tissues and abdominal wall are otherwise unremarkable.  Musculoskeletal: No suspicious osseous lesions. There are mild - moderate multilevel degenerative changes in the visualized spine.  IMPRESSION: 1. No nephroureterolithiasis or obstructive uropathy. No suspicious renal, ureteric or urinary bladder mass. 2. Multiple other nonacute observations, as described above.  Aortic Atherosclerosis  (ICD10-I70.0).   Electronically Signed By: Ree Molt M.D. On: 07/11/2023 19:22  Results for orders placed during the hospital encounter of 08/13/23  CT Renal Stone Study  Narrative CLINICAL DATA:  Abdominal/flank pain, stone suspected  EXAM: CT ABDOMEN AND PELVIS WITHOUT CONTRAST  TECHNIQUE: Multidetector CT imaging of the abdomen and pelvis was performed following the standard protocol without IV contrast.  RADIATION DOSE REDUCTION: This exam was performed according to the departmental dose-optimization program which includes automated exposure control, adjustment of the mA and/or kV according to patient size and/or use of iterative reconstruction technique.  COMPARISON:  07/11/2023.  FINDINGS:  Lower chest: No acute abnormality.  Hepatobiliary: Stable multiple simple cysts with the largest in segment VI measuring up to 2.9 x 2.0 cm. Gallbladder is unremarkable. No biliary dilatation.  Pancreas: Postoperative changes related to surgical resection of the pancreatic tail. Remainder of the pancreas is appearance. No pancreatic ductal dilatation or surrounding inflammatory changes.  Spleen: Surgically absent.  Adrenals/Urinary Tract: Adrenal glands are unremarkable. No urolithiasis or hydronephrosis. Stable right renal cysts. Bladder is unremarkable.  Stomach/Bowel: Stomach is within normal limits. Small duodenal diverticulum. Appendix appears normal. No evidence of obstruction or focal inflammatory changes. Mild sigmoid colonic diverticulosis without evidence of acute diverticulitis.  Vascular/Lymphatic: Abdominal aorta is in caliber with atherosclerotic calcification. No enlarged abdominal or pelvic lymph nodes by size criteria.  Reproductive: Uterus and bilateral adnexa are unremarkable.  Other: No abdominopelvic ascites.  No intraperitoneal free air.  Musculoskeletal: No acute osseous abnormality. No suspicious osseous lesion. Degenerative changes of  the lumbar spine.  IMPRESSION: 1. No acute localizing findings in the abdomen or pelvis. 2.  Aortic Atherosclerosis (ICD10-I70.0).   Electronically Signed By: Harrietta Sherry M.D. On: 08/13/2023 09:49   Assessment & Plan:    Microscopic hematuria-given the dysuria and no bacteria on the UA recommended cystoscopy and exam.  We discussed malignant and benign causes of her symptoms of microscopic hematuria and she certainly needs repeat cystoscopy.  Frequency urgency-we discussed the GSM guidelines and recommendation for TVE.  We discussed concerns with hormonal therapy such as cancer, blood clot heart attack among others but no definite association and low risk when topical.  Urine sent for culture.  No follow-ups on file.  Donnice Brooks, MD  [facility]  [address] [place], [place] [address] [phone]

## 2023-11-28 ENCOUNTER — Ambulatory Visit: Payer: Self-pay

## 2023-11-28 LAB — URINE CULTURE: Organism ID, Bacteria: NO GROWTH

## 2023-11-29 DIAGNOSIS — Z23 Encounter for immunization: Secondary | ICD-10-CM | POA: Diagnosis not present

## 2023-12-24 NOTE — Progress Notes (Deleted)
   Cardiology Office Note    Date:  12/24/2023  ID:  [name], [name] 04/09/1950, MRN [id number] Cardiologist: Vina Gull, MD { :  History of Present Illness:    Crystal Cooke is a 73 y.o. female with past medical history of paroxysmal atrial fibrillation, atypical chest pain, HTN, HLD and history of PE who presents to the office today for overdue follow-up.   She was last examined by Dr. Gull in 02/2022 and reported occasional episodes of a sharp left-sided chest pain but was pleuritic by description and not associated with exertion. 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 She reported myalgias with Crestor and was switched to Atorvastatin  20 mg daily with plans to obtain follow-up labs in 2 months if able to tolerate.    ROS: ***  Studies Reviewed:   EKG: EKG is*** ordered today and demonstrates ***   EKG Interpretation Date/Time:    Ventricular Rate:    PR Interval:    QRS Duration:    QT Interval:    QTC Calculation:   R Axis:      Text Interpretation:           Risk Assessment/Calculations:   {Does this patient have ATRIAL FIBRILLATION?[phone]} No BP recorded.  {Refresh Note OR Click here to enter BP  :1}***         Physical Exam:   VS:  LMP 02/07/1999 (Approximate)    Wt Readings from Last 3 Encounters:  08/13/23 120 lb (54.4 kg)  06/29/23 120 lb (54.4 kg)  02/14/22 125 lb 12.8 oz (57.1 kg)     GEN: Well nourished, well developed in no acute distress NECK: No JVD; No carotid bruits CARDIAC: ***RRR, no murmurs, rubs, gallops RESPIRATORY:  Clear to auscultation without rales, wheezing or rhonchi  ABDOMEN: Appears non-distended. No obvious abdominal masses. EXTREMITIES: No clubbing or cyanosis. No edema.  Distal pedal pulses are 2+ bilaterally.   Assessment and Plan:      {Are you ordering a CV Procedure (e.g. stress test, cath, DCCV, TEE, etc)?   Press F2        :[phone]}   Signed, Crystal [name], PA-C

## 2023-12-25 ENCOUNTER — Ambulatory Visit: Attending: Student | Admitting: Student

## 2024-02-11 ENCOUNTER — Other Ambulatory Visit: Admitting: Urology

## 2024-04-21 ENCOUNTER — Other Ambulatory Visit: Admitting: Urology
